# Patient Record
Sex: Female | Born: 1975 | Hispanic: Yes | Marital: Married | Smoking: Never smoker
Health system: Southern US, Community
[De-identification: ages and names within clinical notes are randomized; demographics above are authoritative.]

## PROBLEM LIST (undated history)

## (undated) ENCOUNTER — Inpatient Hospital Stay (HOSPITAL_COMMUNITY): Payer: Self-pay

## (undated) DIAGNOSIS — T7840XA Allergy, unspecified, initial encounter: Secondary | ICD-10-CM

## (undated) DIAGNOSIS — N883 Incompetence of cervix uteri: Secondary | ICD-10-CM

## (undated) DIAGNOSIS — K589 Irritable bowel syndrome without diarrhea: Secondary | ICD-10-CM

## (undated) DIAGNOSIS — M1990 Unspecified osteoarthritis, unspecified site: Secondary | ICD-10-CM

## (undated) DIAGNOSIS — E282 Polycystic ovarian syndrome: Secondary | ICD-10-CM

## (undated) DIAGNOSIS — D649 Anemia, unspecified: Secondary | ICD-10-CM

## (undated) DIAGNOSIS — H8110 Benign paroxysmal vertigo, unspecified ear: Secondary | ICD-10-CM

## (undated) DIAGNOSIS — N979 Female infertility, unspecified: Secondary | ICD-10-CM

## (undated) DIAGNOSIS — H209 Unspecified iridocyclitis: Secondary | ICD-10-CM

## (undated) DIAGNOSIS — E119 Type 2 diabetes mellitus without complications: Secondary | ICD-10-CM

## (undated) DIAGNOSIS — I10 Essential (primary) hypertension: Secondary | ICD-10-CM

## (undated) DIAGNOSIS — E039 Hypothyroidism, unspecified: Secondary | ICD-10-CM

## (undated) DIAGNOSIS — F329 Major depressive disorder, single episode, unspecified: Secondary | ICD-10-CM

## (undated) DIAGNOSIS — K7689 Other specified diseases of liver: Secondary | ICD-10-CM

## (undated) DIAGNOSIS — F3289 Other specified depressive episodes: Secondary | ICD-10-CM

## (undated) HISTORY — DX: Irritable bowel syndrome, unspecified: K58.9

## (undated) HISTORY — DX: Benign paroxysmal vertigo, unspecified ear: H81.10

## (undated) HISTORY — DX: Essential (primary) hypertension: I10

## (undated) HISTORY — DX: Allergy, unspecified, initial encounter: T78.40XA

## (undated) HISTORY — DX: Hypothyroidism, unspecified: E03.9

## (undated) HISTORY — DX: Unspecified iridocyclitis: H20.9

## (undated) HISTORY — DX: Other specified diseases of liver: K76.89

## (undated) HISTORY — DX: Unspecified osteoarthritis, unspecified site: M19.90

## (undated) HISTORY — DX: Major depressive disorder, single episode, unspecified: F32.9

## (undated) HISTORY — DX: Type 2 diabetes mellitus without complications: E11.9

## (undated) HISTORY — DX: Anemia, unspecified: D64.9

## (undated) HISTORY — DX: Other specified depressive episodes: F32.89

---

## 2005-04-21 ENCOUNTER — Ambulatory Visit: Payer: Self-pay | Admitting: Obstetrics and Gynecology

## 2005-04-22 ENCOUNTER — Ambulatory Visit (HOSPITAL_COMMUNITY): Admission: RE | Admit: 2005-04-22 | Discharge: 2005-04-22 | Payer: Self-pay | Admitting: *Deleted

## 2007-01-26 HISTORY — PX: CHOLECYSTECTOMY: SHX55

## 2007-03-04 ENCOUNTER — Emergency Department (HOSPITAL_COMMUNITY): Admission: EM | Admit: 2007-03-04 | Discharge: 2007-03-04 | Payer: Self-pay | Admitting: Emergency Medicine

## 2007-03-07 ENCOUNTER — Emergency Department (HOSPITAL_COMMUNITY): Admission: EM | Admit: 2007-03-07 | Discharge: 2007-03-07 | Payer: Self-pay | Admitting: Emergency Medicine

## 2007-03-09 ENCOUNTER — Encounter (INDEPENDENT_AMBULATORY_CARE_PROVIDER_SITE_OTHER): Payer: Self-pay | Admitting: Surgery

## 2007-03-09 ENCOUNTER — Ambulatory Visit (HOSPITAL_COMMUNITY): Admission: RE | Admit: 2007-03-09 | Discharge: 2007-03-10 | Payer: Self-pay | Admitting: Surgery

## 2007-10-30 LAB — CONVERTED CEMR LAB: Pap Smear: NEGATIVE

## 2008-01-11 ENCOUNTER — Ambulatory Visit (HOSPITAL_COMMUNITY): Admission: RE | Admit: 2008-01-11 | Discharge: 2008-01-11 | Payer: Self-pay | Admitting: Family Medicine

## 2008-02-08 ENCOUNTER — Ambulatory Visit (HOSPITAL_COMMUNITY): Admission: RE | Admit: 2008-02-08 | Discharge: 2008-02-08 | Payer: Self-pay | Admitting: Family Medicine

## 2008-02-24 ENCOUNTER — Encounter: Payer: Self-pay | Admitting: Internal Medicine

## 2008-02-24 ENCOUNTER — Ambulatory Visit: Payer: Self-pay | Admitting: Advanced Practice Midwife

## 2008-02-24 ENCOUNTER — Inpatient Hospital Stay (HOSPITAL_COMMUNITY): Admission: AD | Admit: 2008-02-24 | Discharge: 2008-03-03 | Payer: Self-pay | Admitting: Family Medicine

## 2008-02-24 ENCOUNTER — Ambulatory Visit: Payer: Self-pay | Admitting: Obstetrics & Gynecology

## 2008-02-24 DIAGNOSIS — N883 Incompetence of cervix uteri: Secondary | ICD-10-CM | POA: Insufficient documentation

## 2008-02-24 LAB — CONVERTED CEMR LAB
Hemoglobin: 11.8 g/dL
MCV: 82 fL
Platelets: 203 10*3/uL
WBC: 9.6 10*3/uL

## 2008-03-02 ENCOUNTER — Encounter: Payer: Self-pay | Admitting: Internal Medicine

## 2008-03-02 ENCOUNTER — Encounter: Payer: Self-pay | Admitting: Obstetrics & Gynecology

## 2008-03-02 LAB — CONVERTED CEMR LAB
MCV: 84 fL
Platelets: 192 10*3/uL
RDW: 13.9 %
WBC: 19.1 10*3/uL

## 2008-03-03 ENCOUNTER — Encounter: Payer: Self-pay | Admitting: Internal Medicine

## 2008-03-03 LAB — CONVERTED CEMR LAB
MCV: 83.5 fL
RBC: 2.8 M/uL
RDW: 14.6 %

## 2008-05-20 ENCOUNTER — Emergency Department (HOSPITAL_COMMUNITY): Admission: EM | Admit: 2008-05-20 | Discharge: 2008-05-20 | Payer: Self-pay | Admitting: Emergency Medicine

## 2008-09-03 LAB — HM MAMMOGRAPHY: HM Mammogram: NORMAL

## 2008-09-25 ENCOUNTER — Encounter: Payer: Self-pay | Admitting: Internal Medicine

## 2008-09-26 ENCOUNTER — Ambulatory Visit: Payer: Self-pay | Admitting: Internal Medicine

## 2008-09-26 DIAGNOSIS — K5909 Other constipation: Secondary | ICD-10-CM

## 2008-09-26 DIAGNOSIS — K921 Melena: Secondary | ICD-10-CM

## 2008-09-26 DIAGNOSIS — D649 Anemia, unspecified: Secondary | ICD-10-CM

## 2008-09-26 DIAGNOSIS — Z9189 Other specified personal risk factors, not elsewhere classified: Secondary | ICD-10-CM | POA: Insufficient documentation

## 2008-09-26 DIAGNOSIS — F329 Major depressive disorder, single episode, unspecified: Secondary | ICD-10-CM

## 2008-10-01 ENCOUNTER — Encounter (INDEPENDENT_AMBULATORY_CARE_PROVIDER_SITE_OTHER): Payer: Self-pay | Admitting: *Deleted

## 2008-10-01 LAB — CONVERTED CEMR LAB
Basophils Relative: 0.7 % (ref 0.0–3.0)
CO2: 28 meq/L (ref 19–32)
Calcium: 9.2 mg/dL (ref 8.4–10.5)
Chloride: 108 meq/L (ref 96–112)
Creatinine, Ser: 0.6 mg/dL (ref 0.4–1.2)
Eosinophils Absolute: 0.1 10*3/uL (ref 0.0–0.7)
Eosinophils Relative: 1 % (ref 0.0–5.0)
Glucose, Bld: 85 mg/dL (ref 70–99)
Hemoglobin: 12 g/dL (ref 12.0–15.0)
Lymphocytes Relative: 24 % (ref 12.0–46.0)
MCHC: 34 g/dL (ref 30.0–36.0)
Monocytes Relative: 10.2 % (ref 3.0–12.0)
Neutro Abs: 4.6 10*3/uL (ref 1.4–7.7)
Neutrophils Relative %: 64.1 % (ref 43.0–77.0)
Potassium: 3.8 meq/L (ref 3.5–5.1)
RDW: 13.2 % (ref 11.5–14.6)

## 2008-12-12 ENCOUNTER — Ambulatory Visit (HOSPITAL_COMMUNITY): Admission: RE | Admit: 2008-12-12 | Discharge: 2008-12-12 | Payer: Self-pay | Admitting: Ophthalmology

## 2009-01-11 IMAGING — US US ABDOMEN COMPLETE
1 series · 13 of 25 positions shown · non-contrast
Comparison: Plain film 03/04/2007.

CLINICAL DATA: Abdominal and back pain.

ABDOMEN ULTRASOUND
TECHNIQUE: Complete abdominal ultrasound examination was performed including
evaluation of the liver, gallbladder, bile ducts, pancreas, kidneys, spleen,
IVC, and abdominal aorta.

[Series 1: unknown · 0.28mm/px · 13 of 67 slices shown]
[im 1/67]
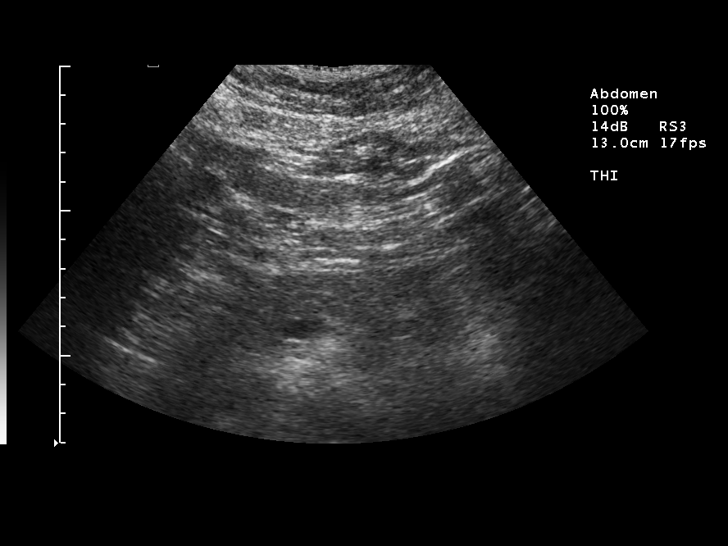
[im 6/67]
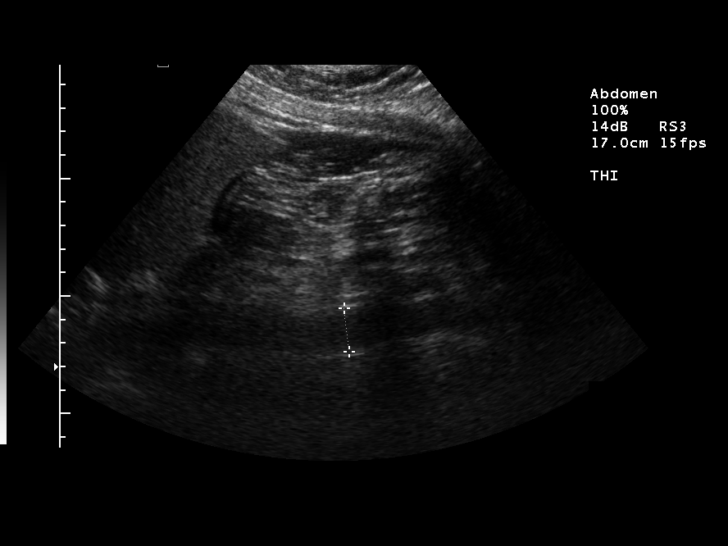
[im 12/67]
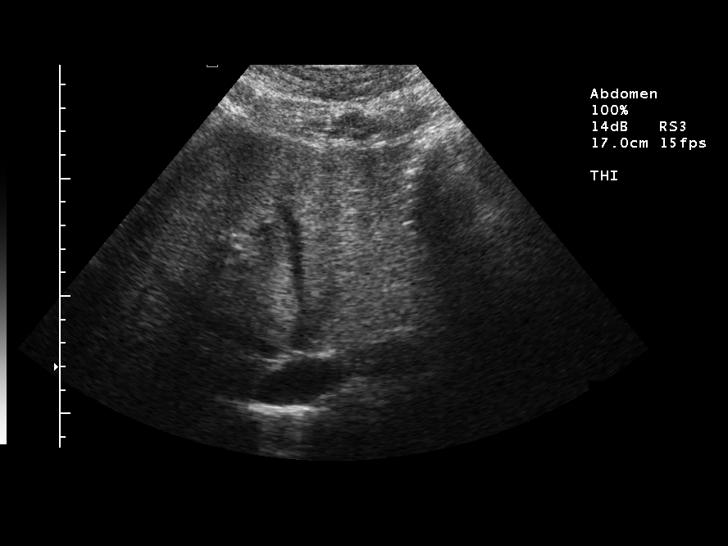
[im 17/67]
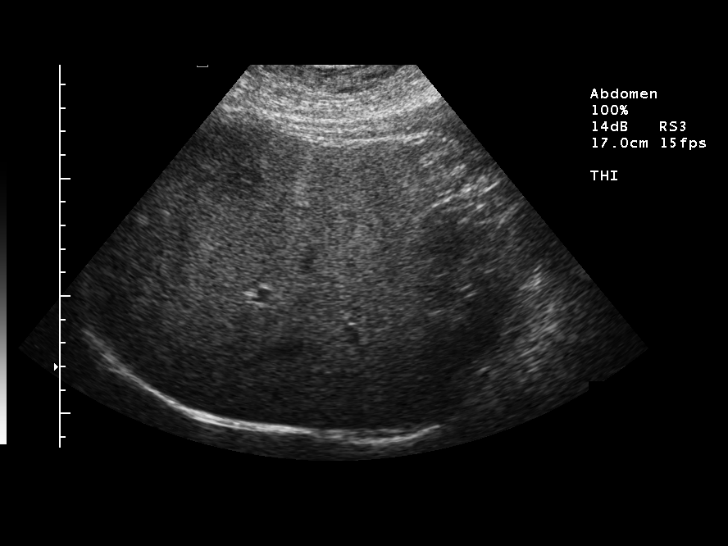
[im 23/67]
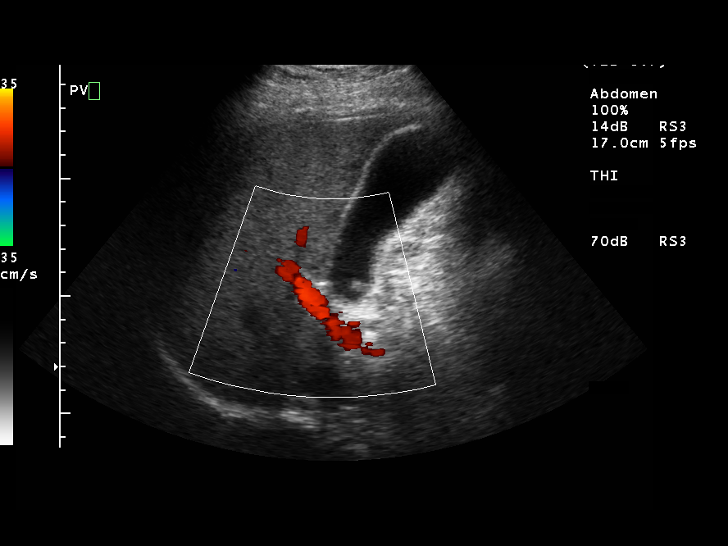
[im 28/67]
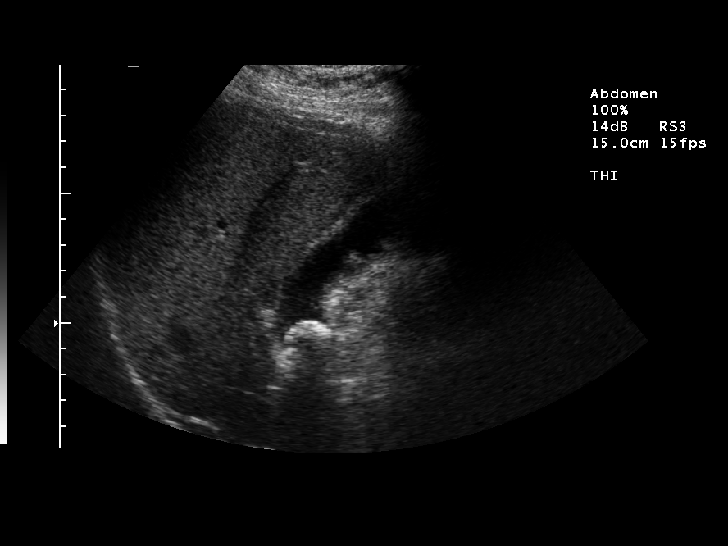
[im 34/67]
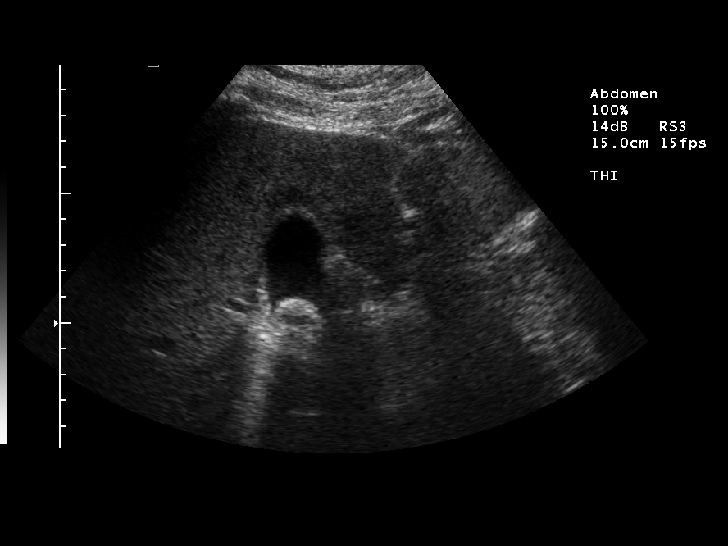
[im 39/67]
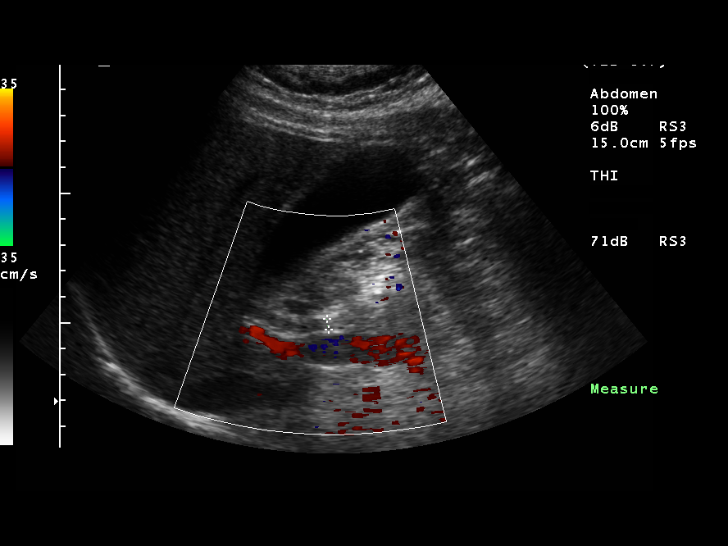
[im 45/67]
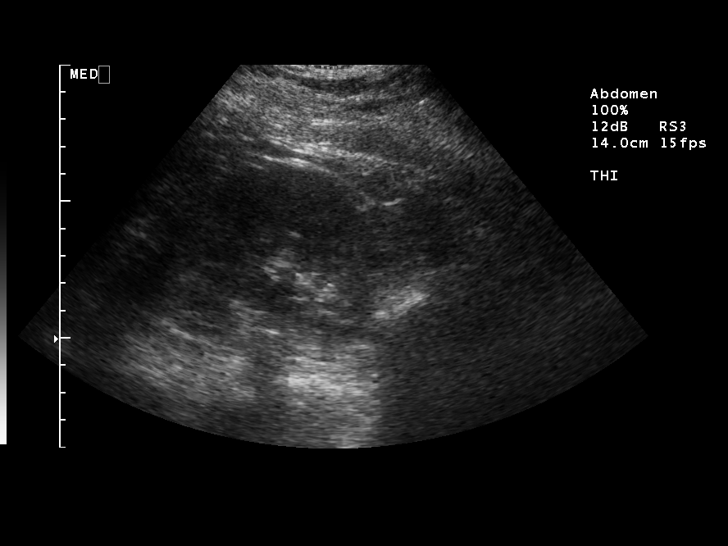
[im 50/67]
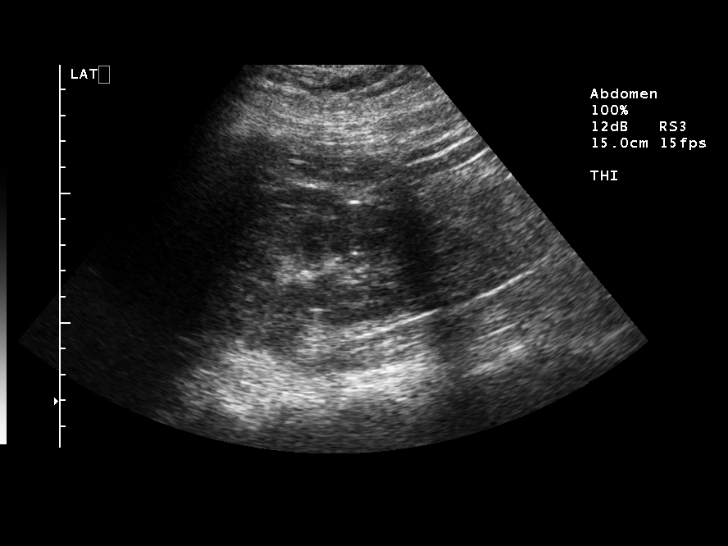
[im 56/67]
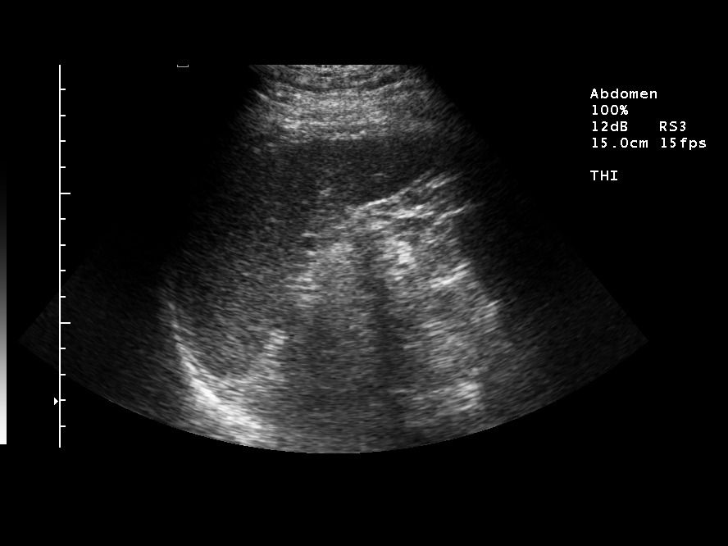
[im 61/67]
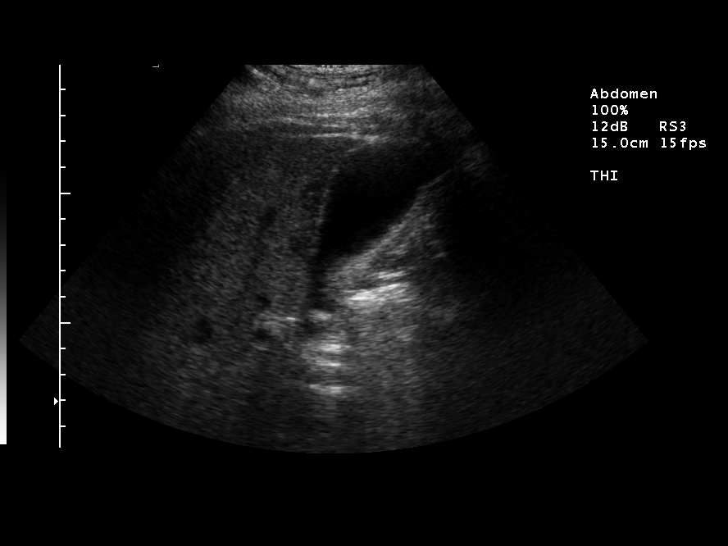
[im 67/67]
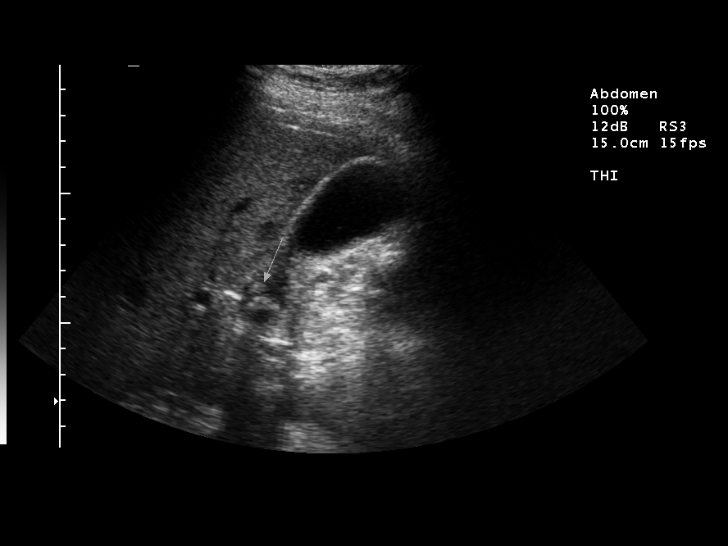

[13 of 25 positions shown; findings below may reference images not displayed]

FINDINGS: gallstones. Mild gallbladder wall thickening at 4 mm. No
pericholecystic fluid. Unable to evaluate for sonographic Murphy's sign due to
premedication.

Common duct normal at 4 mm. 

Increased echogenicity of the liver likely relates to fatty infiltration. 1.8 cm
right liver lobe relative hypoechogenicity is likely fat sparing. 

IVC, pancreas, spleen normal.

Right kidney 10.4 and left kidney 10.8 cm. No hydronephrosis.

Abdominal aorta non-aneurysmal without ascites.

IMPRESSION

1. Cholelithiasis with mild gallbladder wall thickening. No specific evidence of
acute cholecystitis. Limited for evaluation of sonographic Murphy's sign due to
premedication.
2. Probable fatty infiltration of the liver. 1.8 cm Relative hypoechogenicity is
likely related to fat sparing. Correlate with any history of primary malignancy
or liver dysfunction. If there is any such history, nonemergent hepatic MR
recommended.

## 2009-01-13 IMAGING — RF DG CHOLANGIOGRAM OPERATIVE
1 series · 4 of 4 positions shown · non-contrast
Comparison: Ultrasound, 03/07/07.

CLINICAL DATA: 31 year old female with gallstones.  
 INTRAOPERATIVE CHOLANGIOGRAM ? 03/09/07:

[Series 1: run · 4 of 111 frames shown]
[frame 17/111]
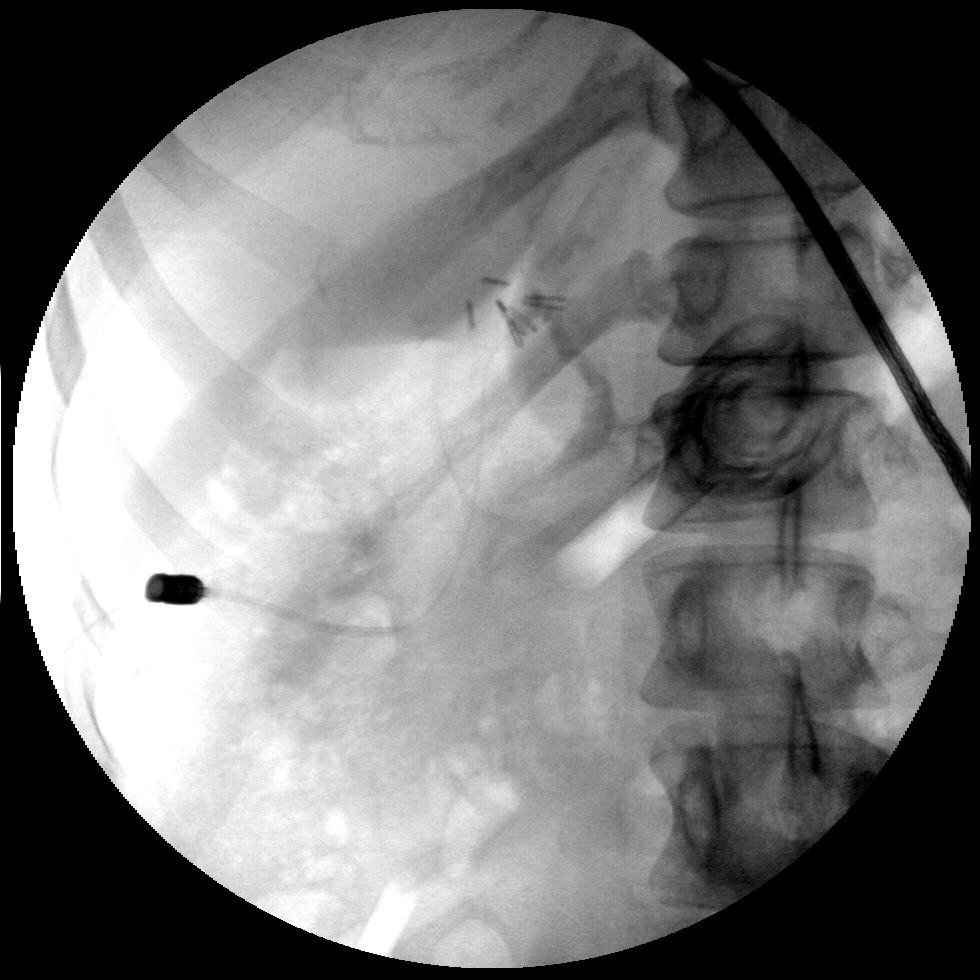
[frame 56/111]
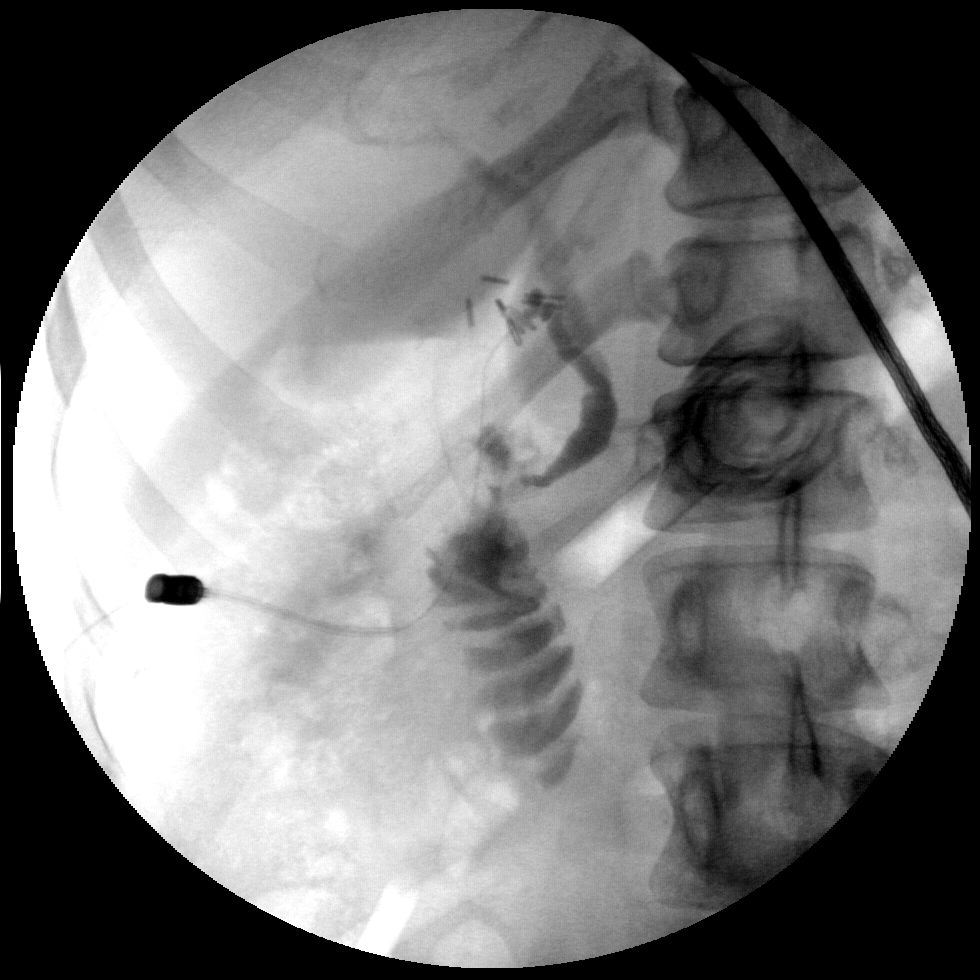
[frame 95/111]
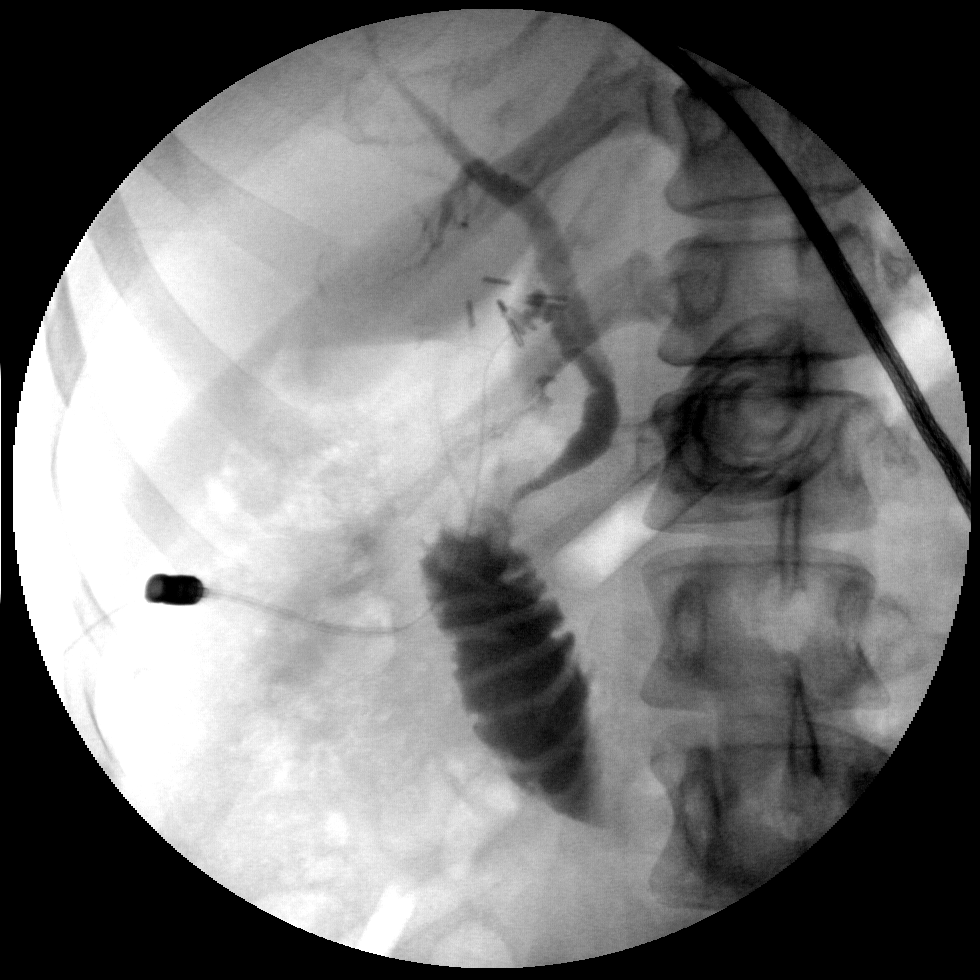
[frame 98/111]
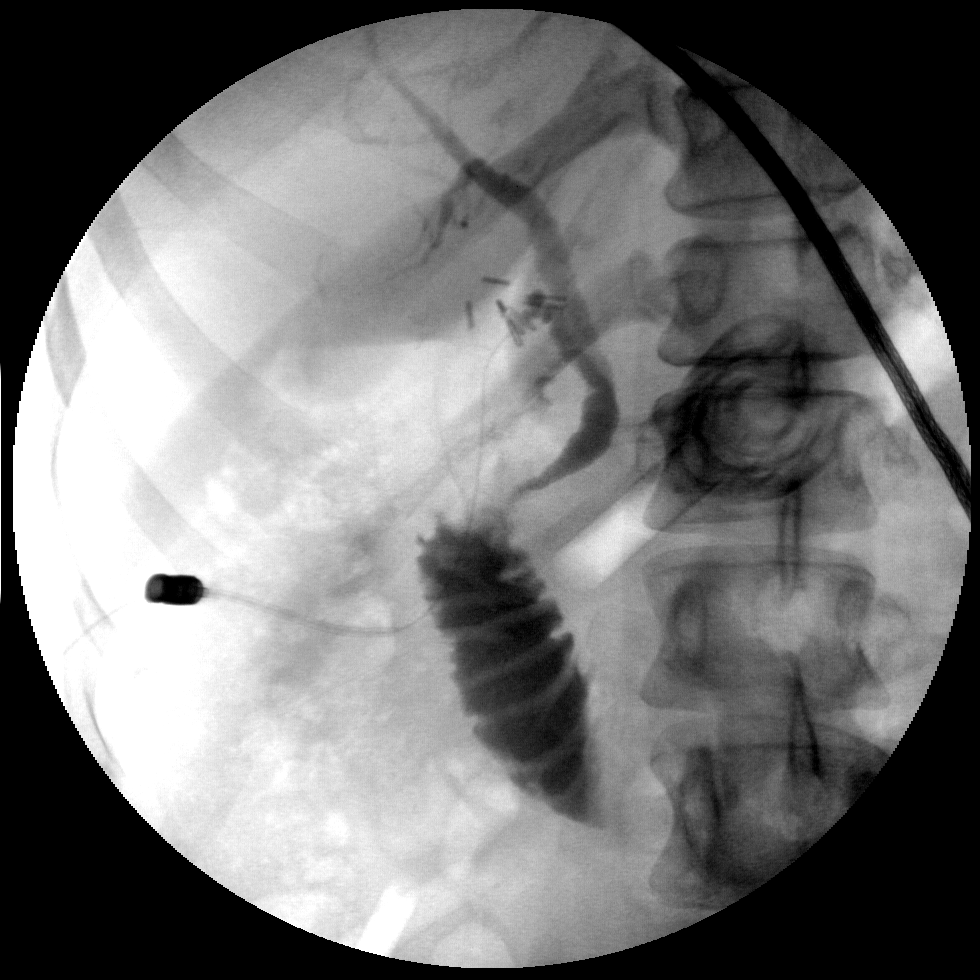

[4 of 4 positions shown; findings below may reference images not displayed]

FINDINGS: Multiple fluoroscopic spot films of the right upper quadrant.  There is cannulation of the cystic duct in the region of multiple surgical clips.  Injection of contrast ensues opacifying a mildly dilated common bile duct.  There is immediate transit of contrast to the duodenum.  The proximal common bile duct and intrahepatic ducts are then slowly opacified.  No definite filling defect is identified. The intrahepatic ducts are nondilated.  A slightly irregular appearance of the intrahepatic ducts may be due to incomplete distention.
IMPRESSION: Mildly dilated common bile duct with no definite filling defect.  No obstruction to the forward flow of contrast to the duodenum.

## 2009-03-07 ENCOUNTER — Ambulatory Visit: Payer: Self-pay | Admitting: Internal Medicine

## 2009-03-07 DIAGNOSIS — R1031 Right lower quadrant pain: Secondary | ICD-10-CM | POA: Insufficient documentation

## 2009-03-07 DIAGNOSIS — H209 Unspecified iridocyclitis: Secondary | ICD-10-CM

## 2009-03-07 LAB — CONVERTED CEMR LAB
Basophils Absolute: 0 10*3/uL (ref 0.0–0.1)
Basophils Relative: 0.4 % (ref 0.0–3.0)
Beta hcg, urine, semiquantitative: NEGATIVE
Bilirubin Urine: NEGATIVE
Eosinophils Absolute: 0.1 10*3/uL (ref 0.0–0.7)
Eosinophils Relative: 1.7 % (ref 0.0–5.0)
Glucose, Urine, Semiquant: NEGATIVE
HCT: 37.8 % (ref 36.0–46.0)
Hemoglobin: 12.4 g/dL (ref 12.0–15.0)
Leukocytes, UA: NEGATIVE
Lymphs Abs: 1.9 10*3/uL (ref 0.7–4.0)
MCHC: 32.9 g/dL (ref 30.0–36.0)
MCV: 80 fL (ref 78.0–100.0)
Neutro Abs: 3.3 10*3/uL (ref 1.4–7.7)
RBC: 4.72 M/uL (ref 3.87–5.11)
Specific Gravity, Urine: <1.005
Urobilinogen, UA: 0.2
Urobilinogen, UA: 0.2 (ref 0.0–1.0)
WBC: 5.7 10*3/uL (ref 4.5–10.5)
pH: 6 (ref 5.0–8.0)

## 2009-03-08 LAB — CONVERTED CEMR LAB: Chlamydia, Swab/Urine, PCR: NEGATIVE

## 2009-03-21 ENCOUNTER — Emergency Department (HOSPITAL_COMMUNITY): Admission: EM | Admit: 2009-03-21 | Discharge: 2009-03-22 | Payer: Self-pay | Admitting: Emergency Medicine

## 2009-03-23 ENCOUNTER — Emergency Department (HOSPITAL_COMMUNITY): Admission: EM | Admit: 2009-03-23 | Discharge: 2009-03-24 | Payer: Self-pay | Admitting: Emergency Medicine

## 2009-03-24 LAB — CONVERTED CEMR LAB
ALT: 130 units/L
BUN: 5 mg/dL
Basophils Relative: 1 %
Calcium: 9 mg/dL
Creatinine, Ser: 0.58 mg/dL
HCT: 37.7 %
Hemoglobin: 12.9 g/dL
Lymphocytes, automated: 27 %
Neutrophils Relative %: 60 %
Potassium: 3.7 meq/L
Sodium: 138 meq/L
Total Bilirubin: 0.4 mg/dL
Total Protein: 7.9 g/dL
WBC: 5.3 10*3/uL

## 2009-03-31 ENCOUNTER — Ambulatory Visit: Payer: Self-pay | Admitting: Internal Medicine

## 2009-03-31 DIAGNOSIS — K589 Irritable bowel syndrome without diarrhea: Secondary | ICD-10-CM

## 2009-03-31 DIAGNOSIS — K7689 Other specified diseases of liver: Secondary | ICD-10-CM

## 2009-03-31 DIAGNOSIS — K760 Fatty (change of) liver, not elsewhere classified: Secondary | ICD-10-CM | POA: Insufficient documentation

## 2009-04-02 ENCOUNTER — Encounter: Payer: Self-pay | Admitting: Internal Medicine

## 2009-04-02 LAB — CONVERTED CEMR LAB
ALT: 84 units/L
AST: 49 units/L

## 2009-04-11 ENCOUNTER — Encounter: Payer: Self-pay | Admitting: Internal Medicine

## 2009-04-11 LAB — CONVERTED CEMR LAB
ALT: 80 units/L
AST: 50 units/L
Alkaline Phosphatase: 108 units/L
Calcium: 9.9 mg/dL
Chloride: 103 meq/L
Hgb A1c MFr Bld: 6 %
LDL Cholesterol: 89 mg/dL
Potassium: 4.6 meq/L
TSH: 7.36 microintl units/mL
Total Bilirubin: 0.5 mg/dL
Triglyceride fasting, serum: 119 mg/dL

## 2009-04-18 ENCOUNTER — Encounter: Payer: Self-pay | Admitting: Internal Medicine

## 2009-12-10 ENCOUNTER — Ambulatory Visit: Payer: Self-pay | Admitting: Internal Medicine

## 2009-12-10 DIAGNOSIS — E039 Hypothyroidism, unspecified: Secondary | ICD-10-CM | POA: Insufficient documentation

## 2009-12-10 DIAGNOSIS — E119 Type 2 diabetes mellitus without complications: Secondary | ICD-10-CM

## 2009-12-10 DIAGNOSIS — H8110 Benign paroxysmal vertigo, unspecified ear: Secondary | ICD-10-CM

## 2009-12-10 DIAGNOSIS — E1165 Type 2 diabetes mellitus with hyperglycemia: Secondary | ICD-10-CM | POA: Insufficient documentation

## 2009-12-10 LAB — CONVERTED CEMR LAB: Blood Glucose, Fasting: 98 mg/dL

## 2010-02-24 NOTE — Assessment & Plan Note (Signed)
Summary: R SIDE STOMACH PAIN-CLOSE TO NAVEL--STC   Vital Signs:  Patient profile:   35 year old female Height:      63.5 inches (161.29 cm) Weight:      202.8 pounds (92.18 kg) O2 Sat:      98 % on Room air Temp:     98.7 degrees F (37.06 degrees C) oral Pulse rate:   80 / minute BP sitting:   110 / 72  (left arm) Cuff size:   large  Vitals Entered By: Orlan Leavens (March 07, 2009 10:09 AM)  O2 Flow:  Room air CC: (R) stomach pain x's 2 days. Pt describe feeling as a burning sensation Is Patient Diabetic? No Pain Assessment Patient in pain? yes     Location: (R) side of stomach Type: burning sensation   Primary Care Provider:  Newt Lukes MD  CC:  (R) stomach pain x's 2 days. Pt describe feeling as a burning sensation.  History of Present Illness: here today with complaint of RLQ pain. onset of symptoms was 3 days ago. course has been sudden onset and now occurs in constant pattern. pain is 4-5/10, not changing since onset symptom characterized as pinching and cramp sensation symptom radiates to no where - not to back, suprapubic region or left side of pelvis problem associated with spotting (LMP 01/18/09 - but pt states this is usual for her irregular cycle); not associated with n/v, or diarrhea - last BM 3 days ago . symptoms improved by nothing - not changed by position or activity. symptoms worsened with nothing. no prior hx of same symptoms.   also reports + tx for iritis siince last OV here -  eye doc = [facility] pain in left iris has "95%" improved when last checked on 02/18/09 ?needs to be checked for arthritis  Current Medications (verified): 1)  Multivitamins  Tabs (Multiple Vitamin) .... Take 1 By Mouth Qd 2)  Calcium 500 Mg Tabs (Calcium Carbonate) .... Take 1 By Mouth Qd 3)  Senokot S 8.6-50 Mg Tabs (Sennosides-Docusate Sodium) .Marland Kitchen.. 1 By Mouth At Bedtime 4)  Analpram E 2.5-1 & 1 % Kit (Hydrocortisone Ace-Pramoxine) .... Use Rectally As Needed  For Pain or Bleeding  Allergies (verified): No Known Drug Allergies  Past History:  Past Medical History: Status post pregnancy - loss at [redacted] weeks gestation 02/2008 Anemia-NOS Depression  Social History: Reviewed history from 09/26/2008 and no changes required. 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    Physical Exam  General:  alert, well-developed, well-nourished, and cooperative to examination.  spouse at side Eyes:  vision grossly intact; pupils equal, round and reactive to light.  conjunctiva and lids normal.    Lungs:  normal respiratory effort, no intercostal retractions or use of accessory muscles; normal breath sounds bilaterally - no crackles and no wheezes.    Heart:  normal rate, regular rhythm, no murmur, and no rub. BLE without edema. Abdomen:  soft, non-tender, normal bowel sounds, no distention; no masses and no appreciable hepatomegaly or splenomegaly.  -no reproducible discomfort with deep palpation over area of indicated pain in RLQ Msk:  no joint effusions or inflammation at hands, wrists, elbows, knees or feet/toes - no deformities   Impression & Recommendations:  Problem # 1:  ABDOMINAL PAIN, RIGHT  LOWER QUADRANT (ICD-789.03) as LMP>6 weeks, check UPT r/o pregnancy - negative also UA for ?UTI - negative ?possible ovulation causing discomfort - also r/o GC/chlam (esp with iritis hx -see next) not sig pain on exam and nontoxic/afeb - no vomitting ?constipation - rec tx of same add mobic for antiinflam pain mgmt and check labs r/o elev WBC, anemia - no abx indicated at this time if cont pain or if symptoms become worse (such as fever or inc intenstiy), consider need for CT or US imaging to  further eval Orders: T-Chlamydia  Probe, urine [phone]) T-GC Probe, urine ([id number]) TLB-Udip ONLY (81003-UDIP) Urine Pregnancy Test  (30865)  Problem # 2:  IRITIS (ICD-364.3) dx by optho in past few mos - ?TB or sarcoid - clinically no other symptoms so hold on CXR or PPD at this time check RPR and urine for chlam as related reiter's syndrome (though no arthritis evident on  exam or hx of joint pains) check CBC  Orders: TLB-CBC Platelet - w/Differential (85025-[id number]) T-Chlamydia  Probe, urine ([id number]) T-RPR (Syphilis) ([id number])  Complete Medication List: 1)  Multivitamins Tabs (Multiple vitamin) .... Take 1 by mouth qd 2)  Calcium 500 Mg Tabs (Calcium carbonate) .... Take 1 by mouth qd 3)  Senokot S 8.6-50 Mg Tabs (Sennosides-docusate sodium) .Marland Kitchen.. 1 by mouth at bedtime 4)  Analpram E 2.5-1 & 1 % Kit (Hydrocortisone ace-pramoxine) .... Use rectally as needed for pain or bleeding 5)  Mobic 15 Mg Tabs (Meloxicam) .Marland Kitchen.. 1 by mouth once daily as needed for pain  Patient Instructions: 1)  it was good to see you today.  2)  urine tests negative for infection or pregnancy today 3)  test(s) ordered today for pain and ?arthritis testing- your results will be posted on the phone tree for review in 48-72 hours from the time of test completion; call [phone] and enter your 9 digit MRN (listed above on this page, just below your name); if any changes need to be made or there are abnormal results, you will be contacted directly.  4)  for pain, try Mobic - your prescription has been electronically submitted to your pharmacy. Please take as directed. Contact our office if you believe you're having problems with the medication(s). 5)  also take Miralax or other stool softener for your constipation until +bowel movement occurs  6)  if fever or increasing pain despite having bowel movement, call and we will order other testing Prescriptions: MOBIC 15 MG TABS (MELOXICAM) 1 by mouth  once daily as needed for pain  #30 x 0   Entered and Authorized by:   Newt Lukes MD   Signed by:   Newt Lukes MD on 03/07/2009   Method used:   Electronically to        [facility] [phone]* (retail)       [address]       [place], [place]  [address]       Ph: [phone]       Fax: [phone]   RxID:   [id number]   Laboratory Results   Urine Tests    Routine Urinalysis   Color: yellow Appearance: Clear Glucose: negative   (Normal Range: Negative) Bilirubin: negative   (Normal Range: Negative) Ketone: negative   (Normal Range: Negative) Spec. Gravity: <1.005   (Normal Range: 1.003-1.035) Blood: moderate   (Normal Range: Negative) pH: 5.0   (Normal Range: 5.0-8.0) Protein: negative   (Normal Range: Negative) Urobilinogen: 0.2   (Normal Range:  0-1) Nitrite: negative   (Normal Range: Negative) Leukocyte Esterace: negative   (Normal Range: Negative)    Urine HCG: negative

## 2010-02-24 NOTE — Consult Note (Signed)
Summary: [facility]   [facility]   Imported By: Sherian Rein 04/18/2009 10:31:22  _____________________________________________________________________  External Attachment:    Type:   Image     Comment:   External Document

## 2010-02-24 NOTE — Assessment & Plan Note (Signed)
Summary: post ER @ [facility]/per pt/#/cd   Vital Signs:  Patient profile:   35 year old female Height:      63.5 inches (161.29 cm) Weight:      202 pounds (91.82 kg) O2 Sat:      99 % on Room air Temp:     98.3 degrees F (36.83 degrees C) oral Pulse rate:   80 / minute BP sitting:   102 / 72  (left arm) Cuff size:   large  Vitals Entered By: Orlan Leavens (March 31, 2009 1:09 PM)  O2 Flow:  Room air CC: ER F/U Is Patient Diabetic? No Pain Assessment Patient in pain? no        Primary Care Provider:  Newt Lukes MD  CC:  ER F/U.  History of Present Illness: here for followup on abd pain currently pain is 1/10 seen in [facility] ER 2/25 and 2/28 for RLQ pain -  multiple imaging tests (CT a/p, abd [place] and pelvic US) done - all negative for acute prob - +fatty liver changes (pt says it is "always" been that way) -  reports negative hepatitis when checked at pregnancy last year still with sharp episodes of pain in RLQ, esp with spicy or fatty foods - tring to change diet to avoid these foods - feels better since taking doxy (for emperic tx of ?PID though CG/cham neg x 3 separate tests) concerned about pain as wishes to become pregnant again - has never seen GI -nor f/u with ob-gyn since loss of pregnancy 02/2008  Clinical Review Panels:  Prevention   Last Mammogram:  No specific mammographic evidence of malignancy.   (09/03/2008)   Last Pap Smear:  Interpretation/Result:Negative for intraepithelial Lesion or Malignancy.    (10/30/2007)  Immunizations   Last Tetanus Booster:  Historical (04/25/2008)  CBC   WBC:  5.3 (03/24/2009)   RBC:  4.74 (03/24/2009)   Hgb:  12.9 (03/24/2009)   Hct:  37.7 (03/24/2009)   Platelets:  207 (03/24/2009)   MCV  79.6 (03/24/2009)   MCHC  32.9 (03/07/2009)   RDW  15.3 (03/24/2009)   PMN:  60 (03/24/2009)   Lymphs:  33.8 (03/07/2009)   Monos:  10 (03/24/2009)   Eosinophils:  2 (03/24/2009)   Basophil:  1 (03/24/2009)  Complete  Metabolic Panel   Glucose:  99 (03/24/2009)   Sodium:  138 (03/24/2009)   Potassium:  3.7 (03/24/2009)   Chloride:  107 (03/24/2009)   CO2:  24 (03/24/2009)   BUN:  5 (03/24/2009)   Creatinine:  0.58 (03/24/2009)   Albumin:  3.9 (03/24/2009)   Total Protein:  7.9 (03/24/2009)   Calcium:  9.0 (03/24/2009)   Total Bili:  0.4 (03/24/2009)   Alk Phos:  120 (03/24/2009)   SGPT (ALT):  130 (03/24/2009)   SGOT (AST):  60 (03/24/2009)   -  Date:  03/24/2009    WBC: 5.3    HGB: 12.9    HCT: 37.7    RBC: 4.74    PLT: 207    MCV: 79.6    RDW: 15.3    Neutrophil: 60    Lymphs: 27    Monos: 10    Eos: 2    Basophil: 1    BG Random: 99    BUN: 5    Creatinine: 0.58    Sodium: 138    Potassium: 3.7    Chloride: 107    CO2 Total: 24    SGOT (AST): 60  SGPT (ALT): 130    T. Bilirubin: 0.4    Alk Phos: 120    Calcium: 9.0    Total Protein: 7.9    Albumin: 3.9  Current Medications (verified): 1)  Multivitamins  Tabs (Multiple Vitamin) .... Take 1 By Mouth Qd 2)  Calcium 500 Mg Tabs (Calcium Carbonate) .... Take 1 By Mouth Qd 3)  Senokot S 8.6-50 Mg Tabs (Sennosides-Docusate Sodium) .Marland Kitchen.. 1 By Mouth At Bedtime 4)  Analpram E 2.5-1 & 1 % Kit (Hydrocortisone Ace-Pramoxine) .... Use Rectally As Needed For Pain or Bleeding 5)  Mobic 15 Mg Tabs (Meloxicam) .Marland Kitchen.. 1 By Mouth Once Daily As Needed For Pain 6)  Doxycycline Hyclate 100 Mg Caps (Doxycycline Hyclate) .... Take 1 Two Times A Day For 14 Days  Allergies (verified): No Known Drug Allergies  Past History:  Past Medical History: Status post pregnancy loss at [redacted] weeks gestation 02/2008 Anemia-NOS Depression  Review of Systems  The patient denies fever, chest pain, headaches, melena, hematochezia, and severe indigestion/heartburn.    Physical Exam  General:  alert, well-developed, well-nourished, and cooperative to examination.   Lungs:  normal respiratory effort, no intercostal retractions or use of accessory muscles;  normal breath sounds bilaterally - no crackles and no wheezes.    Heart:  normal rate, regular rhythm, no murmur, and no rub. BLE without edema. Abdomen:  soft, non-tender, normal bowel sounds, no distention; no masses and no appreciable hepatomegaly or splenomegaly.     Impression & Recommendations:  Problem # 1:  IRRITABLE BOWEL SYNDROME (ICD-564.1) ?if this is primary cause of her recurrent RLQ pain as all other testing has been begative -  i do not believe pt has PID with negative [place] and lab testing but ok to cont doxy as feels improved - refer to GI also to eval "fatty liver" with inc LFTs Orders: Gastroenterology Referral (GI)  Problem # 2:  FATTY LIVER DISEASE (ICD-571.8) see above Orders: Gastroenterology Referral (GI)  Problem # 3:  ABDOMINAL PAIN, RIGHT LOWER QUADRANT (ICD-789.03)  UPT repeatedly checked and negative also UA for ?UTI - negative ER eval x 2 negative for etiology but reports improved with doxy -  never with WBC -- ?IBS - see discussion above and refer to GI  Problem # 4:  CERVICAL INCOMPETENCE (ICD-622.5) hopes for pregnancy again in near future -  rec eval by ob-gyn given hx of pregnancy loss at 22wks last year - pt agrees, referral made Orders: Gynecologic Referral (Gyn)  Complete Medication List: 1)  Multivitamins Tabs (Multiple vitamin) .... Take 1 by mouth qd 2)  Calcium 500 Mg Tabs (Calcium carbonate) .... Take 1 by mouth qd 3)  Senokot S 8.6-50 Mg Tabs (Sennosides-docusate sodium) .Marland Kitchen.. 1 by mouth at bedtime 4)  Analpram E 2.5-1 & 1 % Kit (Hydrocortisone ace-pramoxine) .... Use rectally as needed for pain or bleeding 5)  Mobic 15 Mg Tabs (Meloxicam) .Marland Kitchen.. 1 by mouth once daily as needed for pain 6)  Doxycycline Hyclate 100 Mg Caps (Doxycycline hyclate) .... Take 1 two times a day for 14 days  Patient Instructions: 1)  it was good to see you today. 2)  complete the antibiotics you were prescribed - 3)  we'll make referral to GI doc and to  ob-gyn. Our office will contact you regarding these appointments once made.       MISC. Report  Procedure date:  03/24/2009  Findings:      Type of Report: transabdominal & transvaginal u/s Impression: No acute  findings. Small amount of free pelvicb fluid is compatible with physiologic change.

## 2010-02-24 NOTE — Assessment & Plan Note (Signed)
Summary: DIZZINESS--FEEL LIKE HEAD IS GOING TO BLOW UP-STC   Vital Signs:  Patient profile:   35 year old female Height:      63.5 inches (161.29 cm) Weight:      192.4 pounds (87.45 kg) BMI:     33.67 O2 Sat:      99 % on Room air Temp:     97.3 degrees F (36.28 degrees C) oral Pulse rate:   67 / minute BP sitting:   100 / 68  (left arm) Cuff size:   regular  Vitals Entered By: Orlan Leavens RMA (December 10, 2009 9:17 AM)  O2 Flow:  Room air CC: Dizziness x's 2 weeks Is Patient Diabetic? Yes Did you bring your meter with you today? No Comments Also been having nausea & vomitting. C/o swelling in her feet. Pt was dx diabetes taking new metformin & levothyroxine rx by gynecologist   Primary Care Provider:  Newt Lukes MD  CC:  Dizziness x's 2 weeks.  History of Present Illness: here for dizziness onset 2 weeks ago symptoms preceeded by head cold - deneis vision change, ear pain, mild HA "all over" - assoc with nasuea, no vomitting no fever, no weakness worse lying down and turning head or bending over no hx same  new dx DM2 since last OV - following with gyn for same - labs every 3 months per pt - reports compliance with ongoing medical treatment and no changes in medication dose or frequency. denies adverse side effects related to current therapy.   also new dx hypothyroid since last OV-  following with gyn for same - labs every 3 months per pt - reports compliance with ongoing medical treatment and no changes in medication dose or frequency. denies adverse side effects related to current therapy.   Clinical Review Panels:  Lipid Management   Cholesterol:  154 (04/11/2009)   LDL (bad choesterol):  89 (04/11/2009)   HDL (good cholesterol):  41 (04/11/2009)   Triglycerides:  119 (04/11/2009)  Diabetes Management   HgBA1C:  6.0 (04/11/2009)   Creatinine:  0.68 (04/11/2009)  CBC   WBC:  5.7 (04/11/2009)   RBC:  4.74 (03/24/2009)   Hgb:  12.6  (04/11/2009)   Hct:  37.7 (03/24/2009)   Platelets:  251 (04/11/2009)   MCV  79.6 (03/24/2009)   MCHC  32.9 (03/07/2009)   RDW  15.3 (03/24/2009)   PMN:  60 (03/24/2009)   Lymphs:  33.8 (03/07/2009)   Monos:  10 (03/24/2009)   Eosinophils:  2 (03/24/2009)   Basophil:  1 (03/24/2009)  Complete Metabolic Panel   Glucose:  105 (04/11/2009)   Sodium:  138 (04/11/2009)   Potassium:  4.6 (04/11/2009)   Chloride:  103 (04/11/2009)   CO2:  24 (04/11/2009)   BUN:  10 (04/11/2009)   Creatinine:  0.68 (04/11/2009)   Albumin:  3.9 (03/24/2009)   Total Protein:  7.9 (03/24/2009)   Calcium:  9.9 (04/11/2009)   Total Bili:  0.5 (04/11/2009)   Alk Phos:  108 (04/11/2009)   SGPT (ALT):  80 (04/11/2009)   SGOT (AST):  50 (04/11/2009)   Current Medications (verified): 1)  Multivitamins  Tabs (Multiple Vitamin) .... Take 1 By Mouth Qd 2)  Senokot S 8.6-50 Mg Tabs (Sennosides-Docusate Sodium) .Marland Kitchen.. 1 By Mouth At Bedtime 3)  Mobic 15 Mg Tabs (Meloxicam) .Marland Kitchen.. 1 By Mouth Once Daily As Needed For Pain 4)  Metformin Hcl 750 Mg Xr24h-Tab (Metformin Hcl) .... Take 1 Two Times A Day 5)  Levothyroxine Sodium 50 Mcg Tabs (Levothyroxine Sodium) .... Take 1 By Mouth Once Daily  Allergies (verified): No Known Drug Allergies  Past History:  Past Medical History: Status post pregnancy loss at [redacted] weeks gestation 02/2008 Anemia-NOS Depression Diabetes mellitus, type II Hypothyroidism  MD roster - gyn - fogelman  Review of Systems  The patient denies anorexia, weight gain, vision loss, decreased hearing, hoarseness, chest pain, syncope, and peripheral edema.    Physical Exam  General:  alert, well-developed, well-nourished, and cooperative to examination.  spouse at side Eyes:  vision grossly intact; pupils equal, round and reactive to light.  conjunctiva and lids normal. no nystagmus at rest   Ears:  normal pinnae bilaterally, without erythema, swelling, or tenderness to palpation. TMs hazy, min  effusion, no cerumen impaction. Hearing grossly normal bilaterally  Mouth:  teeth and gums in good repair; mucous membranes moist, without lesions or ulcers. oropharynx clear without exudate, no erythema.  Neck:  supple, full ROM, no masses, no thyromegaly; no thyroid nodules or tenderness. no JVD or carotid bruits.   Lungs:  normal respiratory effort, no intercostal retractions or use of accessory muscles; normal breath sounds bilaterally - no crackles and no wheezes.    Heart:  normal rate, regular rhythm, no murmur, and no rub. BLE without edema. Neurologic:  alert & oriented X3 and cranial nerves II-XII symetrically intact.  strength normal in all extremities, sensation intact to light touch, and gait normal. speech fluent without dysarthria or aphasia; follows commands with good comprehension.    Impression & Recommendations:  Problem # 1:  BENIGN POSITIONAL VERTIGO (ICD-386.11)  Her updated medication list for this problem includes:    Meclizine Hcl 25 Mg Tabs (Meclizine hcl) .Marland Kitchen... 1 by mouth three times a day x 3 days, then as needed for dizzy symptoms  Demonstrated maneuvers to self-treat vertigo. Patient to call to be seen if no improvement in 10-14 days, sooner if worse.  consider refer to vestib rehab  Orders: Prescription Created Electronically [phone])  Problem # 2:  DIABETES MELLITUS, TYPE II (ICD-250.00) mgmt as ongoing by gyn - will help if needed Her updated medication list for this problem includes:    Metformin Hcl 750 Mg Xr24h-tab (Metformin hcl) .Marland Kitchen... Take 1 two times a day  Orders: Glucose, (CBG) [phone])  Labs Reviewed: Creat: 0.68 (04/11/2009)    Reviewed HgBA1c results: 6.0 (04/11/2009)  Problem # 3:  HYPOTHYROIDISM (ICD-244.9) mgmt per gyn - will help as needed  Her updated medication list for this problem includes:    Levothyroxine Sodium 50 Mcg Tabs (Levothyroxine sodium) .Marland Kitchen... Take 1 by mouth once daily  Labs Reviewed: TSH: 7.36 (04/11/2009)     HgBA1c: 6.0 (04/11/2009) Chol: 154 (04/11/2009)   HDL: 41 (04/11/2009)   LDL: 89 (04/11/2009)   TG: 119 (04/11/2009)  Complete Medication List: 1)  Multivitamins Tabs (Multiple vitamin) .... Take 1 by mouth qd 2)  Senokot S 8.6-50 Mg Tabs (Sennosides-docusate sodium) .Marland Kitchen.. 1 by mouth at bedtime 3)  Mobic 15 Mg Tabs (Meloxicam) .Marland Kitchen.. 1 by mouth once daily as needed for pain 4)  Metformin Hcl 750 Mg Xr24h-tab (Metformin hcl) .... Take 1 two times a day 5)  Levothyroxine Sodium 50 Mcg Tabs (Levothyroxine sodium) .... Take 1 by mouth once daily 6)  Onetouch Ultra Blue Strp (Glucose blood) .... Use check blood sugar two times a day 7)  Onetouch Lancets Misc (Lancets) .... Use two times a day 8)  Meclizine Hcl 25 Mg Tabs (Meclizine hcl) .Marland KitchenMarland KitchenMarland Kitchen 1  by mouth three times a day x 3 days, then as needed for dizzy symptoms 9)  Prednisone (pak) 5 Mg Tabs (Prednisone) .... As directed x 6 days  Patient Instructions: 1)  it was good to see you today. 2)  for your dizzy symptoms - meclizine + pred pak - your prescriptions have been electronically submitted to your pharmacy. Please take as directed. Contact our office if you believe you're having problems with the medication(s).  3)  if still dizzy in 7-10days, call and we will refer to physical therapy for "vestibular rehab" 4)  if your gynecologist would like [place] to help manage yor diabetes or thyroid, please have their office send [place] records and labs - 5)  Please schedule a follow-up appointment in 6 months, call sooner if problems.  Prescriptions: PREDNISONE (PAK) 5 MG TABS (PREDNISONE) as directed x 6 days  #1pak x 0   Entered and Authorized by:   Newt Lukes MD   Signed by:   Newt Lukes MD on 12/10/2009   Method used:   Electronically to        [facility] [phone]* (retail)       [address]       [place], [place]  [address]       Ph: [phone]       Fax: [phone]   RxID:   [id number] MECLIZINE HCL 25 MG TABS  (MECLIZINE HCL) 1 by mouth three times a day x 3 days, then as needed for dizzy symptoms  #30 x 0   Entered and Authorized by:   Newt Lukes MD   Signed by:   Newt Lukes MD on 12/10/2009   Method used:   Electronically to        [facility] [phone]* (retail)       [address]       [place], [place]  [address]       Ph: [phone]       Fax: [phone]   RxID:   [id number] ONETOUCH LANCETS  MISC (LANCETS) use two times a day  #60 x 3   Entered by:   Orlan Leavens RMA   Authorized by:   Newt Lukes MD   Signed by:   Newt Lukes MD on 12/10/2009   Method used:   Electronically to        [facility] [phone]* (retail)       [address]       [place], [place]  [address]       Ph: [phone]       Fax: [phone]   RxID:   [id number] ONETOUCH ULTRA BLUE  STRP (GLUCOSE BLOOD) use check blood sugar two times a day  #60 x 3   Entered by:   Orlan Leavens RMA   Authorized by:   Newt Lukes MD   Signed by:   Newt Lukes MD on 12/10/2009   Method used:   Electronically to        [facility] [phone]* (retail)       [address]       [place], [place]  [address]       Ph: [phone]       Fax: [phone]   RxID:   [id number]    Orders Added: 1)  Glucose, (CBG) [82962] 2)  Est. Patient Level IV [43154] 3)  Prescription Created Electronically [phone]    Laboratory Results  Blood Tests     Glucose (fasting): 98 mg/dL   (Normal Range: 16-109)

## 2010-04-15 LAB — URINALYSIS, ROUTINE W REFLEX MICROSCOPIC
Bilirubin Urine: NEGATIVE
Bilirubin Urine: NEGATIVE
Glucose, UA: NEGATIVE mg/dL
Hgb urine dipstick: NEGATIVE
Ketones, ur: NEGATIVE mg/dL
Nitrite: NEGATIVE
Nitrite: NEGATIVE
Protein, ur: NEGATIVE mg/dL
Specific Gravity, Urine: 1.007 (ref 1.005–1.030)
Specific Gravity, Urine: 1.01 (ref 1.005–1.030)
Urobilinogen, UA: 1 mg/dL (ref 0.0–1.0)
pH: 6.5 (ref 5.0–8.0)

## 2010-04-15 LAB — WET PREP, GENITAL
Clue Cells Wet Prep HPF POC: NONE SEEN
Trich, Wet Prep: NONE SEEN
Yeast Wet Prep HPF POC: NONE SEEN

## 2010-04-15 LAB — DIFFERENTIAL
Basophils Absolute: 0 10*3/uL (ref 0.0–0.1)
Basophils Absolute: 0 K/uL (ref 0.0–0.1)
Basophils Relative: 1 % (ref 0–1)
Eosinophils Absolute: 0 10*3/uL (ref 0.0–0.7)
Eosinophils Absolute: 0.1 10*3/uL (ref 0.0–0.7)
Eosinophils Relative: 0 % (ref 0–5)
Eosinophils Relative: 2 % (ref 0–5)
Lymphocytes Relative: 27 % (ref 12–46)
Lymphs Abs: 1.1 10*3/uL (ref 0.7–4.0)
Lymphs Abs: 1.4 K/uL (ref 0.7–4.0)
Monocytes Absolute: 0.5 10*3/uL (ref 0.1–1.0)
Monocytes Relative: 10 % (ref 3–12)
Neutro Abs: 3.2 10*3/uL (ref 1.7–7.7)
Neutrophils Relative %: 60 % (ref 43–77)

## 2010-04-15 LAB — COMPREHENSIVE METABOLIC PANEL
ALT: 130 U/L — ABNORMAL HIGH (ref 0–35)
ALT: 73 U/L — ABNORMAL HIGH (ref 0–35)
AST: 47 U/L — ABNORMAL HIGH (ref 0–37)
AST: 60 U/L — ABNORMAL HIGH (ref 0–37)
Albumin: 3.9 g/dL (ref 3.5–5.2)
Alkaline Phosphatase: 120 U/L — ABNORMAL HIGH (ref 39–117)
CO2: 26 mEq/L (ref 19–32)
Chloride: 104 mEq/L (ref 96–112)
Chloride: 107 mEq/L (ref 96–112)
GFR calc Af Amer: >60 mL/min (ref >60)
GFR calc Af Amer: >60 mL/min (ref >60)
GFR calc non Af Amer: >60 mL/min (ref >60)
Potassium: 3.3 mEq/L — ABNORMAL LOW (ref 3.5–5.1)
Potassium: 3.7 mEq/L (ref 3.5–5.1)
Sodium: 138 mEq/L (ref 135–145)
Sodium: 138 mEq/L (ref 135–145)
Total Bilirubin: 0.6 mg/dL (ref 0.3–1.2)
Total Protein: 7.9 g/dL (ref 6.0–8.3)

## 2010-04-15 LAB — COMPREHENSIVE METABOLIC PANEL WITH GFR
BUN: 5 mg/dL — ABNORMAL LOW (ref 6–23)
CO2: 24 meq/L (ref 19–32)
Calcium: 9 mg/dL (ref 8.4–10.5)
Creatinine, Ser: 0.58 mg/dL (ref 0.4–1.2)
GFR calc non Af Amer: >60 mL/min (ref >60)
Glucose, Bld: 99 mg/dL (ref 70–99)
Total Bilirubin: 0.4 mg/dL (ref 0.3–1.2)

## 2010-04-15 LAB — CBC
HCT: 37.7 % (ref 36.0–46.0)
Hemoglobin: 12.9 g/dL (ref 12.0–15.0)
MCHC: 34.3 g/dL (ref 30.0–36.0)
MCV: 79.6 fL (ref 78.0–100.0)
Platelets: 207 10*3/uL (ref 150–400)
RBC: 4.74 MIL/uL (ref 3.87–5.11)
RBC: 4.92 MIL/uL (ref 3.87–5.11)
RDW: 15.3 % (ref 11.5–15.5)
WBC: 5.3 10*3/uL (ref 4.0–10.5)
WBC: 7.2 10*3/uL (ref 4.0–10.5)

## 2010-04-15 LAB — URINE MICROSCOPIC-ADD ON

## 2010-04-15 LAB — GC/CHLAMYDIA PROBE AMP, GENITAL
Chlamydia, DNA Probe: NEGATIVE
GC Probe Amp, Genital: NEGATIVE

## 2010-04-15 LAB — LIPASE, BLOOD: Lipase: 52 U/L (ref 11–59)

## 2010-04-29 LAB — DIFFERENTIAL
Basophils Absolute: 0 10*3/uL (ref 0.0–0.1)
Basophils Relative: 0 % (ref 0–1)
Monocytes Relative: 6 % (ref 3–12)
Neutro Abs: 3.4 10*3/uL (ref 1.7–7.7)
Neutrophils Relative %: 62 % (ref 43–77)

## 2010-04-29 LAB — COMPREHENSIVE METABOLIC PANEL
Alkaline Phosphatase: 96 U/L (ref 39–117)
BUN: 8 mg/dL (ref 6–23)
Creatinine, Ser: 0.61 mg/dL (ref 0.4–1.2)
Glucose, Bld: 125 mg/dL — ABNORMAL HIGH (ref 70–99)
Potassium: 3.6 mEq/L (ref 3.5–5.1)
Total Bilirubin: 0.5 mg/dL (ref 0.3–1.2)
Total Protein: 8 g/dL (ref 6.0–8.3)

## 2010-04-29 LAB — RHEUMATOID FACTOR: Rhuematoid fact SerPl-aCnc: <20 IU/mL (ref 0–20)

## 2010-04-29 LAB — CBC
HCT: 36.7 % (ref 36.0–46.0)
Hemoglobin: 12.4 g/dL (ref 12.0–15.0)
MCV: 78.2 fL (ref 78.0–100.0)
RDW: 15.1 % (ref 11.5–15.5)

## 2010-04-29 LAB — LYSOZYME, SERUM: Lysozyme: 6.3 ug/mL — ABNORMAL LOW (ref 7.0–15.0)

## 2010-04-29 LAB — ANA: Anti Nuclear Antibody(ANA): NEGATIVE

## 2010-05-11 LAB — URINE MICROSCOPIC-ADD ON

## 2010-05-11 LAB — CBC
Platelets: 203 10*3/uL (ref 150–400)
RDW: 21.2 % — ABNORMAL HIGH (ref 11.5–15.5)
WBC: 9.6 10*3/uL (ref 4.0–10.5)

## 2010-05-11 LAB — URINE CULTURE: Colony Count: >=100000

## 2010-05-11 LAB — URINALYSIS, ROUTINE W REFLEX MICROSCOPIC
Leukocytes, UA: NEGATIVE
Specific Gravity, Urine: 1.01 (ref 1.005–1.030)
Urobilinogen, UA: 0.2 mg/dL (ref 0.0–1.0)

## 2010-05-11 LAB — RAPID URINE DRUG SCREEN, HOSP PERFORMED
Amphetamines: NOT DETECTED
Benzodiazepines: NOT DETECTED
Cocaine: NOT DETECTED
Tetrahydrocannabinol: NOT DETECTED

## 2010-05-11 LAB — GLUCOSE, CAPILLARY: Glucose-Capillary: 100 mg/dL — ABNORMAL HIGH (ref 70–99)

## 2010-05-11 LAB — WET PREP, GENITAL: Yeast Wet Prep HPF POC: NONE SEEN

## 2010-05-11 LAB — GC/CHLAMYDIA PROBE AMP, GENITAL
Chlamydia, DNA Probe: NEGATIVE
GC Probe Amp, Genital: NEGATIVE

## 2010-05-11 LAB — STREP B DNA PROBE: Strep Group B Ag: NEGATIVE

## 2010-05-11 LAB — RPR: RPR Ser Ql: NONREACTIVE

## 2010-05-12 LAB — CBC
HCT: 23.4 % — ABNORMAL LOW (ref 36.0–46.0)
Hemoglobin: 8 g/dL — ABNORMAL LOW (ref 12.0–15.0)
Platelets: 181 10*3/uL (ref 150–400)
Platelets: 192 10*3/uL (ref 150–400)
RDW: 13.9 % (ref 11.5–15.5)
WBC: 14.7 10*3/uL — ABNORMAL HIGH (ref 4.0–10.5)
WBC: 19.1 10*3/uL — ABNORMAL HIGH (ref 4.0–10.5)

## 2010-05-12 LAB — GLUCOSE, CAPILLARY: Glucose-Capillary: 88 mg/dL (ref 70–99)

## 2010-05-20 LAB — ANTIBODY SCREEN: Antibody Screen: NEGATIVE

## 2010-05-20 LAB — ABO/RH: RH Type: POSITIVE

## 2010-05-20 LAB — HIV ANTIBODY (ROUTINE TESTING W REFLEX): HIV: NONREACTIVE

## 2010-06-09 NOTE — Op Note (Signed)
NAME:  Jasmine Howard, Jasmine Howard NO.:  [id number]   MEDICAL RECORD NO.:  [id number]          PATIENT TYPE:  OIB   LOCATION:  1525                         FACILITY:  [facility]   PHYSICIAN:  Sandria Bales. Ezzard Standing, M.D.  DATE OF BIRTH:  10-15-1975   DATE OF PROCEDURE:  03/09/2007  DATE OF DISCHARGE:  03/10/2007                               OPERATIVE REPORT   Why can't date of surgery be here?   PREOPERATIVE DIAGNOSIS:  Cholecystitis and cholelithiasis.   POSTOPERATIVE DIAGNOSIS:  Cholecystitis and cholelithiasis.   PROCEDURE:  Laparoscopic cholecystectomy with intraoperative  cholangiogram.   SURGEON:  Sandria Bales. Ezzard Standing, M.D.   ASSISTANT:  Currie Paris, M.D.   ANESTHESIA:  General endotracheal with approximately 20 mL of 0.25%  Marcaine.   INDICATIONS FOR PROCEDURE:  The patient is a 35 year old Hispanic female  who has approximately one-week history of abdominal pain, epigastric,  and right upper quadrant.  She presented to the [facility] emergency room on February 7 and February 10, and has  gallstones.  She was seen originally by Lennie Muckle, M.D. who dictated  her history and physical.  Dr. Freida Busman will be out of town for several  days and called and asked if I would do her operation.   I met the patient in the holding area and went over with her the  indications and potential complications of surgery.  Potential  complications including, but not limited to bleeding, infection, bile  duct injury and the possibility of open surgery.  The patient  understands the operation and is anxious to go ahead because of her pain  and discomfort.   DESCRIPTION OF PROCEDURE:  The patient is placed in the supine position,  given general endotracheal anesthetic, and 1 gram of Ancef at initiation  of the procedure.  She had her abdomen prepped with Betadine solution  and sterilely draped.  A time out was held  identifying patient and  procedure.  I went through  an infraumbilical incision with sharp  dissection carried down to the abdominal cavity.  A 0 degree 10 mm  laparoscope was inserted through a 12 mm Hasson trocar and the Hasson  trocar secured with a 0 Vicryl suture.   Abdominal exploration revealed right and left lobes of the liver  unremarkable, anterior wall of the stomach unremarkable.  The omentum  and bowel that I could see was unremarkable.  Posterior distal trocar  was a 10 mm subxiphoid trocar, 5 mm mid subcostal, and 5 mm lateral  subcostal trocar and the gallbladder was identified.  The gallbladder  was noted to have adhesions of both omentum and duodenum consistent with  chronic cholecystitis.  She has a really thickened gallbladder wall.  I  think her symptoms have been going on more than just the week that she  gives a history of.   I was able to dissect off the duodenum and the tissues around it.  I  isolated the cystic artery which I doubly endoclipped and divided.  I  isolated the cystic duct and created a window at the angle of Calot. I  then placed a clip on the gallbladder side of the cystic duct.  I made  an incision on the side of the cystic duct and I shot an intraoperative  cholangiogram using a cutoff taut catheter which was inserted through a  14 gauge Jelco into the side of the cut cystic duct and secured with an  Endoclip.  The taut catheter I injected half strength Renografin using  approximately 10 mL which showed free flow down the cystic duct and the  common bile duct up to the hepatic duct into at least the right main  hepatic duct.  I am not really sure we saw the left main hepatic duct,  but I thought the duct system otherwise looked normal.   There was no filling defect.  Contrast emptied promptly into the  duodenum and this was felt to be a normal intraoperative cholangiogram.  I then removed the taut catheter, I clipped the cystic duct three times  before dividing the cystic duct.  I then sharply  and bluntly divided the  gallbladder from the gallbladder bed.  I think she had a posterior  branch of her cystic artery which I also clipped, but I found no  visualization of the triangle of Calot.  I got the gallbladder out in  its entirety.  I placed an EndoCatch bag in the liver through the  umbilicus.  When I finally delivered the gallbladder, she had at least  1, almost 2+ cm size stone.  My guess is that this has been impacting in  the gallbladder cystic duct junction.   I then revisualized the liver bed.  There was no bleeding from the liver  bed and no bile leak.  The triangle of Calot was visualized.  This was  then removed all the trocars in turn.  The umbilical port was closed  with a 0 Vicryl suture x3.  The skin at each site was closed with a 5-0  Monocryl suture.  The wounds were then dressed with Dermabond.  The  patient tolerated the procedure well and was transported to the recovery  room in good condition.  Needle, sponge, and instrument counts correct.      Sandria Bales. Ezzard Standing, M.D.  Electronically Signed     DHN/MEDQ  D:  03/09/2007  T:  03/11/2007  Job:  [id number]

## 2010-06-09 NOTE — Op Note (Signed)
NAMEMarland Kitchen  Jasmine Howard, Jasmine Howard         ACCOUNT NO.:  [id number]   MEDICAL RECORD NO.:  [id number]          PATIENT TYPE:  OUT   LOCATION:  ULT                           FACILITY:  [facility]   PHYSICIAN:  Allie Bossier, MD        DATE OF BIRTH:  1975-04-28   DATE OF PROCEDURE:  DATE OF DISCHARGE:  02/08/2008                               OPERATIVE REPORT   PREOPERATIVE DIAGNOSIS:  Incompetent cervix at 21-4/7 weeks, estimated  gestational age.   POSTOPERATIVE DIAGNOSIS:  Incompetent cervix at 21-4/7 weeks, estimated  gestational age.   PROCEDURE:  Exam under anesthesia.  I declined an attempted rescue  cerclage .   SURGEON:  Allie Bossier, MD   ANESTHESIA:  Spinal, Angelica Pou, MD.   COMPLICATIONS:  None.   ESTIMATED BLOOD LOSS:  Minimal.   SPECIMEN:  None.   FINDINGS:  Cervix dilated 3-4 cm.  No visible cervix noted.  Bulging  membranes out 3-4 cm.   DETAILED PROCEDURE AND FINDINGS:  Preoperatively, Dr. Hollie Salk, I spoke with  [name] about the possibility of rescue cerclage.  She  understands risks include premature rupture of membranes as well as  failure of the cerclage to work.  She was taken to the operating room  and the spinal anesthesia was placed with her in the lateral position.  She was then placed in Trendelenburg position with her legs in the  dorsal lithotomy position.  Her pubic hair was shaved and the external  genitalia were prepped and draped.  There was a large amount of mucousy  discharge coming forth from the vagina.  Placed a normal speculum in and  only a bulging membrane was visualized, placed a larger speculum and to  attempt to visualize the cervix.  There was no cervix visible, just 3-4  cm of bulging membranes.  Please note that at the beginning of the case  her bladder was full.  At this point, when it was obvious I could not  place a cerclage, I emptied her bladder approximately 600 mL of clear  urine was left in the Foley bag.  Even steep  Trendelenburg there was  nothing visible except the bulging membranes.  I elected to stop and  explained to [facility]  that I would not be able to perform this cerclage  today.      Allie Bossier, MD  Electronically Signed     MCD/MEDQ  D:  02/26/2008  T:  02/27/2008  Job:  [id number]

## 2010-06-09 NOTE — H&P (Signed)
NAME:  Jasmine Howard, Jasmine Howard NO.:  [id number]   MEDICAL RECORD NO.:  [id number]          PATIENT TYPE:  OIB   LOCATION:  1525                         FACILITY:  [facility]   PHYSICIAN:  Lennie Muckle, MD      DATE OF BIRTH:  1975/10/07   DATE OF ADMISSION:  03/09/2007  DATE OF DISCHARGE:  03/10/2007                              HISTORY & PHYSICAL   ADMISSION DIAGNOSIS:  Cholelithiasis with cholecystitis.   HISTORY OF PRESENT ILLNESS:  The patient is a 35 year old female who had  approximately 6 days of epigastric right upper quadrant pain.  She was  seen in the emergency department on March 04, 2007, due to epigastric  and right upper quadrant pain with some radiation to her back.  She had  diarrhea with this episode and also had nausea.  In the emergency  department, she had laboratory data drawn which revealed a normal white  count of 7.8, hemoglobin and hematocrit 8.6 and 27.4.  She then was sent  home only to return on February 10, due to severe epigastric discomfort,  right upper quadrant pain again with radiation to her back.  An  ultrasound was performed which revealed cholelithiasis, minimal wall  thickening at 4 mm, no pericholecystic fluid.  She has had again labs  drawn with a normal white count.  She has normal serum chemistries that  continues to have discomfort in the epigastric and right upper quadrant  region.  She has been given Percocet 7.5 mg and this does help with her  pain, but she has dizziness and weakness associated with taking her  Percocet.   PAST MEDICAL HISTORY:  Negative.   PAST SURGICAL HISTORY:  Negative.   MEDICATIONS:  Percocet.   ALLERGIES:  No known drug allergies.   FAMILY HISTORY:  Coronary artery disease and hypertension.   SOCIAL HISTORY:  No tobacco or alcohol use.   REVIEW OF SYSTEMS:  Negative.   PHYSICAL EXAMINATION:  GENERAL:  She appears flushed and uncomfortable.  VITAL SIGNS:  Blood pressure 123/72, pulse 70, and  temperature 98.2,  weight 183.  HEENT:  Extraocular muscles are intact.  No scleral icterus is evident.  No jaundice is noted.  CHEST:  Clear to auscultation bilaterally.  CARDIOVASCULAR:  Regular rate and rhythm.  ABDOMEN:  Soft.  It is tender to palpation in the epigastric as well as  the right upper quadrant.  No peritoneal signs.  EXTREMITIES:  Without edema or deformity.  NEUROLOGY:  Cranial nerves II-XII grossly intact.  No focal deficits.  SKIN:  Grossly normal.   ASSESSMENT:  Cholelithiasis and mild case of cholecystitis.  I think the  patient needs to have laparoscopic cholecystectomy due to her continued  pain.  I think she does have a case of cholecystitis due to her  continued pain  and the need for continued narcotics.  I have discussed this with Sandria Bales. Ezzard Standing, M.D. and have been able to post her for the operating room  today with Dr. Ezzard Standing.  All questions have been answered and she will  proceed to [facility] for her surgery.  Lennie Muckle, MD  Electronically Signed     ALA/MEDQ  D:  03/09/2007  T:  03/10/2007  Job:  [id number]

## 2010-06-09 NOTE — Op Note (Signed)
NAMEMELITTA, TIGUE         ACCOUNT NO.:  [id number]   MEDICAL RECORD NO.:  [id number]          PATIENT TYPE:  INP   LOCATION:  9306                          FACILITY:  [facility]   PHYSICIAN:  Allie Bossier, MD        DATE OF BIRTH:  27-Apr-1975   DATE OF PROCEDURE:  03/02/2008  DATE OF DISCHARGE:  03/03/2008                               OPERATIVE REPORT   PREOPERATIVE DIAGNOSES:  22-1/2 weeks' estimated gestational age, status  post normal spontaneous vaginal delivery due to incompetent cervix, now  with retained placenta and  chorioamnionitis.   POSTOPERATIVE DIAGNOSES:  22-1/2 weeks' estimated gestational age,  status post normal spontaneous vaginal delivery due to incompetent  cervix, now with retained placenta and chorioamnionitis.   PROCEDURE:  Suction dilation and curettage.   SURGEON:  Myra C. Marice Potter, MD   ANESTHESIA:  General, Tyrone Apple Malen Gauze, MD   COMPLICATIONS:  None.   ESTIMATED BLOOD LOSS:  500 mL.   SPECIMENS:  Placenta.   PROCEDURE AND FINDINGS:  The risks, benefits, alternatives of surgery  were explained, understood, and accepted.  Consents were signed.  She  was certain that she did not want to have another spinal anesthetic and  opted to have a general.  In the operating room, she was placed in the  dorsal lithotomy position.  Once she reported herself to be comfortable,  general anesthesia was applied without complication.  Her vagina was  prepped and draped in usual sterile fashion.  The placenta was distal in  the vagina and it was grasped and removed with a ring forceps.  A sharp  curettage revealed still a moderate amount of placental tissue inside  the uterus.  Sharp curettage was done until a gritty sensation was  appreciated throughout.  A #16 curved suction curette was used.  Suction  was applied until no further placental products were obtained.  Sharp  curettage again confirmed complete emptying.  The tenaculum was removed  off the anterior lip  of the cervix.  No bleeding was noted from that  site.  Pitocin was given with her IV fluids and the uterus clamped down.  She was taken to recovery room in stable condition.  The instruments,  sponges, and needle counts were correct.  She tolerated the procedure  well.      Allie Bossier, MD  Electronically Signed     MCD/MEDQ  D:  03/04/2008  T:  03/05/2008  Job:  [id number]

## 2010-06-12 ENCOUNTER — Ambulatory Visit (HOSPITAL_COMMUNITY)
Admission: RE | Admit: 2010-06-12 | Discharge: 2010-06-12 | Disposition: A | Discharge status: Home / Self Care | Payer: BC Managed Care – PPO | Source: Ambulatory Visit | Attending: Obstetrics | Admitting: Obstetrics

## 2010-06-12 DIAGNOSIS — O3430 Maternal care for cervical incompetence, unspecified trimester: Secondary | ICD-10-CM | POA: Insufficient documentation

## 2010-06-12 LAB — CBC
HCT: 35.6 % — ABNORMAL LOW (ref 36.0–46.0)
Hemoglobin: 11.8 g/dL — ABNORMAL LOW (ref 12.0–15.0)
MCH: 25.4 pg — ABNORMAL LOW (ref 26.0–34.0)
MCHC: 33.1 g/dL (ref 30.0–36.0)
RBC: 4.65 MIL/uL (ref 3.87–5.11)

## 2010-06-12 LAB — URINALYSIS, ROUTINE W REFLEX MICROSCOPIC
Glucose, UA: NEGATIVE mg/dL
Hgb urine dipstick: NEGATIVE
Protein, ur: NEGATIVE mg/dL
pH: 7.5 (ref 5.0–8.0)

## 2010-06-12 LAB — URINE MICROSCOPIC-ADD ON

## 2010-06-12 LAB — GLUCOSE, CAPILLARY: Glucose-Capillary: 83 mg/dL (ref 70–99)

## 2010-06-12 NOTE — Group Therapy Note (Signed)
NAME:  Jasmine Howard, JENNING NO.:  [id number]   MEDICAL RECORD NO.:  [id number]          PATIENT TYPE:  WOC   LOCATION:  [facility]                   FACILITY:  [facility]   PHYSICIAN:  Argentina Donovan, MD        DATE OF BIRTH:  08-31-1975   DATE OF SERVICE:                                    CLINIC NOTE   The patient is a 35 year old, English-speaking, Hispanic female,  nulligravida who has never had regular periods and has been trying to get  pregnant for 9 years.  Her husband strayed some time ago and had a child by  another woman in the child is now 72 years old.  The patient has 5 feet 3  inches, weighs 190 pounds and states that she has been bleeding since  December, light days and heady days, but spotting or bleeding almost  everyday.  There had been years when she had only 2 periods within the  period of a year.  She has very little sign of hyperandrogenation, no  significant hirsutism and relatively nice facial skin without acne.  She had  a Pap smear last December and is not due for this at this time, and other  than her irregular periods, has no gynecological symptoms.  She takes  vitamins, but no other medication on a regular basis.  As far as infertility  workup she had several years ago, had been given several months of Clomid  without success.  We discussed with her a complete workup starting with  trying to stimulate regular ovulation by Provera preparation followed by  Clomid once a day, day 5-9 and if that did not work, we would continue to  increase it up to 3 a day.  We have also discussed postcoital test,  hysterosalpingogram, laparoscopy and endocrine evaluation.  We are going to  draw at least a TSH today, but I am not going to get any other lab tests in  order to keep the costs down as low as possible for this patient and even  though her husband has had a child, I do want a semen analysis to make sure  he is not 1 of the borderline type did just fortunately  got someone  pregnant.  In addition to this, we have started her on a temperature chart  and I will see her back and 6 weeks, which will be after her first course of  Clomid and her first 6 weeks on a temperature chart.  Other than that, we  will put off significant testing.   IMPRESSION:  1.  Primary infertility.  2.  Oligomenorrhea.  3.  Probable polycystic ovarian syndrome.   Ultrasound basis will be obtained since pelvic examination, because of  habitus, is not accurate.           ______________________________  Argentina Donovan, MD     PR/MEDQ  D:  04/21/2005  T:  04/22/2005  Job:  [id number]

## 2010-06-12 NOTE — Discharge Summary (Signed)
Jasmine Howard, Jasmine Howard         ACCOUNT NO.:  [id number]   MEDICAL RECORD NO.:  [id number]          PATIENT TYPE:  INP   LOCATION:  9306                          [facility]   PHYSICIAN:  Lazaro Arms, M.D.   DATE OF BIRTH:  May 01, 1975   DATE OF ADMISSION:  02/24/2008  DATE OF DISCHARGE:  03/03/2008                               DISCHARGE SUMMARY   DISCHARGE DIAGNOSES:  1. Status post pregnancy loss at 65 weeks' gestation.  2. Cervical incompetence.  3. Unremarkable postpartum course.   Please refer to history and physical for details of admission to the  hospital.   HOSPITAL COURSE:  The patient was admitted on February 24, 2008, with  bulging membranes, cervical dilation, and diagnosis of incompetent  cervix.  The patient was taken to the OR on February 26, 2008.  After  being placed in Trendelenburg, we tried to do an emergency cerclage.  She had bulging membrane, no visible cervix, and was essentially  hourglass and we had no opportunity to do a cerclage so that was  abandoned.  She was then placed in bed rest and maintained bed rest in  Trendelenburg position over the ensuing days with the hopes that the  membranes would recede and we could take her back to do a cerclage.  Unfortunately, that did not occur.  The patient has be placed on  antibiotics IV and p.o. antibiotics, but in spite of that she  experienced membrane rupture and subsequent delivery, which she had a  retained placenta and had to have an operative removal in the OR.  Blood  loss total was 500 mL.  Her uterus probed up nicely thereafter, and she  had minimal bleeding.  Her postop hemoglobin was 823.4.  She was stable.  She was afebrile.  Her abdomen was benign.  She was discharged to home  in the morning of March 03, 2008, to follow up with health department  in 2 weeks.  She was given prenatal vitamins, iron, and Motrin for pain.  She will be seen at that time and given instructions and precautions  for  return prior.      Lazaro Arms, M.D.  Electronically Signed     LHE/MEDQ  D:  03/28/2008  T:  03/28/2008  Job:  [id number]

## 2010-06-21 NOTE — Op Note (Signed)
  [name], [name]               ACCOUNT NO.:  [id number]  MEDICAL RECORD NO.:  [id number]           PATIENT TYPE:  O  LOCATION:  [facility]                          [facility]  PHYSICIAN:  Lendon Colonel, MD   DATE OF BIRTH:  [birth year]  DATE OF PROCEDURE:  06/12/2010 DATE OF DISCHARGE:                              OPERATIVE REPORT   PREOPERATIVE DIAGNOSIS:  Cervical incompetence.  POSTOPERATIVE DIAGNOSIS:  Cervical incompetence.  PROCEDURE:  Prophylactic elective McDonald cerclage.  SURGEON:  Lendon Colonel, MD  ASSISTANT:  None.  ANESTHESIA:  Spinal.  FINDINGS:  On ultrasound, preoperatively 4 cm long closed cervix.  On physical exam, 2 cm of external cervical length before bladder. Stitch placed at that junction and adequate hemostasis.  Good tensioning on the stitch.  ESTIMATED BLOOD LOSS:  Minimal.  COMPLICATIONS:  None.  ANTIBIOTICS:  None.  INDICATION:  This is a G2, P 0-1-0-0 with history of 22-week loss for cervical incompetence who presents for elective prophylactic McDonald cerclage while her cervix is long and closed.  After risks, benefits, and alternatives of the procedure were discussed with the patient, the patient was taken to the operating room where spinal anesthesia was administered without difficulty.  She was prepped and draped in normal sterile fashion in a dorsal supine lithotomy position.  A straight cath was done to drain her bladder.  Weighted speculum placed in the vagina and vaginal wall retractor was used on the anterior and lateral vagina. Bimanual examination was used to confirm the placement of the stitch.  A ring forceps was placed on the cervix, used to walk around on the cervix, to evaluate the cervix prior to starting the procedure.  Once there was adequate room to put a cerclage in place, a 0 Ethibond suture on a 1/2-inch Mayo needle was used in a circumferential fashion around the cervix.  A total of 5 stitches were used in  a pursestring fashion and the knot was tied at 1 o'clock.  Before pulling down the knot, a piece of Prolene suture was used to put just underneath the Ethibond suture so that it could be used as a tag to find later.  After putting the Prolene suture through the Ethibond, the Ethibond suture total of 5 knots were thrown and about 15 knots of Prolene were thrown on top of the Ethibond suture to allow a visible tag later in the pregnancy.  The patient tolerated the procedure well.  Sponge, lap, and needle counts were correct x3, and the patient was taken to the recovery room in stable condition.     Lendon Colonel, MD     KAF/MEDQ  D:  06/12/2010  T:  06/13/2010  Job:  [id number]  Electronically Signed by [name] MD on 06/21/2010 01:05:28 PM

## 2010-07-27 ENCOUNTER — Encounter: Payer: Self-pay | Admitting: Internal Medicine

## 2010-08-25 ENCOUNTER — Encounter (HOSPITAL_COMMUNITY): Payer: Self-pay

## 2010-08-25 ENCOUNTER — Other Ambulatory Visit: Payer: Self-pay

## 2010-08-25 ENCOUNTER — Inpatient Hospital Stay (HOSPITAL_COMMUNITY)
Admission: AD | Admit: 2010-08-25 | Discharge: 2010-08-25 | Disposition: A | Discharge status: Home / Self Care | Payer: BC Managed Care – PPO | Source: Ambulatory Visit | Attending: Obstetrics & Gynecology | Admitting: Obstetrics & Gynecology

## 2010-08-25 DIAGNOSIS — R55 Syncope and collapse: Secondary | ICD-10-CM | POA: Insufficient documentation

## 2010-08-25 DIAGNOSIS — O9989 Other specified diseases and conditions complicating pregnancy, childbirth and the puerperium: Secondary | ICD-10-CM | POA: Insufficient documentation

## 2010-08-25 LAB — COMPREHENSIVE METABOLIC PANEL
AST: 12 U/L (ref 0–37)
Albumin: 2.9 g/dL — ABNORMAL LOW (ref 3.5–5.2)
BUN: 8 mg/dL (ref 6–23)
Calcium: 9.6 mg/dL (ref 8.4–10.5)
Chloride: 104 mEq/L (ref 96–112)
Creatinine, Ser: <0.47 mg/dL — ABNORMAL LOW (ref 0.50–1.10)
Total Protein: 7.2 g/dL (ref 6.0–8.3)

## 2010-08-25 MED ORDER — LACTATED RINGERS IV SOLN
INTRAVENOUS | Status: DC
  Administered 2010-08-25: 16:00:00 via INTRAVENOUS

## 2010-08-25 NOTE — Progress Notes (Signed)
Pt states passed out while at [facility] doctors office today. Pt states was "helped" to the floor so did not fall.

## 2010-08-25 NOTE — ED Provider Notes (Signed)
History     Chief Complaint  Patient presents with  . Loss of Consciousness    Syncope   HPI G2P0 at 23.5/7 wks, sent from office with momentory loss of consciousness /syncope in office, didn't fall but was helped to ground by staff today and by husband at home yesterday. Feels well otherwise.  ROS- No CP/SOB. No ear pain/fever/nausea/vomiting.  Is DM, taking meds/meals well as scheduled. Works at desk, not too much exertion at work or home. No Ob complaints, good FMs, no UCs/vag blding.  PGyn/Ob hx- Incompetent cx, has cerclage in place now, intact her office exam.   Past Medical History  Diagnosis Date  . Abdominal pain, right lower quadrant 03/07/2009  . ANEMIA-NOS 09/26/2008  . BENIGN POSITIONAL VERTIGO 12/10/2009  . CHICKENPOX, HX OF 09/26/2008  . CERVICAL INCOMPETENCE 02/24/2008  . DEPRESSION 09/26/2008  . FATTY LIVER DISEASE 03/31/2009  . HEMATOCHEZIA 09/26/2008  . HYPOTHYROIDISM 12/10/2009  . Irritable bowel syndrome 03/31/2009  . IRITIS 03/07/2009  . DIABETES MELLITUS, TYPE II 12/10/2009    metformin    Past Surgical History  Procedure Date  . Cholecystectomy 2009    Family History  Problem Relation Age of Onset  . Arthritis Other     Parent  . Alcohol abuse Other     Parent    History  Substance Use Topics  . Smoking status: Never Smoker   . Smokeless tobacco: Never Used   Comment: Married, lives with spouse and his son. 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 Alcohol Use: No    Allergies: No Known Allergies  Prescriptions prior to admission  Medication Sig Dispense Refill  . glucose blood (ONE TOUCH ULTRA TEST) test strip 1 each by Other route 2 (two) times daily. Use as instructed       . levothyroxine (SYNTHROID, LEVOTHROID) 50 MCG tablet Take 50 mcg by mouth daily.        . metFORMIN (GLUCOPHAGE-XR) 750 MG 24 hr tablet Take 750 mg by mouth 2 (two) times daily.        . Multiple Vitamin (MULTIVITAMIN) tablet Take 1 tablet by mouth daily.        .  ONE TOUCH LANCETS MISC by Does not apply route 2 (two) times daily.          ROS Physical Exam   Blood pressure 103/56, pulse 75, temperature 98.3 F (36.8 C), temperature source Oral, resp. rate 18, SpO2 97.00%. Physical Exam A&O x 3, NAD. Tolerated clears in MAU. Lungs CTA b/l CV RRR, BP nl Abdo soft, gravid, NT Extr no c/c/e. No CT bil.  NST - reactive for 23 wks, no UCs noted.     MAU Course  Procedures EKG and labs normal.     Assessment and Plan  Bed rest with activity as tolerated for 2 days, off work today and tomorrow. Outpt Cardiology consult. Hydration and sleep well.  Continue all meds as advised from office.   Maureena Dabbs R 08/25/2010, 3:50 PM

## 2010-08-26 ENCOUNTER — Inpatient Hospital Stay (HOSPITAL_COMMUNITY)
Admission: AD | Admit: 2010-08-26 | Discharge: 2010-08-26 | Disposition: A | Discharge status: Home / Self Care | Payer: BC Managed Care – PPO | Source: Ambulatory Visit | Attending: Obstetrics & Gynecology | Admitting: Obstetrics & Gynecology

## 2010-08-26 ENCOUNTER — Inpatient Hospital Stay (HOSPITAL_COMMUNITY): Payer: BC Managed Care – PPO

## 2010-08-26 ENCOUNTER — Encounter (HOSPITAL_COMMUNITY): Payer: Self-pay | Admitting: *Deleted

## 2010-08-26 DIAGNOSIS — R55 Syncope and collapse: Secondary | ICD-10-CM | POA: Insufficient documentation

## 2010-08-26 DIAGNOSIS — R000 Tachycardia, unspecified: Secondary | ICD-10-CM | POA: Insufficient documentation

## 2010-08-26 DIAGNOSIS — R42 Dizziness and giddiness: Secondary | ICD-10-CM | POA: Insufficient documentation

## 2010-08-26 DIAGNOSIS — R079 Chest pain, unspecified: Secondary | ICD-10-CM | POA: Insufficient documentation

## 2010-08-26 DIAGNOSIS — O9989 Other specified diseases and conditions complicating pregnancy, childbirth and the puerperium: Secondary | ICD-10-CM | POA: Insufficient documentation

## 2010-08-26 DIAGNOSIS — O09529 Supervision of elderly multigravida, unspecified trimester: Secondary | ICD-10-CM | POA: Insufficient documentation

## 2010-08-26 HISTORY — DX: Female infertility, unspecified: N97.9

## 2010-08-26 HISTORY — DX: Polycystic ovarian syndrome: E28.2

## 2010-08-26 HISTORY — DX: Incompetence of cervix uteri: N88.3

## 2010-08-26 MED ORDER — IOHEXOL 300 MG/ML  SOLN
100.0000 mL | Freq: Once | INTRAMUSCULAR | Status: AC | PRN
  Administered 2010-08-26: 100 mL via INTRAVENOUS

## 2010-08-26 NOTE — ED Provider Notes (Signed)
History  Jasmine Howard 35 y.o. G2P0100  At 23 6/7 wks     Chief Complaint  Patient presents with  . Shortness of Breath  . Dizziness    OB History    Grav Para Term Preterm Abortions TAB SAB Ect Mult Living   2 1 0 1 0 0 0 0 0 0       Past Medical History  Diagnosis Date  . Abdominal pain, right lower quadrant 03/07/2009  . ANEMIA-NOS 09/26/2008  . BENIGN POSITIONAL VERTIGO 12/10/2009  . CHICKENPOX, HX OF 09/26/2008  . CERVICAL INCOMPETENCE 02/24/2008  . DEPRESSION 09/26/2008  . FATTY LIVER DISEASE 03/31/2009  . HEMATOCHEZIA 09/26/2008  . HYPOTHYROIDISM 12/10/2009  . Irritable bowel syndrome 03/31/2009  . IRITIS 03/07/2009  . DIABETES MELLITUS, TYPE II 12/10/2009    metformin  . Polycystic ovaries   . Infertility, female   . Cervical incompetence     Past Surgical History  Procedure Date  . Cholecystectomy 2009    Family History  Problem Relation Age of Onset  . Arthritis Other     Parent  . Alcohol abuse Other     Parent    History  Substance Use Topics  . Smoking status: Never Smoker   . Smokeless tobacco: Never Used   Comment: Married, lives with spouse and his son. 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 Alcohol Use: No    Allergies: No Known Allergies  Prescriptions prior to admission  Medication Sig Dispense Refill  . glucose blood (ONE TOUCH ULTRA TEST) test strip 1 each by Other route 2 (two) times daily. Use as instructed       . levothyroxine (SYNTHROID, LEVOTHROID) 50 MCG tablet Take 50 mcg by mouth daily.        . metFORMIN (GLUCOPHAGE-XR) 750 MG 24 hr tablet Take 750 mg by mouth 2 (two) times daily.        . Multiple Vitamin (MULTIVITAMIN) tablet Take 1 tablet by mouth daily.        . ONE TOUCH LANCETS MISC by Does not apply route 2 (two) times daily.          Physical Exam   Blood pressure 111/68, pulse 85, temperature 98.8 F (37.1 C), temperature source Oral, resp. rate 20, height 5\' 3"  (1.6 m), weight 89.812 kg (198 lb), SpO2  95.00%. Uterus soft, NT VE deferred FHR 130's variability present, no deceleration No UC.  ED Course  Spiral CT of lungs:  Neg for PE. Cardio visit this am:  Cardiac Echo neg.  Doppler lower limbs neg  A/P:  23 6/7 wks with syncope/Tachycardia/Tachypnea currently resolved.          Spiral CT of lungs neg for PE, Cardiac Echo neg, Doppler lower limbs neg.          Fetal well-being reassuring, no evidence of PTL.  Genia Del MD

## 2010-08-26 NOTE — Progress Notes (Signed)
Dr. Seymour Bars states she will be by to see patient as soon as she is able.

## 2010-08-26 NOTE — Plan of Care (Signed)
Results read to both Seymour Bars and Fogelman, Dr. Seymour Bars will see pt in MAU, no PE found

## 2010-08-26 NOTE — Progress Notes (Signed)
Orders called in  possible PE

## 2010-08-26 NOTE — ED Notes (Signed)
External fetal monitor removed for discharge home per Dr. Seymour Bars

## 2010-08-26 NOTE — Plan of Care (Signed)
Pt here yesterday due to syncope @ office  episode, home, then back to cardiac MD today, sent her after that visit for CT

## 2010-10-16 LAB — URINALYSIS, ROUTINE W REFLEX MICROSCOPIC
Glucose, UA: NEGATIVE
Ketones, ur: NEGATIVE
Protein, ur: NEGATIVE
Urobilinogen, UA: 1

## 2010-10-16 LAB — LIPASE, BLOOD: Lipase: 44

## 2010-10-16 LAB — COMPREHENSIVE METABOLIC PANEL
AST: 20
Albumin: 3.7
Alkaline Phosphatase: 78
BUN: 11
CO2: 27
Calcium: 8.9
Chloride: 104
Chloride: 106
Creatinine, Ser: 0.64
GFR calc Af Amer: >60
GFR calc non Af Amer: >60
Glucose, Bld: 120 — ABNORMAL HIGH
Potassium: 4.1
Total Bilirubin: 0.4
Total Bilirubin: 0.4

## 2010-10-16 LAB — CBC
HCT: 27.4 — ABNORMAL LOW
Hemoglobin: 8.6 — ABNORMAL LOW
MCV: 62.4 — ABNORMAL LOW
MCV: 62.8 — ABNORMAL LOW
Platelets: 242
WBC: 6.5
WBC: 7.8

## 2010-10-16 LAB — DIFFERENTIAL
Basophils Absolute: 0.2 — ABNORMAL HIGH
Basophils Relative: 1
Basophils Relative: 2 — ABNORMAL HIGH
Eosinophils Absolute: 0.1
Lymphocytes Relative: 18
Monocytes Absolute: 0.5
Monocytes Relative: 8
Neutro Abs: 4.6
Neutrophils Relative %: 75

## 2010-10-16 LAB — URINE MICROSCOPIC-ADD ON

## 2010-11-25 ENCOUNTER — Encounter (HOSPITAL_COMMUNITY): Payer: Self-pay | Admitting: *Deleted

## 2010-11-25 ENCOUNTER — Inpatient Hospital Stay (HOSPITAL_COMMUNITY)
Admission: AD | Admit: 2010-11-25 | Discharge: 2010-11-25 | Disposition: A | Discharge status: Other Acute Inpatient Hospital | Payer: BC Managed Care – PPO | Source: Ambulatory Visit | Attending: Obstetrics and Gynecology | Admitting: Obstetrics and Gynecology

## 2010-11-25 DIAGNOSIS — R55 Syncope and collapse: Secondary | ICD-10-CM

## 2010-11-25 DIAGNOSIS — O4700 False labor before 37 completed weeks of gestation, unspecified trimester: Secondary | ICD-10-CM | POA: Insufficient documentation

## 2010-11-25 DIAGNOSIS — O2650 Maternal hypotension syndrome, unspecified trimester: Secondary | ICD-10-CM | POA: Insufficient documentation

## 2010-11-25 NOTE — Progress Notes (Signed)
Patient states she has been having contractions about every 10 minutes. No bleeding or leaking. Reports good fetal movement. Patient is scheduled for a primary cesarean section on 11-20 due to baby being breech.

## 2010-11-25 NOTE — Progress Notes (Signed)
Pt states pain started about 1700.and they are now coming about every 

## 2010-11-25 NOTE — Progress Notes (Signed)
Pt tilted on left side for comfort

## 2010-11-25 NOTE — Progress Notes (Signed)
Patient states she had a cerclage removed last week.

## 2010-12-07 ENCOUNTER — Other Ambulatory Visit: Payer: Self-pay | Admitting: Obstetrics

## 2010-12-09 ENCOUNTER — Encounter (HOSPITAL_COMMUNITY): Payer: Self-pay

## 2010-12-10 ENCOUNTER — Encounter (HOSPITAL_COMMUNITY)
Admission: RE | Admit: 2010-12-10 | Discharge: 2010-12-10 | Disposition: A | Discharge status: Home / Self Care | Payer: BC Managed Care – PPO | Source: Ambulatory Visit | Attending: Obstetrics | Admitting: Obstetrics

## 2010-12-10 ENCOUNTER — Encounter (HOSPITAL_COMMUNITY): Payer: Self-pay

## 2010-12-10 DIAGNOSIS — R55 Syncope and collapse: Secondary | ICD-10-CM

## 2010-12-10 LAB — URINE MICROSCOPIC-ADD ON

## 2010-12-10 LAB — SURGICAL PCR SCREEN: Staphylococcus aureus: NEGATIVE

## 2010-12-10 LAB — CBC
Hemoglobin: 12.6 g/dL (ref 12.0–15.0)
MCH: 30.1 pg (ref 26.0–34.0)
MCHC: 34.9 g/dL (ref 30.0–36.0)
MCV: 86.2 fL (ref 78.0–100.0)
Platelets: 200 10*3/uL (ref 150–400)
RBC: 4.19 MIL/uL (ref 3.87–5.11)

## 2010-12-10 LAB — URINALYSIS, ROUTINE W REFLEX MICROSCOPIC
Nitrite: NEGATIVE
Specific Gravity, Urine: 1.015 (ref 1.005–1.030)
pH: 5.5 (ref 5.0–8.0)

## 2010-12-10 LAB — BASIC METABOLIC PANEL
BUN: 8 mg/dL (ref 6–23)
CO2: 22 mEq/L (ref 19–32)
Calcium: 10.3 mg/dL (ref 8.4–10.5)
Glucose, Bld: 109 mg/dL — ABNORMAL HIGH (ref 70–99)
Sodium: 136 mEq/L (ref 135–145)

## 2010-12-10 LAB — TYPE AND SCREEN

## 2010-12-10 NOTE — Patient Instructions (Signed)
   Your procedure is scheduled on: Friday, November 16  Enter through the [employer] of [facility] at: 10:15 Pick up the phone at the desk and dial [phone] and inform us of your arrival.  Please call this number if you have any problems the morning of surgery: [phone]  Remember: Do not eat food after midnight: Thursday, November 15 Do not drink clear liquids after: 7:30 am  Take these medicines the morning of surgery with a SIP OF WATER: Synthroid Metformin  Do not wear jewelry, make-up, or FINGER nail polish Do not wear lotions, powders, or perfumes.  You may not  wear deodorant. Do not shave 48 hours prior to surgery. Do not bring valuables to the hospital.  Leave suitcase in the car. After Surgery it may be brought to your room. For patients being admitted to the hospital, checkout time is 11:00am the day of discharge.  Patients discharged on the day of surgery will not be allowed to drive home.    Remember to use your hibiclens as instructed.Please shower with 1/2 bottle the evening before your surgery and the other 1/2 bottle the morning of surgery.

## 2010-12-11 ENCOUNTER — Encounter (HOSPITAL_COMMUNITY): Payer: Self-pay | Admitting: Emergency Medicine

## 2010-12-11 ENCOUNTER — Encounter (HOSPITAL_COMMUNITY): Payer: Self-pay | Admitting: *Deleted

## 2010-12-11 ENCOUNTER — Inpatient Hospital Stay (HOSPITAL_COMMUNITY)
Admission: RE | Admit: 2010-12-11 | Discharge: 2010-12-14 | DRG: 371 | Disposition: A | Discharge status: Home / Self Care | Payer: BC Managed Care – PPO | Source: Ambulatory Visit | Attending: Obstetrics | Admitting: Obstetrics

## 2010-12-11 ENCOUNTER — Encounter (HOSPITAL_COMMUNITY): Payer: Self-pay | Admitting: Anesthesiology

## 2010-12-11 ENCOUNTER — Encounter (HOSPITAL_COMMUNITY)
Admission: RE | Disposition: A | Discharge status: Home / Self Care | Payer: Self-pay | Source: Ambulatory Visit | Attending: Obstetrics

## 2010-12-11 ENCOUNTER — Inpatient Hospital Stay (HOSPITAL_COMMUNITY): Payer: BC Managed Care – PPO | Admitting: Anesthesiology

## 2010-12-11 DIAGNOSIS — E039 Hypothyroidism, unspecified: Secondary | ICD-10-CM | POA: Diagnosis present

## 2010-12-11 DIAGNOSIS — O328XX0 Maternal care for other malpresentation of fetus, not applicable or unspecified: Principal | ICD-10-CM | POA: Diagnosis present

## 2010-12-11 DIAGNOSIS — O09529 Supervision of elderly multigravida, unspecified trimester: Secondary | ICD-10-CM | POA: Diagnosis present

## 2010-12-11 DIAGNOSIS — O3430 Maternal care for cervical incompetence, unspecified trimester: Secondary | ICD-10-CM | POA: Diagnosis present

## 2010-12-11 DIAGNOSIS — E079 Disorder of thyroid, unspecified: Secondary | ICD-10-CM | POA: Diagnosis present

## 2010-12-11 DIAGNOSIS — O99284 Endocrine, nutritional and metabolic diseases complicating childbirth: Secondary | ICD-10-CM | POA: Diagnosis present

## 2010-12-11 LAB — GLUCOSE, CAPILLARY: Glucose-Capillary: 81 mg/dL (ref 70–99)

## 2010-12-11 SURGERY — Surgical Case
Anesthesia: Spinal | Site: Uterus | Wound class: Clean Contaminated

## 2010-12-11 MED ORDER — PHENYLEPHRINE 40 MCG/ML (10ML) SYRINGE FOR IV PUSH (FOR BLOOD PRESSURE SUPPORT)
PREFILLED_SYRINGE | INTRAVENOUS | Status: AC
  Filled 2010-12-11: qty 5

## 2010-12-11 MED ORDER — KETOROLAC TROMETHAMINE 60 MG/2ML IM SOLN
60.0000 mg | Freq: Once | INTRAMUSCULAR | Status: AC | PRN
  Filled 2010-12-11: qty 2

## 2010-12-11 MED ORDER — EPHEDRINE SULFATE 50 MG/ML IJ SOLN
INTRAMUSCULAR | Status: DC | PRN
  Administered 2010-12-11 (×6): 5 mg via INTRAVENOUS

## 2010-12-11 MED ORDER — OXYTOCIN 20 UNITS IN LACTATED RINGERS INFUSION - SIMPLE
INTRAVENOUS | Status: DC | PRN
  Administered 2010-12-11: 20 [IU] via INTRAVENOUS

## 2010-12-11 MED ORDER — DIPHENHYDRAMINE HCL 50 MG/ML IJ SOLN: 25.0000 mg | INTRAMUSCULAR | Status: DC | PRN

## 2010-12-11 MED ORDER — OXYTOCIN 10 UNIT/ML IJ SOLN
INTRAMUSCULAR | Status: AC
  Filled 2010-12-11: qty 2

## 2010-12-11 MED ORDER — LACTATED RINGERS IV SOLN
INTRAVENOUS | Status: DC
  Administered 2010-12-12: 01:00:00 via INTRAVENOUS

## 2010-12-11 MED ORDER — CEFAZOLIN SODIUM 1-5 GM-% IV SOLN: 1.0000 g | INTRAVENOUS | Status: DC

## 2010-12-11 MED ORDER — LEVOTHYROXINE SODIUM 50 MCG PO TABS
50.0000 ug | ORAL_TABLET | Freq: Every day | ORAL | Status: DC
  Administered 2010-12-12 – 2010-12-14 (×3): 50 ug via ORAL
  Filled 2010-12-11 (×5): qty 1

## 2010-12-11 MED ORDER — ZOLPIDEM TARTRATE 5 MG PO TABS: 5.0000 mg | ORAL_TABLET | Freq: Every evening | ORAL | Status: DC | PRN

## 2010-12-11 MED ORDER — ONDANSETRON HCL 4 MG/2ML IJ SOLN
INTRAMUSCULAR | Status: AC
  Filled 2010-12-11: qty 2

## 2010-12-11 MED ORDER — SCOPOLAMINE 1 MG/3DAYS TD PT72
MEDICATED_PATCH | TRANSDERMAL | Status: AC
  Administered 2010-12-11: 1.5 mg
  Filled 2010-12-11: qty 1

## 2010-12-11 MED ORDER — KETOROLAC TROMETHAMINE 30 MG/ML IJ SOLN
15.0000 mg | Freq: Once | INTRAMUSCULAR | Status: AC | PRN
  Administered 2010-12-11: 30 mg via INTRAVENOUS

## 2010-12-11 MED ORDER — SODIUM CHLORIDE 0.9 % IV SOLN: 1.0000 ug/kg/h | INTRAVENOUS | Status: DC | PRN

## 2010-12-11 MED ORDER — NALBUPHINE HCL 10 MG/ML IJ SOLN: 5.0000 mg | INTRAMUSCULAR | Status: DC | PRN

## 2010-12-11 MED ORDER — TETANUS-DIPHTH-ACELL PERTUSSIS 5-2.5-18.5 LF-MCG/0.5 IM SUSP: 0.5000 mL | Freq: Once | INTRAMUSCULAR | Status: DC

## 2010-12-11 MED ORDER — DIPHENHYDRAMINE HCL 25 MG PO CAPS: 25.0000 mg | ORAL_CAPSULE | ORAL | Status: DC | PRN

## 2010-12-11 MED ORDER — ONDANSETRON HCL 4 MG/2ML IJ SOLN
INTRAMUSCULAR | Status: DC | PRN
  Administered 2010-12-11: 4 mg via INTRAVENOUS

## 2010-12-11 MED ORDER — MORPHINE SULFATE (PF) 0.5 MG/ML IJ SOLN
INTRAMUSCULAR | Status: DC | PRN
  Administered 2010-12-11: 150 ug via INTRATHECAL

## 2010-12-11 MED ORDER — SENNOSIDES-DOCUSATE SODIUM 8.6-50 MG PO TABS
2.0000 | ORAL_TABLET | Freq: Every day | ORAL | Status: DC
  Administered 2010-12-12 – 2010-12-13 (×2): 2 via ORAL

## 2010-12-11 MED ORDER — HYDROMORPHONE HCL PF 1 MG/ML IJ SOLN
0.2500 mg | INTRAMUSCULAR | Status: DC | PRN
  Administered 2010-12-11 (×2): 0.5 mg via INTRAVENOUS

## 2010-12-11 MED ORDER — IBUPROFEN 600 MG PO TABS
600.0000 mg | ORAL_TABLET | Freq: Four times a day (QID) | ORAL | Status: DC
  Administered 2010-12-12 – 2010-12-14 (×10): 600 mg via ORAL
  Filled 2010-12-11 (×10): qty 1

## 2010-12-11 MED ORDER — SIMETHICONE 80 MG PO CHEW: 80.0000 mg | CHEWABLE_TABLET | ORAL | Status: DC | PRN

## 2010-12-11 MED ORDER — MENTHOL 3 MG MT LOZG: 1.0000 | LOZENGE | OROMUCOSAL | Status: DC | PRN

## 2010-12-11 MED ORDER — ONDANSETRON HCL 4 MG/2ML IJ SOLN: 4.0000 mg | INTRAMUSCULAR | Status: DC | PRN

## 2010-12-11 MED ORDER — DIPHENHYDRAMINE HCL 50 MG/ML IJ SOLN: 12.5000 mg | INTRAMUSCULAR | Status: DC | PRN

## 2010-12-11 MED ORDER — LACTATED RINGERS IV SOLN
INTRAVENOUS | Status: DC | PRN
  Administered 2010-12-11 (×3): via INTRAVENOUS

## 2010-12-11 MED ORDER — DIPHENHYDRAMINE HCL 25 MG PO CAPS: 25.0000 mg | ORAL_CAPSULE | Freq: Four times a day (QID) | ORAL | Status: DC | PRN

## 2010-12-11 MED ORDER — SODIUM CHLORIDE 0.9 % IJ SOLN: 3.0000 mL | INTRAMUSCULAR | Status: DC | PRN

## 2010-12-11 MED ORDER — METOCLOPRAMIDE HCL 5 MG/ML IJ SOLN: 10.0000 mg | Freq: Three times a day (TID) | INTRAMUSCULAR | Status: DC | PRN

## 2010-12-11 MED ORDER — CEFAZOLIN SODIUM 1-5 GM-% IV SOLN
INTRAVENOUS | Status: AC
  Administered 2010-12-11: 1 g via INTRAVENOUS
  Filled 2010-12-11: qty 50

## 2010-12-11 MED ORDER — METFORMIN HCL ER 750 MG PO TB24
750.0000 mg | ORAL_TABLET | Freq: Two times a day (BID) | ORAL | Status: DC
  Administered 2010-12-12 – 2010-12-14 (×4): 750 mg via ORAL
  Filled 2010-12-11 (×8): qty 1

## 2010-12-11 MED ORDER — KETOROLAC TROMETHAMINE 30 MG/ML IJ SOLN
INTRAMUSCULAR | Status: AC
  Administered 2010-12-11: 30 mg via INTRAVENOUS
  Filled 2010-12-11: qty 1

## 2010-12-11 MED ORDER — LANOLIN HYDROUS EX OINT: 1.0000 "application " | TOPICAL_OINTMENT | CUTANEOUS | Status: DC | PRN

## 2010-12-11 MED ORDER — OXYCODONE-ACETAMINOPHEN 5-325 MG PO TABS
1.0000 | ORAL_TABLET | ORAL | Status: DC | PRN
  Administered 2010-12-12: 2 via ORAL
  Administered 2010-12-12 (×2): 1 via ORAL
  Administered 2010-12-13: 2 via ORAL
  Administered 2010-12-14: 1 via ORAL
  Filled 2010-12-11 (×2): qty 1
  Filled 2010-12-11 (×2): qty 2
  Filled 2010-12-11: qty 1

## 2010-12-11 MED ORDER — PHENYLEPHRINE HCL 10 MG/ML IJ SOLN
INTRAMUSCULAR | Status: DC | PRN
  Administered 2010-12-11 (×3): 40 ug via INTRAVENOUS

## 2010-12-11 MED ORDER — HYDROMORPHONE HCL PF 1 MG/ML IJ SOLN
INTRAMUSCULAR | Status: AC
  Administered 2010-12-11: 0.5 mg via INTRAVENOUS
  Filled 2010-12-11: qty 1

## 2010-12-11 MED ORDER — DIBUCAINE 1 % RE OINT: 1.0000 "application " | TOPICAL_OINTMENT | RECTAL | Status: DC | PRN

## 2010-12-11 MED ORDER — MEPERIDINE HCL 25 MG/ML IJ SOLN: 6.2500 mg | INTRAMUSCULAR | Status: DC | PRN

## 2010-12-11 MED ORDER — SIMETHICONE 80 MG PO CHEW
80.0000 mg | CHEWABLE_TABLET | Freq: Three times a day (TID) | ORAL | Status: DC
  Administered 2010-12-12 – 2010-12-13 (×3): 80 mg via ORAL

## 2010-12-11 MED ORDER — SCOPOLAMINE 1 MG/3DAYS TD PT72: 1.0000 | MEDICATED_PATCH | Freq: Once | TRANSDERMAL | Status: DC

## 2010-12-11 MED ORDER — NALOXONE HCL 0.4 MG/ML IJ SOLN: 0.4000 mg | INTRAMUSCULAR | Status: DC | PRN

## 2010-12-11 MED ORDER — KETOROLAC TROMETHAMINE 30 MG/ML IJ SOLN
30.0000 mg | Freq: Four times a day (QID) | INTRAMUSCULAR | Status: AC | PRN
  Administered 2010-12-11: 30 mg via INTRAVENOUS
  Filled 2010-12-11: qty 1

## 2010-12-11 MED ORDER — EPHEDRINE 5 MG/ML INJ
INTRAVENOUS | Status: AC
  Filled 2010-12-11: qty 10

## 2010-12-11 MED ORDER — PRENATAL PLUS 27-1 MG PO TABS
1.0000 | ORAL_TABLET | Freq: Every day | ORAL | Status: DC
  Administered 2010-12-12 – 2010-12-13 (×2): 1 via ORAL
  Filled 2010-12-11 (×2): qty 1

## 2010-12-11 MED ORDER — FENTANYL CITRATE 0.05 MG/ML IJ SOLN
INTRAMUSCULAR | Status: AC
  Filled 2010-12-11: qty 2

## 2010-12-11 MED ORDER — IBUPROFEN 600 MG PO TABS: 600.0000 mg | ORAL_TABLET | Freq: Four times a day (QID) | ORAL | Status: DC | PRN

## 2010-12-11 MED ORDER — ONDANSETRON HCL 4 MG/2ML IJ SOLN: 4.0000 mg | Freq: Three times a day (TID) | INTRAMUSCULAR | Status: DC | PRN

## 2010-12-11 MED ORDER — WITCH HAZEL-GLYCERIN EX PADS: 1.0000 "application " | MEDICATED_PAD | CUTANEOUS | Status: DC | PRN

## 2010-12-11 MED ORDER — OXYTOCIN 20 UNITS IN LACTATED RINGERS INFUSION - SIMPLE
125.0000 mL/h | INTRAVENOUS | Status: AC
  Administered 2010-12-11: 125 mL/h via INTRAVENOUS
  Filled 2010-12-11: qty 1000

## 2010-12-11 MED ORDER — FENTANYL CITRATE 0.05 MG/ML IJ SOLN
INTRAMUSCULAR | Status: DC | PRN
  Administered 2010-12-11: 25 ug via INTRATHECAL

## 2010-12-11 MED ORDER — MORPHINE SULFATE 0.5 MG/ML IJ SOLN
INTRAMUSCULAR | Status: AC
  Filled 2010-12-11: qty 10

## 2010-12-11 MED ORDER — KETOROLAC TROMETHAMINE 30 MG/ML IJ SOLN: 30.0000 mg | Freq: Four times a day (QID) | INTRAMUSCULAR | Status: AC | PRN

## 2010-12-11 MED ORDER — ONDANSETRON HCL 4 MG PO TABS: 4.0000 mg | ORAL_TABLET | ORAL | Status: DC | PRN

## 2010-12-11 SURGICAL SUPPLY — 25 items
CHLORAPREP W/TINT 26ML (MISCELLANEOUS) ×2 IMPLANT
CLOTH BEACON ORANGE TIMEOUT ST (SAFETY) ×2 IMPLANT
DRSG COVADERM 4X10 (GAUZE/BANDAGES/DRESSINGS) ×1 IMPLANT
ELECT REM PT RETURN 9FT ADLT (ELECTROSURGICAL) ×2
ELECTRODE REM PT RTRN 9FT ADLT (ELECTROSURGICAL) ×1 IMPLANT
GLOVE BIO SURGEON STRL SZ 6.5 (GLOVE) ×2 IMPLANT
GLOVE BIOGEL PI IND STRL 7.0 (GLOVE) ×2 IMPLANT
GLOVE BIOGEL PI INDICATOR 7.0 (GLOVE) ×2
GOWN PREVENTION PLUS LG XLONG (DISPOSABLE) ×6 IMPLANT
KIT ABG SYR 3ML LUER SLIP (SYRINGE) ×1 IMPLANT
NDL HYPO 25X5/8 SAFETYGLIDE (NEEDLE) IMPLANT
NEEDLE HYPO 25X5/8 SAFETYGLIDE (NEEDLE) ×2 IMPLANT
NS IRRIG 1000ML POUR BTL (IV SOLUTION) ×2 IMPLANT
PACK C SECTION WH (CUSTOM PROCEDURE TRAY) ×2 IMPLANT
STAPLER VISISTAT 35W (STAPLE) ×1 IMPLANT
SUT MON AB 4-0 PS1 27 (SUTURE) IMPLANT
SUT PLAIN 2 0 XLH (SUTURE) IMPLANT
SUT VIC AB 0 CT1 36 (SUTURE) ×4 IMPLANT
SUT VIC AB 0 CTX 36 (SUTURE) ×6
SUT VIC AB 0 CTX36XBRD ANBCTRL (SUTURE) ×3 IMPLANT
SUT VIC AB 2-0 CT1 27 (SUTURE) ×2
SUT VIC AB 2-0 CT1 TAPERPNT 27 (SUTURE) ×1 IMPLANT
TOWEL OR 17X24 6PK STRL BLUE (TOWEL DISPOSABLE) ×4 IMPLANT
TRAY FOLEY CATH 14FR (SET/KITS/TRAYS/PACK) ×1 IMPLANT
WATER STERILE IRR 1000ML POUR (IV SOLUTION) ×2 IMPLANT

## 2010-12-11 NOTE — Anesthesia Postprocedure Evaluation (Signed)
  Anesthesia Post-op Note  Patient: Jasmine Howard  Procedure(s) Performed:  CESAREAN SECTION  Patient is awake, responsive, moving her legs, and has signs of resolution of her numbness. Pain and nausea are reasonably well controlled. Vital signs are stable and clinically acceptable. Oxygen saturation is clinically acceptable. There are no apparent anesthetic complications at this time. Patient is ready for discharge.

## 2010-12-11 NOTE — Op Note (Signed)
Preoperative diagnosis: 39 wk IUP, breech presentation  Postop diagnosis:  same Procedure: PCS w/ breech extraction Surgeon: Viviann Spare Assistant:. Augusto Garbe, CNM Anesthesia: spinal Findings:  @BABYSEXEBC @ infant,  APGAR (1 MIN): 7   APGAR (5 MINS): 9   APGAR (10 MINS):   @BABYWGTLBSEBC @ @BABYWGTOZEBC @ 8'4, frank breech delivered as footling breech, nl uterus, tubes, ovaries, nl placenta, 3 VC EBL: 800 cc Antibiotics:  1g Ancef  Complications: none  Indications: This is a 35 y.o. year-old, G2P0100  At [redacted]w[redacted]d admitted for [facility] for breech. Risks benefits and alternatives of the procedure were discussed with the patient who agreed to proceed  Procedure:  After informed consent was obtained the patient was taken to the operating room where spinal anesthesia was administered.  She was prepped and draped in the normal sterile fashion in dorsal supine position with a leftward tilt.  A foley catheter was inserted sterilely into the bladder. Gloves were changes and attention was turned to the patient's abdomen. A Pfannenstiel skin incision was made 2 cm above the pubic symphysis in the midline with the scalpel.  Dissection was carried down with the Bovie cautery until the fascia was reached. The fascia was incised in the midline. The incision was extended laterally with the Mayo scissors. The inferior aspect of the fascial incision was grasped with the Coker clamps, elevated up and the underlying rectus muscles were dissected off sharply. The superior aspect of the fascial incision was grasped with the Coker clamps elevated up and the underlying rectus muscles were dissected off sharply.  The peritoneum was entered bluntly. The peritoneal incision was extended superiorly and inferiorly with good visualization of the bladder. The bladder blade was inserted, the vesicouterine peritoneum was identified grasped with the pickups and entered sharply. The bladder flap was created digitally the bladder blade was  reinserted. Palpation was done to assess the fetal position and the location of the uterine vessels. The lower segment of the uterus was incised sharply with the scalpel and extended superiorly and laterally with the bandage scissors. The breech was grasped and brought to the incision however w/ fundal pressure the breech would rotate and I was unable to deliver the breech. Several attempts were made and then I decided to deliver the baby as footling breech. The R leg was grasped, brought to the incision, the left leg followed. Hips delivered easily, the infant was wrapped in a surgical towel. The L arm was swept across the chest and delivered. The R arm followed after rotating the infant and the head was delivered in flexion.  The cord was clamped and cut. The infant was handed off to the waiting pediatrician. The placenta was expressed. The uterus was left in-situ. The uterus was cleared of all clots and debris. The uterine incision was repaired with 0 Vicryl in a running locked fashion.  A second layer of the same suture was used in an imbricating fashion to obtain excellent hemostasis. 2, additional figure of 8 sutures were used to control bleeding.The gutters were cleared of all clots and debris. The tubes and ovaries were inspected and found to be hemostatic. The uterine incision was reinspected and found to be hemostatic. The peritoneum was grasped and closed with 2-0 Vicryl in a running fashion. The cut muscle edges and the underside of the fascia were inspected and found to be hemostatic. The fascia was closed with 0 Vicryl in two halves. The subcutaneous tissue was irrigated. Scarpa's layer was closed with a 2-0 plain gut suture. The skin  was closed with staples The patient tolerated the procedure well. Sponge lap and needle counts were correct x3 and patient was taken to the recovery room in a stable condition.  Brittanie Dosanjh A. 12/11/2010 1:25 PM

## 2010-12-11 NOTE — Addendum Note (Signed)
Addendum  created 12/11/10 1859 by Ethelwyn Gilbertson L. Rodman Pickle, MD   Modules edited:Orders, PRL Based Order Sets

## 2010-12-11 NOTE — Anesthesia Procedure Notes (Signed)

## 2010-12-11 NOTE — Transfer of Care (Signed)
Immediate Anesthesia Transfer of Care Note  Patient: Jasmine Howard  Procedure(s) Performed:  CESAREAN SECTION  Patient Location: PACU  Anesthesia Type: Spinal  Level of Consciousness: awake  Airway & Oxygen Therapy: Patient Spontanous Breathing  Post-op Assessment: Report given to PACU RN  Post vital signs: Reviewed and stable  Complications: No apparent anesthesia complications

## 2010-12-11 NOTE — Brief Op Note (Signed)
12/11/2010  1:24 PM  PATIENT:  Jasmine Howard  35 y.o. female  PRE-OPERATIVE DIAGNOSIS:  Breech Presentation  POST-OPERATIVE DIAGNOSIS:  Breech Presentation  PROCEDURE:  Procedure(s): CESAREAN SECTION  SURGEON:  Surgeon(s): [facility]. Burhan Barham  PHYSICIAN ASSISTANT:   ASSISTANTS: Marlinda Mike, CNM   ANESTHESIA:   spinal  EBL:  Total I/O In: 2400 [I.V.:2400] Out: 1100 [Urine:300; Blood:800]  BLOOD ADMINISTERED:none  DRAINS: Urinary Catheter (Foley)   LOCAL MEDICATIONS USED:  NONE  SPECIMEN:  Source of Specimen:  placenta  DISPOSITION OF SPECIMEN:  L&D  COUNTS:  YES  TOURNIQUET:  * No tourniquets in log *  DICTATION: .Note written in EPIC  PLAN OF CARE: Admit to inpatient   PATIENT DISPOSITION:  PACU - hemodynamically stable.   Delay start of Pharmacological VTE agent (>24hrs) due to surgical blood loss or risk of bleeding:  {YES/NO/NOT APPLICABLE:20182

## 2010-12-11 NOTE — H&P (Signed)
Jasmine Howard is a 35 y.o. G2P0100 at [redacted]w[redacted]d presenting for [facility] for breech. Pt notes mild contractions for the past few days . Good fetal movement, No vaginal bleeding, not leaking fluid.  [facility] at [facility] since 6 wks - h/o 22 wk loss secondary to cervical incompetence. Cerclage and close monitoring this preg, no 17-P, cerclage removed at 36 wks - IGT, PCOS, ? GDM (borderline 1 hr GTT). On metformin since the start of preg, intermittently checking BS, nl A1C checked q 49mo - Breech, declined version - AMA, nl NT, nl AFP, nl anatomy - hypothyroidism, TSH stable q 6-8 wks, on Synthroid - syncope midtrimester, several episodes syncope, s/p cards eval, nl echo, nl CT angio  OB History    Grav Para Term Preterm Abortions TAB SAB Ect Mult Living   2 1 0 1 0 0 0 0 0 0      Past Medical History  Diagnosis Date  . Abdominal pain, right lower quadrant 03/07/2009  . ANEMIA-NOS 09/26/2008  . BENIGN POSITIONAL VERTIGO 12/10/2009  . CHICKENPOX, HX OF 09/26/2008  . CERVICAL INCOMPETENCE 02/24/2008  . DEPRESSION 09/26/2008  . FATTY LIVER DISEASE 03/31/2009  . HEMATOCHEZIA 09/26/2008  . HYPOTHYROIDISM 12/10/2009  . Irritable bowel syndrome 03/31/2009  . IRITIS 03/07/2009  . DIABETES MELLITUS, TYPE II 12/10/2009    metformin  . Polycystic ovaries   . Infertility, female   . Cervical incompetence    Past Surgical History  Procedure Date  . Cholecystectomy 2009   Family History: family history includes Alcohol abuse in her other and Arthritis in her other. Social History:  reports that she has never smoked. She has never used smokeless tobacco. She reports that she does not drink alcohol or use illicit drugs. primary Spanish speaking, understands English well  Review of Systems - Negative except contractions     Blood pressure 120/67, pulse 71, temperature 98.6 F (37 C), temperature source Oral, resp. rate 18, SpO2 100.00%.  Physical Exam:  Gen: well appearing, no distress CV:  RRR Pulm: CTAB Back: no CVAT Abd: gravid, NT, no RUQ pain LE: no edema, equal bilaterally, non-tender Breech by [facility] FH present   Prenatal labs: ABO, Rh: --/--/B POS, B POS (11/15 1215) Antibody: NEG (11/15 1215) Rubella:  immune RPR: NON REACTIVE (11/15 1215)  HBsAg: Negative (04/25 0000)  HIV: Non-reactive (04/25 0000)  GBS:   neg 1 hr Glucola 133 (on metformin)  Genetic screening nl NT, nl AFP Anatomy US nl   Assessment/Plan: 35 y.o. G2P0100 at [redacted]w[redacted]d - Breech, planning PCS, R/B d/w pt - PCOS, IGT, Cont metformin PP   Jasmine Howard A. 12/11/2010, 11:49 AM

## 2010-12-11 NOTE — Anesthesia Preprocedure Evaluation (Signed)
Anesthesia Evaluation  Patient identified by MRN, date of birth, ID band Patient awake    Reviewed: Allergy & Precautions, H&P , Patient's Chart, lab work & pertinent test results  Airway Mallampati: II TM Distance: >3 FB Neck ROM: full    Dental No notable dental hx. (+) Caps   Pulmonary  clear to auscultation  Pulmonary exam normal       Cardiovascular Exercise Tolerance: Good regular Normal    Neuro/Psych    GI/Hepatic   Endo/Other  Diabetes mellitus-, Well Controlled, Type obesity  Renal/GU      Musculoskeletal   Abdominal   Peds  Hematology   Anesthesia Other Findings   Reproductive/Obstetrics                           Anesthesia Physical Anesthesia Plan  ASA: III  Anesthesia Plan: Spinal   Post-op Pain Management:    Induction:   Airway Management Planned:   Additional Equipment:   Intra-op Plan:   Post-operative Plan:   Informed Consent: I have reviewed the patients History and Physical, chart, labs and discussed the procedure including the risks, benefits and alternatives for the proposed anesthesia with the patient or authorized representative who has indicated his/her understanding and acceptance.   Dental Advisory Given  Plan Discussed with: CRNA  Anesthesia Plan Comments: (Lab work confirmed with CRNA in room. Platelets okay. Discussed spinal anesthetic, and patient consents to the procedure:  included risk of possible headache,backache, failed block, allergic reaction, and nerve injury. This patient was asked if she had any questions or concerns before the procedure started. )        Anesthesia Quick Evaluation

## 2010-12-12 ENCOUNTER — Encounter (HOSPITAL_COMMUNITY): Payer: Self-pay

## 2010-12-12 LAB — GLUCOSE, RANDOM: Glucose, Bld: 89 mg/dL (ref 70–99)

## 2010-12-12 LAB — CBC
Platelets: 155 10*3/uL (ref 150–400)
RDW: 13.9 % (ref 11.5–15.5)
WBC: 8.4 10*3/uL (ref 4.0–10.5)

## 2010-12-12 LAB — GLUCOSE, CAPILLARY: Glucose-Capillary: 98 mg/dL (ref 70–99)

## 2010-12-12 NOTE — Addendum Note (Signed)
Addendum  created 12/12/10 0920 by Quin Hoop Ziza Hastings   Modules edited:Notes Section

## 2010-12-12 NOTE — Anesthesia Postprocedure Evaluation (Signed)
  Anesthesia Post-op Note  Patient: Jasmine Howard  Procedure(s) Performed:  CESAREAN SECTION  Patient Location: Mother/Baby  Anesthesia Type: Spinal  Level of Consciousness: awake, alert  and oriented  Airway and Oxygen Therapy: Patient Spontanous Breathing  Post-op Pain: mild  Post-op Assessment: Patient's Cardiovascular Status Stable, Respiratory Function Stable, Patent Airway, No signs of Nausea or vomiting and Pain level controlled  Post-op Vital Signs: stable  Complications: No apparent anesthesia complications

## 2010-12-12 NOTE — Progress Notes (Signed)
  Subjective: POD# 1 Information for the patient's newborn:  Jasmine Howard, Jasmine Howard [[id number]]  female    Reports feeling well, a little sore during ambulation Feeding: breast Patient reports tolerating PO.  Breast symptoms sore with latch Pain controlled withibuprofen (OTC) and narcotic analgesics including percocet Denies HA/SOB/C/P/N/V/dizziness. Flatus none. She reports vaginal bleeding as normal, without clots.  She is ambulating, urinating without difficult.     Objective:   VS: Blood pressure 92/56, pulse 79, temperature 98.7 F (37.1 C), temperature source Oral, resp. rate 20, weight 97.523 kg (215 lb), SpO2 97.00%, unknown if currently breastfeeding.   Intake/Output Summary (Last 24 hours) at 12/12/10 0854 Last data filed at 12/12/10 3664  Gross per 24 hour  Intake   2820 ml  Output   3700 ml  Net   -880 ml        Basename 12/12/10 0546 12/10/10 1215  WBC 8.4 8.1  HGB 10.3* 12.6  HCT 30.0* 36.1  PLT 155 200     Blood type: --/--/B POS, B POS (11/15 1215)  Rubella: Immune (04/25 0000)     Physical Exam:  General: alert, cooperative and appears stated age CV: Regular rate and rhythm, S1S2 present or without murmur or extra heart sounds Resp: clear Abdomen: soft, nontender, normal bowel sounds; slight distension Incision: dry, intact and dressing to LTS Uterine Fundus: firm, below umbilicus, nontender Lochia: minimal Ext: Homans sign is negative, no sign of DVT      Assessment/Plan: 35 y.o.  status post Cesarean section. POD# 2.  s/p Cesarean Delivery.  Indications: breech                Principal Problem:  *Postpartum care following cesarean delivery (breech 11/16) Breastfeeding with assistance  Doing well, stable.    Lactation consultation today           Ambulate/ warm po fluids to increase GI motility Routine post-op care  Juanetta Beets, SNM [facility] 12/12/2010, 8:54 AM

## 2010-12-12 NOTE — Progress Notes (Signed)
Foley  Cath removed by nurse tech at [phone].  Patient tried twice and unable to void by 0030.  In and out cath at 0100 for 900 cc of urine.  Patient tolerated well.  Fredrik Cove, RN, MSN.

## 2010-12-13 LAB — GLUCOSE, CAPILLARY: Glucose-Capillary: 86 mg/dL (ref 70–99)

## 2010-12-13 NOTE — Progress Notes (Signed)
  Subjective: POD# 2 SP LTCS  Reports feeling much better this am; reports sore nipples with latch Feeding: breastfeeding with assistance Patient reports tolerating PO.  Breast symptoms- per HPI; denies engorgement Pain controlled withibuprofen (OTC) and narcotic analgesics including percocet Denies HA/SOB/C/P/N/V/dizziness. Flatus +; BM-none. She reports vaginal bleeding as normal, without clots.  She is ambulating, urinating without difficult.     Objective:   VS: Blood pressure 105/64, pulse 62, temperature 98.3 F (36.8 C), temperature source Oral, resp. rate 18, weight 97.523 kg (215 lb), SpO2 95.00%, unknown if currently breastfeeding.  No intake or output data in the 24 hours ending 12/13/10 0842      Basename 12/12/10 0546 12/10/10 1215  WBC 8.4 8.1  HGB 10.3* 12.6  HCT 30.0* 36.1  PLT 155 200     Blood type: --/--/B POS, B POS (11/15 1215)  Rubella: Immune (04/25 0000)     Physical Exam:  General: alert, cooperative, appears stated age and no distress CV: Regular rate and rhythm, S1S2 present or without murmur or extra heart sounds Resp: unlabored; CTAB Abdomen: soft, nontender, active bowel sounds Incision: clean, dry, and well approximated with staples Uterine Fundus: firm, below umbilicus, nontender Lochia: minimal Ext: Homans sign is negative, no sign of DVT and no edema, redness or tenderness in the calves or thighs   Assessment/Plan: 35 y.o.  status post Cesarean section. POD# 2.  s/p Cesarean Delivery.  Indications: breech                Principal Problem:  *Postpartum care following cesarean delivery (breech 11/16) Routine post-op care Doing well, stable postpartum/postoperative status.  Breastfeeding difficulty with latch-will continue to work with [facility] and recommend hydrogel's to nipples prn.  Acute blood loss anemia- stable status-resume PNV, reinforced healthy nutrition            Plan for DC on 11/19  Juanetta Beets, SNM [facility] 12/13/2010,  8:42 AM

## 2010-12-14 ENCOUNTER — Encounter (HOSPITAL_COMMUNITY): Payer: Self-pay | Admitting: Obstetrics

## 2010-12-14 LAB — GLUCOSE, CAPILLARY: Glucose-Capillary: 72 mg/dL (ref 70–99)

## 2010-12-14 MED ORDER — IBUPROFEN 600 MG PO TABS: 600.0000 mg | ORAL_TABLET | Freq: Four times a day (QID) | ORAL | Status: AC

## 2010-12-14 MED ORDER — OXYCODONE-ACETAMINOPHEN 5-325 MG PO TABS: 2.0000 | ORAL_TABLET | ORAL | Status: AC | PRN

## 2010-12-14 NOTE — Discharge Summary (Signed)
POSTOPERATIVE DISCHARGE SUMMARY:  Patient ID: Jasmine Howard MRN: [id number] DOB/AGE: [birth year] 35 y.o.  Admit date: 12/11/2010 Discharge date:  12/14/2010  Admission Diagnoses:  39 weeks with previous fetal loss / breech presentation / hypothyroidism / PCOS    Discharge Diagnoses:   Term Pregnancy-delivered and hypothyroidism and PCOS  Prenatal history: G2P1101   EDC : 12/17/2010, by Other Basis  Prenatal care at [facility] since [redacted] weeks gestation with primary Dr Ernestina Penna  Prenatal course complicated by history of fetal loss, cervical incompetence (cerclage placement this pregnancy), hypothyroidism, PCOS, mild anxiety  Prenatal Labs: ABO, Rh: B (04/25 0000)  Antibody: NEG (11/15 1215) Rubella: Immune (04/25 0000)  RPR: NON REACTIVE (11/15 1215)  HBsAg: Negative (04/25 0000)  HIV: Non-reactive (04/25 0000)  GBS:   Negative 1 hr Glucola : passed - altered glucose metabolism with PCOS   Medical / Surgical History :  Past medical history:  Past Medical History  Diagnosis Date  . Abdominal pain, right lower quadrant 03/07/2009  . ANEMIA-NOS 09/26/2008  . BENIGN POSITIONAL VERTIGO 12/10/2009  . CHICKENPOX, HX OF 09/26/2008  . CERVICAL INCOMPETENCE 02/24/2008  . DEPRESSION 09/26/2008  . FATTY LIVER DISEASE 03/31/2009  . HEMATOCHEZIA 09/26/2008  . HYPOTHYROIDISM 12/10/2009  . Irritable bowel syndrome 03/31/2009  . IRITIS 03/07/2009  . DIABETES MELLITUS, TYPE II 12/10/2009    metformin  . Polycystic ovaries   . Infertility, female   . Cervical incompetence   . Postpartum care following cesarean delivery (breech 11/16) 12/12/2010    Past surgical history:  Past Surgical History  Procedure Date  . Cholecystectomy 2009    Family History:  Family History  Problem Relation Age of Onset  . Arthritis Other     Parent  . Alcohol abuse Other     Parent    Social History:  reports that she has never smoked. She has never used smokeless tobacco. She reports  that she does not drink alcohol or use illicit drugs.   Allergies: Review of patient's allergies indicates no known allergies.    Current Medications at time of admission:  Prenatal vitamin daily Levothyroxine 50 mcg daily Metformin 750mg  BID  Intrapartum Course: none  Procedures: Cesarean section delivery of female newborn by Dr Ernestina Penna  See operative report for further details  Postoperative / postpartum course: uneventful  Physical Exam:  VSS: Blood pressure 117/65, pulse 78, temperature 98.1 F (36.7 C), temperature source Oral, resp. rate 18, weight 97.523 kg (215 lb), SpO2 95.00%, unknown if currently breastfeeding.   LABS:  Lab Results  Component Value Date   WBC 8.4 12/12/2010   HGB 10.3* 12/12/2010   HCT 30.0* 12/12/2010   MCV 86.5 12/12/2010   PLT 155 12/12/2010    Incision:  approximated with staples - well approximated / no erythema / no ecchymosis / no drainage Staples: removed / benzoin and steri-strips applied  Discharge Instructions:  Discharged Condition: good Activity: pelvic rest and postoperative restrictions x 2 weeks Diet: low carb diet Medications: PNV, Ibuprofen, Percocet and Levothyroxine, and metformin Current Discharge Medication List    START taking these medications   Details  ibuprofen (ADVIL,MOTRIN) 600 MG tablet Take 1 tablet (600 mg total) by mouth every 6 (six) hours. Qty: 30 tablet, Refills: 0    oxyCODONE-acetaminophen (PERCOCET) 5-325 MG per tablet Take 2 tablets by mouth every 4 (four) hours as needed (moderate - severe pain). Qty: 30 tablet, Refills: 0      CONTINUE these medications which have NOT CHANGED  Details  levothyroxine (SYNTHROID, LEVOTHROID) 50 MCG tablet Take 50 mcg by mouth daily.      metFORMIN (GLUCOPHAGE-XR) 750 MG 24 hr tablet Take 750 mg by mouth 2 (two) times daily.      Multiple Vitamin (MULTIVITAMIN) tablet Take 1 tablet by mouth daily.      prenatal vitamin w/FE, FA (PRENATAL 1 + 1) 27-1 MG  TABS Take 1 tablet by mouth daily.        STOP taking these medications     glucose blood (ONE TOUCH ULTRA TEST) test strip      ONE TOUCH LANCETS MISC        Condition: stable Postpartum Instructions: refer to practice specific booklet Discharge to: home Disposition: Home with family  Follow up :  Follow-up Information    Follow up with [facility] Make an appointment in 6 weeks.   Contact information:   [address] [place] [address] [phone]           Signed: Marlinda Mike 12/14/2010, 10:23 AM

## 2010-12-14 NOTE — Progress Notes (Signed)
  S:         Reports feeling tired - breasts tender from cluster nursing last night             Tolerating po intake / no nausea / no vomiting / + flatus / no BM             Bleeding is light             Pain -  prescription NSAID's including motrin and narcotic analgesics including percocet             Up ad lib / ambulatory  Newborn breast feeding  / female newborn   O:  A & O x 3 NAD             VS: Blood pressure 117/65, pulse 78, temperature 98.1 F (36.7 C), temperature source Oral, resp. rate 18, weight 97.523 kg (215 lb), SpO2 95.00%, unknown if currently breastfeeding.  LABS:  Lab Results  Component Value Date   WBC 8.4 12/12/2010   HGB 10.3* 12/12/2010   HCT 30.0* 12/12/2010   MCV 86.5 12/12/2010   PLT 155 12/12/2010     Lungs: Clear and unlabored  Heart: regular rate and rhythm / no mumurs  Abdomen: soft, non-tender, non-distended, active bowel sounds             Fundus: firm, non-tender, Ueven             Dressing OFF              Incision:  approximated with staples / no erythema / no ecchymosis / no drainage                            Staples removed / edges well-approximated /  benzoin and steri-strips applied   Perineum: no edema  Lochia: light  Extremities: trace edema, no calf pain or tenderness, negative Homans  A:        POD # 3 S/P cesarean section for breech            Stable status  P:        Routine postoperative care              Discharge home today after lactation visit     Jasmine Howard 12/14/2010, 10:15 AM

## 2011-01-29 IMAGING — US US PELVIS COMPLETE MODIFY
1 series · 14 of 25 positions shown · non-contrast
Comparison: CT abdomen and pelvis 03/21/2009.

CLINICAL DATA: Pain.

TRANSABDOMINAL AND TRANSVAGINAL ULTRASOUND OF PELVIS
TECHNIQUE: Both transabdominal and transvaginal ultrasound
examinations of the pelvis were performed including evaluation of
the uterus, ovaries, adnexal regions, and pelvic cul-de-sac.

[Series 1: us pelvis complete modify · 0.30mm/px · 14 of 51 slices shown]
[im 1/51]
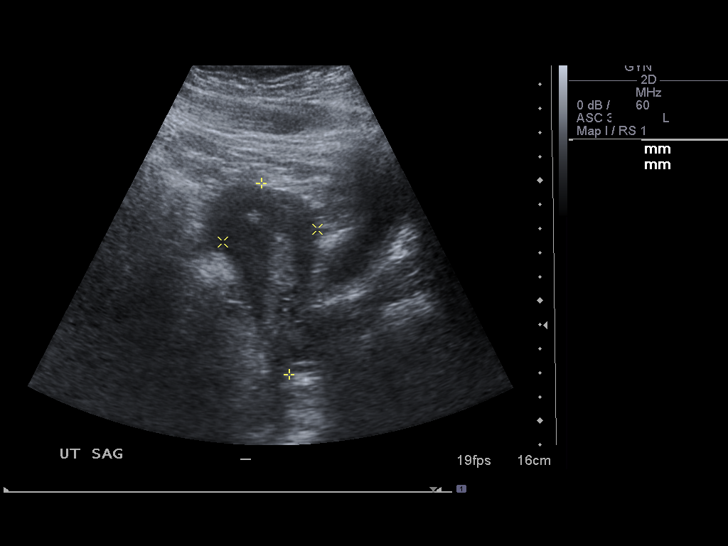
[im 5/51]
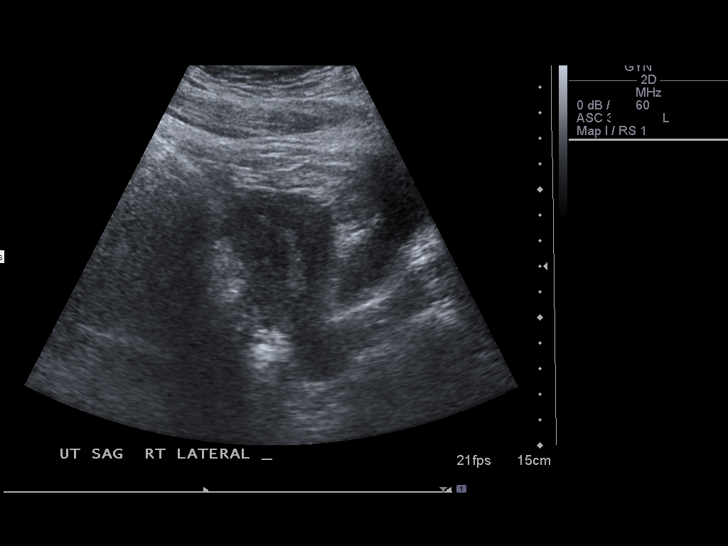
[im 9/51]
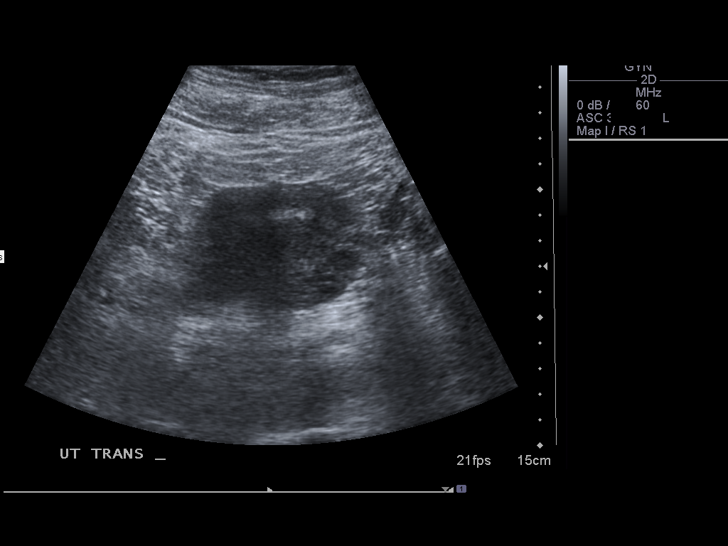
[im 13/51]
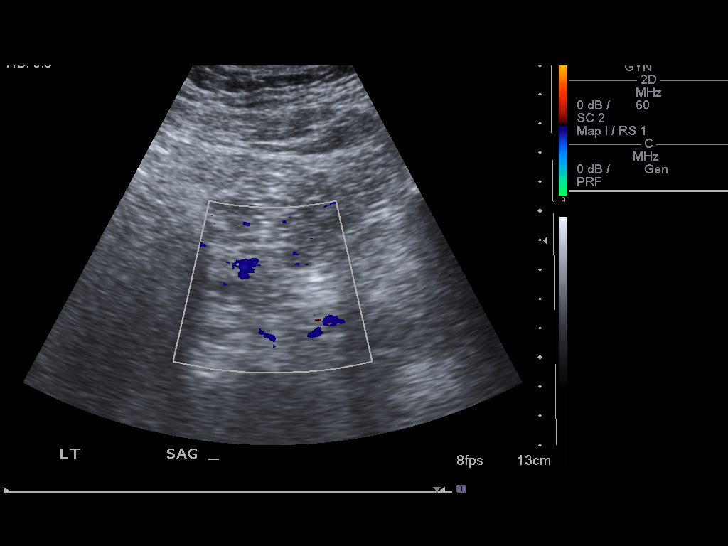
[im 17/51]
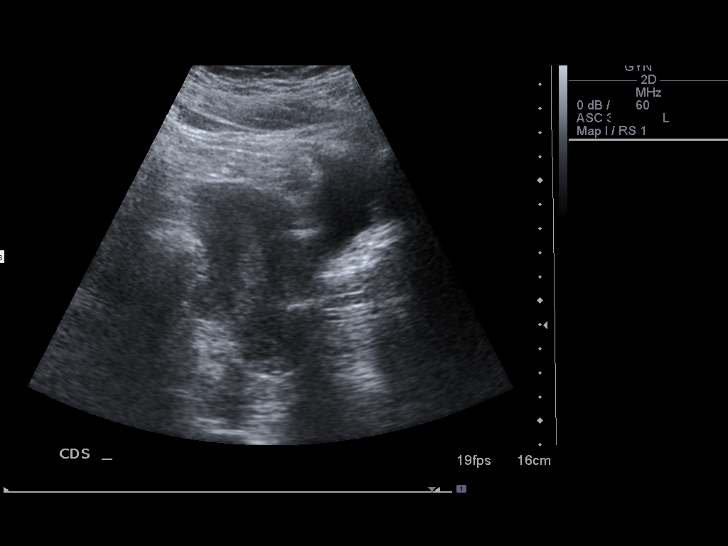
[im 19/51]
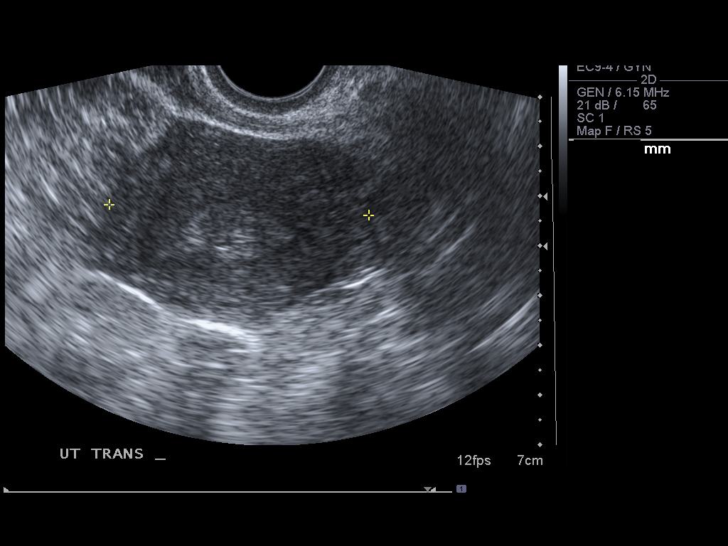
[im 23/51]
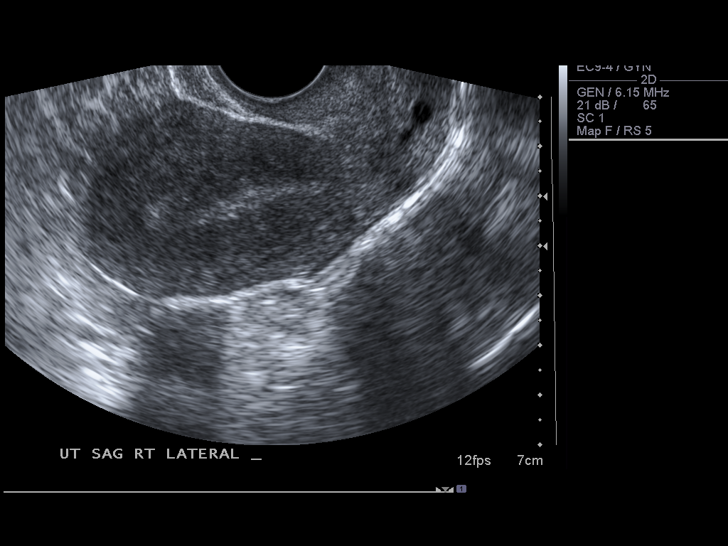
[im 28/51]
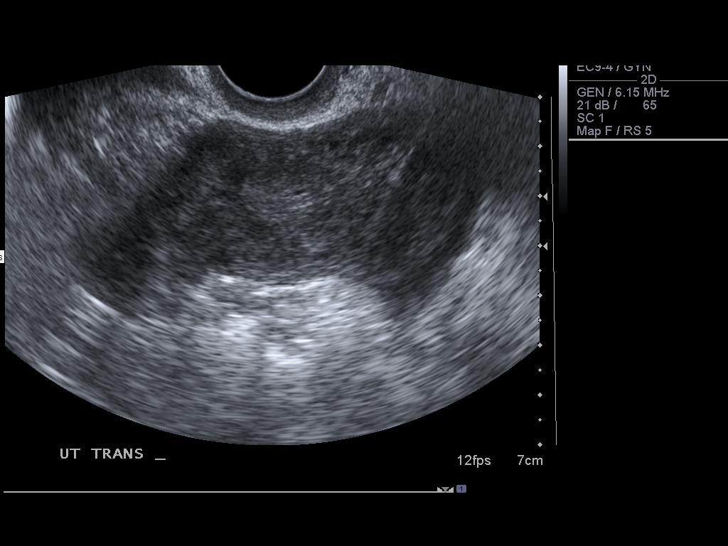
[im 32/51]
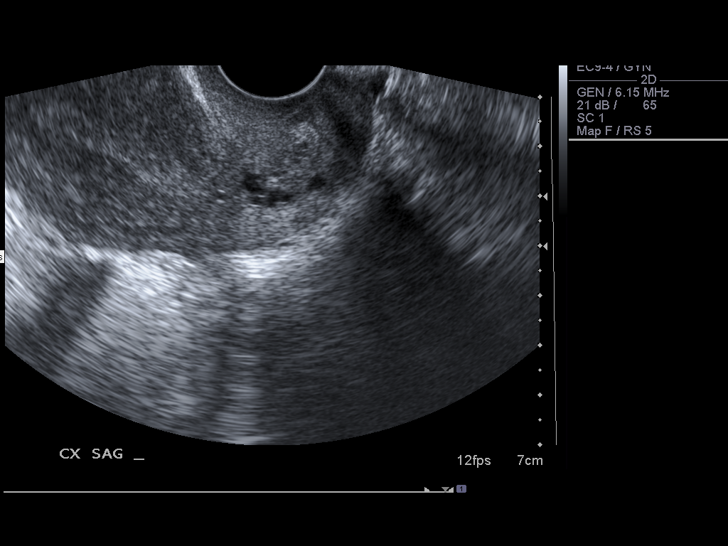
[im 34/51]
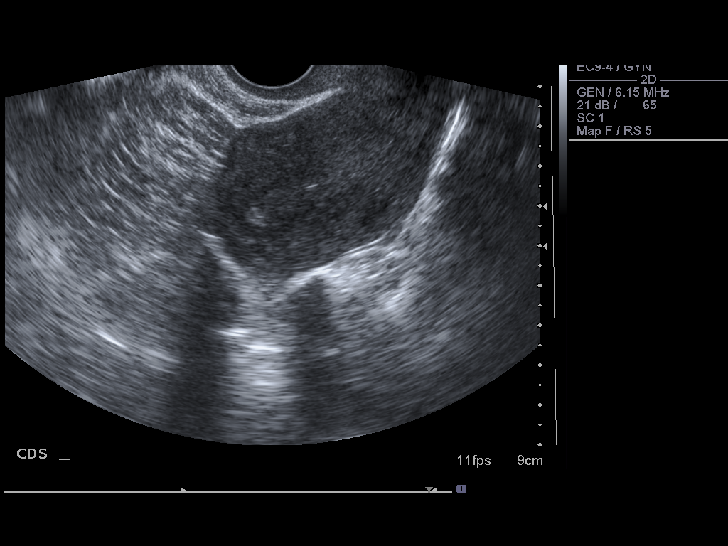
[im 38/51]
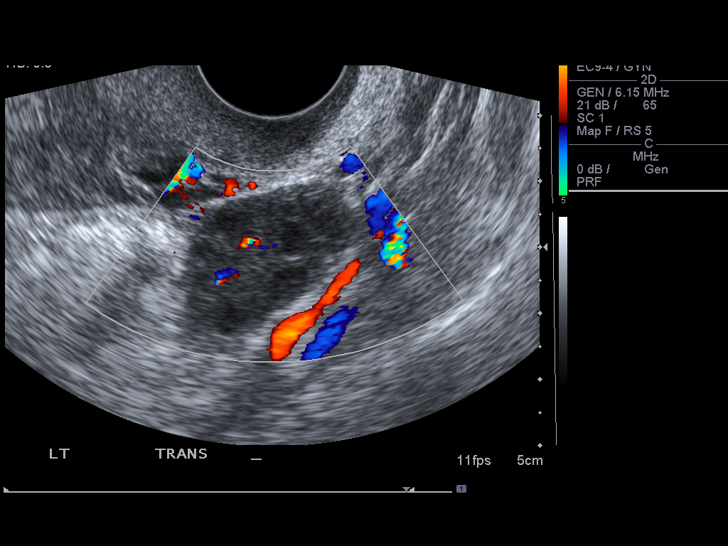
[im 42/51]
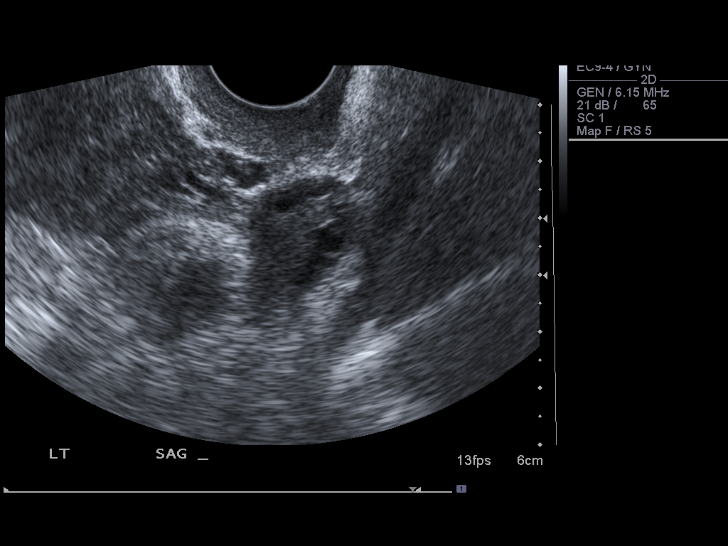
[im 46/51]
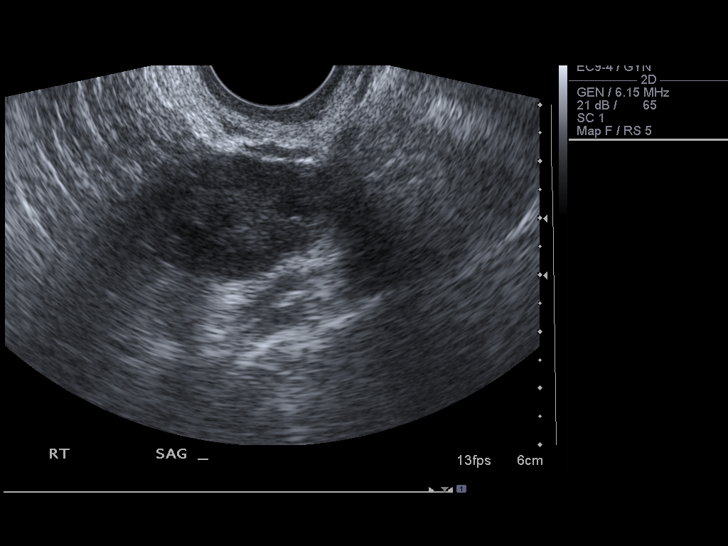
[im 51/51]
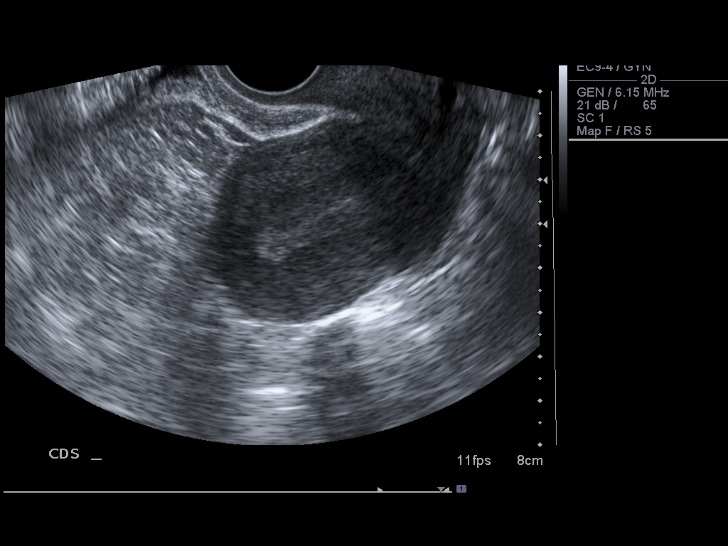

[14 of 25 positions shown; findings below may reference images not displayed]

FINDINGS: Uterus measures 8.0 x 4.0 x 5.4 cm and appears normal.

Endometrium endometrial stripe thickness is normal at 1.0 cm.

Right Ovary measures 3.0 x 2.5 x 2.8 cm and appears normal.

Left Ovary measures 3.0 x 2.2 x 2.4 cm and appears normal.

Other Findings:  Small amount of free pelvic fluid is noted.
IMPRESSION: No acute finding.  Small amount of free pelvic fluid is compatible
with physiologic change.

## 2011-05-25 ENCOUNTER — Encounter: Payer: Self-pay | Admitting: Endocrinology

## 2011-05-25 ENCOUNTER — Other Ambulatory Visit (INDEPENDENT_AMBULATORY_CARE_PROVIDER_SITE_OTHER): Payer: BC Managed Care – PPO

## 2011-05-25 ENCOUNTER — Ambulatory Visit (INDEPENDENT_AMBULATORY_CARE_PROVIDER_SITE_OTHER): Payer: BC Managed Care – PPO | Admitting: Endocrinology

## 2011-05-25 VITALS — BP 110/72 | HR 81 | Temp 97.6°F | Ht 62.0 in | Wt 198.0 lb

## 2011-05-25 DIAGNOSIS — E119 Type 2 diabetes mellitus without complications: Secondary | ICD-10-CM

## 2011-05-25 LAB — HEMOGLOBIN A1C: Hgb A1c MFr Bld: 5.1 % (ref 4.6–6.5)

## 2011-05-25 LAB — BASIC METABOLIC PANEL
Chloride: 104 mEq/L (ref 96–112)
Potassium: 4.1 mEq/L (ref 3.5–5.1)
Sodium: 140 mEq/L (ref 135–145)

## 2011-05-25 MED ORDER — HYDROCODONE-ACETAMINOPHEN 10-325 MG PO TABS: 1.0000 | ORAL_TABLET | Freq: Four times a day (QID) | ORAL | Status: AC | PRN

## 2011-05-25 MED ORDER — METHYLPREDNISOLONE (PAK) 4 MG PO TABS: ORAL_TABLET | ORAL | Status: AC

## 2011-05-25 NOTE — Patient Instructions (Addendum)
i have sent a prescription to your pharmacy, for a steroid "pack." I hope you feel better soon.  If you don't feel better by next week, please dr Felicity Coyer.  Also call if you develop a rash at the painful area.   blood tests are being requested for you today.  You will receive a letter with results. check your blood sugar 1 time a day.  vary the time of day when you check, between before the 3 meals, and at bedtime.  also check if you have symptoms of your blood sugar being too high or too low.  please keep a record of the readings and bring it to your next appointment here.  please call us sooner if your blood sugar goes below 70, or if it stays over 200. Here is a prescription for pain medication.

## 2011-05-25 NOTE — Progress Notes (Signed)
Subjective:    Patient ID: Jasmine Howard, female    DOB: 10-14-1975, 36 y.o.   MRN: [id number]  HPI 5 days of moderate pain at the left side of the head, with assoc radiation to the LUE.  She is unable to further localize it on the LUE.  No local injury.  LMP: now.  she has IUD.  She is not breast-feeding.   She does not check cbg's. Past Medical History  Diagnosis Date  . Abdominal pain, right lower quadrant 03/07/2009  . ANEMIA-NOS 09/26/2008  . BENIGN POSITIONAL VERTIGO 12/10/2009  . CHICKENPOX, HX OF 09/26/2008  . CERVICAL INCOMPETENCE 02/24/2008  . DEPRESSION 09/26/2008  . FATTY LIVER DISEASE 03/31/2009  . HEMATOCHEZIA 09/26/2008  . HYPOTHYROIDISM 12/10/2009  . Irritable bowel syndrome 03/31/2009  . IRITIS 03/07/2009  . DIABETES MELLITUS, TYPE II 12/10/2009    metformin  . Polycystic ovaries   . Infertility, female   . Cervical incompetence   . Postpartum care following cesarean delivery (breech 11/16) 12/12/2010    Past Surgical History  Procedure Date  . Cholecystectomy 2009  . Cesarean section 12/11/2010    Procedure: CESAREAN SECTION;  Surgeon: Tresa Endo A. Fogleman;  Location: [facility];  Service: Gynecology;  Laterality: N/A;    History   Social History  . Marital Status: Married    Spouse Name: N/A    Number of Children: N/A  . Years of Education: N/A   Occupational History  . Not on file.   Social History Main Topics  . Smoking status: Never Smoker   . Smokeless tobacco: Never Used   Comment: Married, lives with spouse and his son. 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 Alcohol Use: No  . Drug Use: No  . Sexually Active:    Other Topics Concern  . Not on file   Social History Narrative  . No narrative on file    Current Outpatient Prescriptions on File Prior to Visit  Medication Sig Dispense Refill  . levothyroxine (SYNTHROID, LEVOTHROID) 50 MCG tablet Take 50 mcg by mouth daily.        . metFORMIN (GLUCOPHAGE-XR) 750 MG 24 hr tablet Take 750 mg by  mouth 2 (two) times daily.        . Multiple Vitamin (MULTIVITAMIN) tablet Take 1 tablet by mouth daily.          No Known Allergies  Family History  Problem Relation Age of Onset  . Arthritis Other     Parent  . Alcohol abuse Other     Parent    BP 110/72  Pulse 81  Temp(Src) 97.6 F (36.4 C) (Oral)  Ht 5\' 2"  (1.575 m)  Wt 198 lb (89.812 kg)  BMI 36.21 kg/m2  SpO2 99%  LMP 05/16/2011  Review of Systems The pain has a numbness quality to it.  Denies fever.     Objective:   Physical Exam VITAL SIGNS:  See vs page GENERAL: no distress head: no deformity.  nontender eyes: no periorbital swelling, no proptosis external nose and ears are normal mouth: no lesion seen Both eac's and tm's are normal.   Neck: supple LUE: sensation and strength are normal.  Normal pulses and color.     Lab Results  Component Value Date   WBC 8.4 12/12/2010   HGB 10.3* 12/12/2010   HCT 30.0* 12/12/2010   PLT 155 12/12/2010   GLUCOSE 128* 05/25/2011   CHOL 154 04/11/2009   HDL 41 04/11/2009   LDLCALC 89  04/11/2009   ALT 11 08/25/2010   AST 12 08/25/2010   NA 140 05/25/2011   K 4.1 05/25/2011   CL 104 05/25/2011   CREATININE 0.6 05/25/2011   BUN 7 05/25/2011   CO2 26 05/25/2011   TSH 7.36 04/11/2009   HGBA1C 5.1 05/25/2011      Assessment & Plan:  Cervical radiculopathy, new DM.  well-controlled.  However, she is at risk for exacerbation with steroids. Contraception--the IUD is effective.

## 2011-05-26 ENCOUNTER — Telehealth: Payer: Self-pay | Admitting: *Deleted

## 2011-05-26 NOTE — Telephone Encounter (Signed)
Called pt to inform of lab results, left message for pt to callback office (letter also mailed to pt). 

## 2011-05-27 NOTE — Telephone Encounter (Signed)
Pt informed of lab results. 

## 2011-11-03 ENCOUNTER — Encounter (HOSPITAL_COMMUNITY): Payer: Self-pay | Admitting: *Deleted

## 2011-11-03 ENCOUNTER — Emergency Department (HOSPITAL_COMMUNITY)
Admission: EM | Admit: 2011-11-03 | Discharge: 2011-11-04 | Disposition: A | Discharge status: Home / Self Care | Payer: BC Managed Care – PPO | Attending: Emergency Medicine | Admitting: Emergency Medicine

## 2011-11-03 DIAGNOSIS — F329 Major depressive disorder, single episode, unspecified: Secondary | ICD-10-CM | POA: Insufficient documentation

## 2011-11-03 DIAGNOSIS — R319 Hematuria, unspecified: Secondary | ICD-10-CM | POA: Insufficient documentation

## 2011-11-03 DIAGNOSIS — Z8261 Family history of arthritis: Secondary | ICD-10-CM | POA: Insufficient documentation

## 2011-11-03 DIAGNOSIS — N390 Urinary tract infection, site not specified: Secondary | ICD-10-CM | POA: Insufficient documentation

## 2011-11-03 DIAGNOSIS — F3289 Other specified depressive episodes: Secondary | ICD-10-CM | POA: Insufficient documentation

## 2011-11-03 DIAGNOSIS — Z6379 Other stressful life events affecting family and household: Secondary | ICD-10-CM | POA: Insufficient documentation

## 2011-11-03 DIAGNOSIS — E119 Type 2 diabetes mellitus without complications: Secondary | ICD-10-CM | POA: Insufficient documentation

## 2011-11-03 LAB — URINE MICROSCOPIC-ADD ON

## 2011-11-03 LAB — URINALYSIS, ROUTINE W REFLEX MICROSCOPIC
Ketones, ur: NEGATIVE mg/dL
Nitrite: NEGATIVE
Specific Gravity, Urine: 1.004 — ABNORMAL LOW (ref 1.005–1.030)
pH: 8 (ref 5.0–8.0)

## 2011-11-03 MED ORDER — OXYCODONE-ACETAMINOPHEN 5-325 MG PO TABS
ORAL_TABLET | ORAL | Status: AC
  Filled 2011-11-03: qty 1

## 2011-11-03 MED ORDER — OXYCODONE-ACETAMINOPHEN 5-325 MG PO TABS
1.0000 | ORAL_TABLET | Freq: Once | ORAL | Status: AC
  Administered 2011-11-03: 1 via ORAL

## 2011-11-03 NOTE — ED Notes (Signed)
Pt c/o burning, frequency, urgency and blood in urine since 100 today. Pt also c/o lower back pain.

## 2011-11-04 MED ORDER — NAPROXEN 375 MG PO TABS: 375.0000 mg | ORAL_TABLET | Freq: Two times a day (BID) | ORAL | Status: DC

## 2011-11-04 MED ORDER — HYDROCODONE-ACETAMINOPHEN 5-325 MG PO TABS: 1.0000 | ORAL_TABLET | Freq: Four times a day (QID) | ORAL | Status: DC | PRN

## 2011-11-04 MED ORDER — SULFAMETHOXAZOLE-TRIMETHOPRIM 800-160 MG PO TABS: 1.0000 | ORAL_TABLET | Freq: Two times a day (BID) | ORAL | Status: DC

## 2011-11-04 MED ORDER — HYDROCODONE-ACETAMINOPHEN 5-325 MG PO TABS
1.0000 | ORAL_TABLET | Freq: Once | ORAL | Status: AC
  Administered 2011-11-04: 1 via ORAL
  Filled 2011-11-04: qty 1

## 2011-11-04 MED ORDER — IBUPROFEN 800 MG PO TABS
800.0000 mg | ORAL_TABLET | Freq: Once | ORAL | Status: AC
  Administered 2011-11-04: 800 mg via ORAL
  Filled 2011-11-04: qty 1

## 2011-11-04 MED ORDER — SULFAMETHOXAZOLE-TMP DS 800-160 MG PO TABS
1.0000 | ORAL_TABLET | Freq: Once | ORAL | Status: AC
  Administered 2011-11-04: 1 via ORAL
  Filled 2011-11-04: qty 1

## 2011-11-04 NOTE — ED Provider Notes (Signed)
History     CSN: [id number]  Arrival date & time 11/03/11  2044   First MD Initiated Contact with Patient 11/03/11 2359      Chief Complaint  Patient presents with  . Dysuria  . Hematuria    (Consider location/radiation/quality/duration/timing/severity/associated sxs/prior treatment) Patient is a 36 y.o. female presenting with dysuria. The history is provided by the patient.  Dysuria  This is a new problem. The current episode started 6 to 12 hours ago. The problem occurs every urination. The problem has been gradually worsening. The quality of the pain is described as burning and aching. The pain is at a severity of 6/10. The pain is moderate. There has been no fever. Associated symptoms include frequency. Pertinent negatives include no sweats, no discharge, no possible pregnancy and no urgency. She has tried nothing for the symptoms. Her past medical history does not include single kidney, urological procedure, recurrent UTIs, urinary stasis or catheterization.    Past Medical History  Diagnosis Date  . Abdominal pain, right lower quadrant 03/07/2009  . ANEMIA-NOS 09/26/2008  . BENIGN POSITIONAL VERTIGO 12/10/2009  . CHICKENPOX, HX OF 09/26/2008  . CERVICAL INCOMPETENCE 02/24/2008  . DEPRESSION 09/26/2008  . FATTY LIVER DISEASE 03/31/2009  . HEMATOCHEZIA 09/26/2008  . HYPOTHYROIDISM 12/10/2009  . Irritable bowel syndrome 03/31/2009  . IRITIS 03/07/2009  . DIABETES MELLITUS, TYPE II 12/10/2009    metformin  . Polycystic ovaries   . Infertility, female   . Cervical incompetence   . Postpartum care following cesarean delivery (breech 11/16) 12/12/2010    Past Surgical History  Procedure Date  . Cholecystectomy 2009  . Cesarean section 12/11/2010    Procedure: CESAREAN SECTION;  Surgeon: Tresa Endo A. Fogleman;  Location: [facility];  Service: Gynecology;  Laterality: N/A;    Family History  Problem Relation Age of Onset  . Arthritis Other     Parent  . Alcohol abuse Other     Parent     History  Substance Use Topics  . Smoking status: Never Smoker   . Smokeless tobacco: Never Used   Comment: Married, lives with spouse and his son. 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 Alcohol Use: No    OB History    Grav Para Term Preterm Abortions TAB SAB Ect Mult Living   2 2 1 1  0 0 0 0 0 1      Review of Systems  Constitutional: Negative for fever, diaphoresis and activity change.  HENT: Negative for congestion and neck pain.   Respiratory: Negative for cough.   Genitourinary: Positive for dysuria and frequency. Negative for urgency, vaginal discharge, vaginal pain and dyspareunia.  Musculoskeletal: Negative for myalgias.  Skin: Negative for color change and wound.  Neurological: Negative for headaches.  All other systems reviewed and are negative.    Allergies  Review of patient's allergies indicates no known allergies.  Home Medications   Current Outpatient Rx  Name Route Sig Dispense Refill  . LEVOTHYROXINE SODIUM 50 MCG PO TABS Oral Take 50 mcg by mouth daily.      Marland Kitchen METFORMIN HCL ER 750 MG PO TB24 Oral Take 750 mg by mouth 2 (two) times daily.        BP 129/70  Pulse 108  Resp 24  SpO2 100%  LMP 10/29/2011  Physical Exam  Nursing note and vitals reviewed. Constitutional: She is oriented to person, place, and time. She appears well-developed and well-nourished. No distress.  HENT:  Head: Normocephalic and atraumatic.  Eyes: Conjunctivae normal and EOM are normal.  Neck: Normal range of motion.  Pulmonary/Chest: Effort normal.  Abdominal:       Soft, mild ttp over suprapubic region  Genitourinary:       No flank pain  Musculoskeletal: Normal range of motion.  Neurological: She is alert and oriented to person, place, and time.  Skin: Skin is warm and dry. No rash noted. She is not diaphoretic.  Psychiatric: She has a normal mood and affect. Her behavior is normal.    ED Course  Procedures (including critical care time)  Labs  Reviewed  URINALYSIS, ROUTINE W REFLEX MICROSCOPIC - Abnormal; Notable for the following:    Specific Gravity, Urine 1.004 (*)     Hgb urine dipstick MODERATE (*)     Leukocytes, UA LARGE (*)     All other components within normal limits  PREGNANCY, URINE  URINE MICROSCOPIC-ADD ON   No results found.   No diagnosis found.    MDM  UTI  Pt has been diagnosed with a UTI. Pt is afebrile, no CVA tenderness, normotensive, and denies N/V. Pt to be dc home with antibiotics and instructions to follow up with PCP if symptoms persist.         Jaci Carrel, PA-C 11/04/11 0040

## 2011-11-05 NOTE — ED Provider Notes (Signed)
Medical screening examination/treatment/procedure(s) were performed by non-physician practitioner and as supervising physician I was immediately available for consultation/collaboration.  Cathleen Yagi, MD 11/05/11 1120 

## 2011-12-30 ENCOUNTER — Ambulatory Visit (INDEPENDENT_AMBULATORY_CARE_PROVIDER_SITE_OTHER): Payer: BC Managed Care – PPO | Admitting: *Deleted

## 2011-12-30 DIAGNOSIS — Z23 Encounter for immunization: Secondary | ICD-10-CM

## 2012-01-25 LAB — OB RESULTS CONSOLE RPR: RPR: NONREACTIVE

## 2012-01-25 LAB — OB RESULTS CONSOLE HIV ANTIBODY (ROUTINE TESTING): HIV: NONREACTIVE

## 2012-02-02 ENCOUNTER — Encounter: Payer: Self-pay | Admitting: Internal Medicine

## 2012-02-02 ENCOUNTER — Ambulatory Visit (INDEPENDENT_AMBULATORY_CARE_PROVIDER_SITE_OTHER): Payer: BC Managed Care – PPO | Admitting: Internal Medicine

## 2012-02-02 VITALS — BP 128/68 | HR 90 | Temp 99.8°F | Ht 62.0 in | Wt 183.0 lb

## 2012-02-02 DIAGNOSIS — J019 Acute sinusitis, unspecified: Secondary | ICD-10-CM

## 2012-02-02 MED ORDER — AMOXICILLIN-POT CLAVULANATE 875-125 MG PO TABS: 1.0000 | ORAL_TABLET | Freq: Two times a day (BID) | ORAL | Status: DC

## 2012-02-02 NOTE — Patient Instructions (Signed)

## 2012-02-02 NOTE — Progress Notes (Signed)
HPI  Pt presents to the clinic today with c/o left ear pain, headache and sore throat x 1 week. She has taken Tylenol without relief. She is pregnant so she is not really sure what she can take. She has been running fevers. She denies nausea, vomiting or diarrhea. The symptoms seem to be worse every day. She has had sick contacts.  Review of Systems    Past Medical History  Diagnosis Date  . Abdominal pain, right lower quadrant 03/07/2009  . ANEMIA-NOS 09/26/2008  . BENIGN POSITIONAL VERTIGO 12/10/2009  . CHICKENPOX, HX OF 09/26/2008  . CERVICAL INCOMPETENCE 02/24/2008  . DEPRESSION 09/26/2008  . FATTY LIVER DISEASE 03/31/2009  . HEMATOCHEZIA 09/26/2008  . HYPOTHYROIDISM 12/10/2009  . Irritable bowel syndrome 03/31/2009  . IRITIS 03/07/2009  . DIABETES MELLITUS, TYPE II 12/10/2009    metformin  . Polycystic ovaries   . Infertility, female   . Cervical incompetence   . Postpartum care following cesarean delivery (breech 11/16) 12/12/2010    Family History  Problem Relation Age of Onset  . Arthritis Other     Parent  . Alcohol abuse Other     Parent    History   Social History  . Marital Status: Married    Spouse Name: N/A    Number of Children: N/A  . Years of Education: N/A   Occupational History  . Not on file.   Social History Main Topics  . Smoking status: Never Smoker   . Smokeless tobacco: Never Used     Comment: Married, lives with spouse and his son. 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 Alcohol Use: No  . Drug Use: No  . Sexually Active:    Other Topics Concern  . Not on file   Social History Narrative  . No narrative on file    No Known Allergies   Constitutional: Positive headache, fatigue and fever. Denies abrupt weight changes.  HEENT:  Positive eye pain, pressure behind the eyes, facial pain, nasal congestion and sore throat. Denies eye redness, ear pain, ringing in the ears, wax buildup, runny nose or bloody nose. Respiratory:  Denies  cough, difficulty breathing or shortness of breath.  Cardiovascular: Denies chest pain, chest tightness, palpitations or swelling in the hands or feet.   No other specific complaints in a complete review of systems (except as listed in HPI above).  Objective:    BP 128/68  Pulse 90  Temp 99.8 F (37.7 C) (Oral)  Ht 5\' 2"  (1.575 m)  Wt 183 lb (83.008 kg)  BMI 33.47 kg/m2  SpO2 99%  LMP 10/29/2011 Wt Readings from Last 3 Encounters:  02/02/12 183 lb (83.008 kg)  05/25/11 198 lb (89.812 kg)  12/11/10 215 lb (97.523 kg)    General: Appears her stated age, well developed, well nourished in NAD. HEENT: Head: normal shape and size, sinuses tender to palpation; Eyes: sclera white, no icterus, conjunctiva pink, PERRLA and EOMs intact; Ears: Tm's gray and intact, normal light reflex; Nose: mucosa pink and moist, septum midline; Throat/Mouth: + PND. Teeth present, mucosa pink and moist, no exudate noted, no lesions or ulcerations noted.  Neck: Mild cervical lymphadenopathy. Neck supple, trachea midline. No massses, lumps or thyromegaly present.  Cardiovascular: Normal rate and rhythm. S1,S2 noted.  No murmur, rubs or gallops noted. No JVD or BLE edema. No carotid bruits noted. Pulmonary/Chest: Normal effort and positive vesicular breath sounds. No respiratory distress. No wheezes, rales or ronchi noted.  Assessment & Plan:   Acute bacterial sinusitis  Can use a Neti Pot which can be purchased from your local drug store. Flonase 2 sprays each nostril for 3 days and then as needed. Augmentin BID for 10 days  RTC as needed or if symptoms persist.

## 2012-02-21 ENCOUNTER — Other Ambulatory Visit: Payer: Self-pay | Admitting: Obstetrics

## 2012-02-26 LAB — HM PAP SMEAR: HM Pap smear: NORMAL

## 2012-03-01 ENCOUNTER — Encounter (HOSPITAL_COMMUNITY)
Admission: RE | Admit: 2012-03-01 | Discharge: 2012-03-01 | Disposition: A | Discharge status: Home / Self Care | Payer: BC Managed Care – PPO | Source: Ambulatory Visit | Attending: Obstetrics | Admitting: Obstetrics

## 2012-03-01 ENCOUNTER — Encounter (HOSPITAL_COMMUNITY): Payer: Self-pay

## 2012-03-01 LAB — BASIC METABOLIC PANEL
Calcium: 9.2 mg/dL (ref 8.4–10.5)
GFR calc Af Amer: >90 mL/min (ref >90)
GFR calc non Af Amer: >90 mL/min (ref >90)
Potassium: 3.5 mEq/L (ref 3.5–5.1)
Sodium: 133 mEq/L — ABNORMAL LOW (ref 135–145)

## 2012-03-01 LAB — TYPE AND SCREEN
ABO/RH(D): B POS
Antibody Screen: NEGATIVE

## 2012-03-01 LAB — CBC
Hemoglobin: 11.8 g/dL — ABNORMAL LOW (ref 12.0–15.0)
RBC: 4.32 MIL/uL (ref 3.87–5.11)
WBC: 8.8 10*3/uL (ref 4.0–10.5)

## 2012-03-01 NOTE — Patient Instructions (Signed)
Your procedure is scheduled on:03/03/12  Enter through the Main Entrance at : 6am Pick up desk phone and dial [phone] and inform us of your arrival.  Please call [phone] if you have any problems the morning of surgery.  Remember: Do not eat or drink after midnight:Thursday   Take these meds the morning of surgery with a sip of water:thyroid med HOLD Metformin for 24 hours prior to surgery  DO NOT wear jewelry, eye make-up, lipstick,body lotion, or dark fingernail polish. Do not shave for 48 hours prior to surgery.  If you are to be admitted after surgery, leave suitcase in car until your room has been assigned. Patients discharged on the day of surgery will not be allowed to drive home.

## 2012-03-03 ENCOUNTER — Encounter (HOSPITAL_COMMUNITY)
Admission: RE | Disposition: A | Discharge status: Home / Self Care | Payer: Self-pay | Source: Ambulatory Visit | Attending: Obstetrics

## 2012-03-03 ENCOUNTER — Ambulatory Visit (HOSPITAL_COMMUNITY)
Admission: RE | Admit: 2012-03-03 | Discharge: 2012-03-03 | Disposition: A | Discharge status: Home / Self Care | Payer: BC Managed Care – PPO | Source: Ambulatory Visit | Attending: Obstetrics | Admitting: Obstetrics

## 2012-03-03 ENCOUNTER — Ambulatory Visit (HOSPITAL_COMMUNITY): Payer: BC Managed Care – PPO | Admitting: Anesthesiology

## 2012-03-03 ENCOUNTER — Encounter (HOSPITAL_COMMUNITY): Payer: Self-pay | Admitting: Pharmacy Technician

## 2012-03-03 ENCOUNTER — Encounter (HOSPITAL_COMMUNITY): Payer: Self-pay | Admitting: Anesthesiology

## 2012-03-03 ENCOUNTER — Encounter (HOSPITAL_COMMUNITY): Payer: Self-pay | Admitting: General Practice

## 2012-03-03 DIAGNOSIS — O3430 Maternal care for cervical incompetence, unspecified trimester: Secondary | ICD-10-CM | POA: Insufficient documentation

## 2012-03-03 HISTORY — PX: CERVICAL CERCLAGE: SHX1329

## 2012-03-03 LAB — GLUCOSE, CAPILLARY: Glucose-Capillary: 74 mg/dL (ref 70–99)

## 2012-03-03 SURGERY — CERCLAGE, CERVIX, VAGINAL APPROACH
Anesthesia: Spinal | Site: Cervix | Wound class: Clean Contaminated

## 2012-03-03 MED ORDER — MIDAZOLAM HCL 2 MG/2ML IJ SOLN: 0.5000 mg | Freq: Once | INTRAMUSCULAR | Status: DC | PRN

## 2012-03-03 MED ORDER — CEFAZOLIN SODIUM-DEXTROSE 2-3 GM-% IV SOLR
INTRAVENOUS | Status: AC
  Administered 2012-03-03: 2 g via INTRAVENOUS
  Filled 2012-03-03: qty 50

## 2012-03-03 MED ORDER — MEPERIDINE HCL 25 MG/ML IJ SOLN: 6.2500 mg | INTRAMUSCULAR | Status: DC | PRN

## 2012-03-03 MED ORDER — PHENYLEPHRINE HCL 10 MG/ML IJ SOLN
INTRAMUSCULAR | Status: DC | PRN
  Administered 2012-03-03 (×3): 40 ug via INTRAVENOUS

## 2012-03-03 MED ORDER — KETOROLAC TROMETHAMINE 30 MG/ML IJ SOLN: 15.0000 mg | Freq: Once | INTRAMUSCULAR | Status: DC | PRN

## 2012-03-03 MED ORDER — LIDOCAINE IN DEXTROSE 5-7.5 % IV SOLN
INTRAVENOUS | Status: DC | PRN
  Administered 2012-03-03: 1 mL via INTRATHECAL

## 2012-03-03 MED ORDER — LACTATED RINGERS IV SOLN
INTRAVENOUS | Status: DC | PRN
  Administered 2012-03-03: 08:00:00 via INTRAVENOUS

## 2012-03-03 MED ORDER — LACTATED RINGERS IV SOLN
INTRAVENOUS | Status: DC
  Administered 2012-03-03: 07:00:00 via INTRAVENOUS
  Administered 2012-03-03: 125 mL/h via INTRAVENOUS

## 2012-03-03 MED ORDER — PHENYLEPHRINE 40 MCG/ML (10ML) SYRINGE FOR IV PUSH (FOR BLOOD PRESSURE SUPPORT)
PREFILLED_SYRINGE | INTRAVENOUS | Status: AC
  Filled 2012-03-03: qty 5

## 2012-03-03 MED ORDER — FENTANYL CITRATE 0.05 MG/ML IJ SOLN: 25.0000 ug | INTRAMUSCULAR | Status: DC | PRN

## 2012-03-03 MED ORDER — LIDOCAINE IN DEXTROSE 5-7.5 % IV SOLN
INTRAVENOUS | Status: AC
  Filled 2012-03-03: qty 2

## 2012-03-03 MED ORDER — PROMETHAZINE HCL 25 MG/ML IJ SOLN: 6.2500 mg | INTRAMUSCULAR | Status: DC | PRN

## 2012-03-03 SURGICAL SUPPLY — 17 items
CATH ROBINSON RED A/P 16FR (CATHETERS) ×1 IMPLANT
CLOTH BEACON ORANGE TIMEOUT ST (SAFETY) ×2 IMPLANT
COUNTER NEEDLE 1200 MAGNETIC (NEEDLE) ×1 IMPLANT
GLOVE BIOGEL M 6.5 STRL (GLOVE) ×4 IMPLANT
GLOVE BIOGEL PI IND STRL 7.0 (GLOVE) ×1 IMPLANT
GLOVE BIOGEL PI INDICATOR 7.0 (GLOVE) ×1
GOWN STRL REIN XL XLG (GOWN DISPOSABLE) ×4 IMPLANT
NEEDLE MAYO .5 CIRCLE (NEEDLE) ×1 IMPLANT
PACK VAGINAL MINOR WOMEN LF (CUSTOM PROCEDURE TRAY) ×2 IMPLANT
PAD OB MATERNITY 4.3X12.25 (PERSONAL CARE ITEMS) ×2 IMPLANT
PAD PREP 24X48 CUFFED NSTRL (MISCELLANEOUS) ×2 IMPLANT
SUT ETHIBOND  5 (SUTURE) ×1
SUT ETHIBOND 5 (SUTURE) IMPLANT
SUT PROLENE 0 CT 1 30 (SUTURE) ×2 IMPLANT
TOWEL OR 17X24 6PK STRL BLUE (TOWEL DISPOSABLE) ×4 IMPLANT
TUBING NON-CON 1/4 X 20 CONN (TUBING) ×1 IMPLANT
WATER STERILE IRR 1000ML POUR (IV SOLUTION) ×2 IMPLANT

## 2012-03-03 NOTE — Op Note (Signed)
03/03/2012  8:29 AM  PATIENT:  Jasmine Howard  37 y.o. female  PRE-OPERATIVE DIAGNOSIS:  Cervical Incompetence   POST-OPERATIVE DIAGNOSIS:  Cervical Incompetence   PROCEDURE:  Procedure(s) (LRB) with comments: CERCLAGE CERVICAL (N/A) - EDD:  09/07/12  SURGEON:  Surgeon(s) and Role:    Tresa Endo A. Ernestina Penna, MD - Primary  PHYSICIAN ASSISTANT:   ASSISTANTS: none   ANESTHESIA:   epidural  EBL:  Total I/O In: 800 [I.V.:800] Out: 310 [Urine:300; Blood:10]  BLOOD ADMINISTERED:none  DRAINS: none   LOCAL MEDICATIONS USED:  NONE  SPECIMEN:  No Specimen  DISPOSITION OF SPECIMEN:  N/A  COUNTS:  YES  TOURNIQUET:  * No tourniquets in log *  DICTATION: .Note written in EPIC  PLAN OF CARE: Discharge to home after PACU  PATIENT DISPOSITION:  PACU - hemodynamically stable.   Delay start of Pharmacological VTE agent (>24hrs) due to surgical blood loss or risk of bleeding: yes  Indication: 37 yo G2P1101 at 13 wks with h/o 22 wk loss due to cervical incompetence, successful 2nd pregnancy with cerclage who presents for repeat cerclage. Pt understands R/B.  Complications: none Findings: 3 cm long, closed cervix, hemostasis postprocedure Abx: 2g Ancef  Procedure: After informed consent was obtained for the procedure, pt was taken the OR where epidural anaesthesia was initiated without difficulty. Betadine prep and sterile drapes placed. Bladder drained for 300cc clear urine. Speculum and bimanual exam performed. Weighted speculum placed in vagina and pt placed in slight Trendelenburg position. Using Ring forceps to straighten and lengthen the cervix, a #5 ethibond suture on a 1/2 inch curved needle was carefully placed, in a purse-string fashion, around the cervix, about 2 cm from the external os. 4 passes of the needle was done, depth midway through the cervical stroma but not through to the internal os. A prolene tag was placed under the ethibond suture, 6 knots were placed in the  ethibond at 1 o'clock. The prolene was tied, 16 knots, then a space and another 4 knots to allow localization of the cerclage. Pt tolerated the procedure well.   Saylah Ketner A. 03/03/2012 8:37 AM

## 2012-03-03 NOTE — H&P (Signed)
History reviewed w/ pt. No changes. See scanned H&P. History indicated prophylactic cerclage.  Jasmine Howard A. 03/03/2012 7:26 AM

## 2012-03-03 NOTE — Anesthesia Postprocedure Evaluation (Signed)
Anesthesia Post Note  Patient: Jasmine Howard  Procedure(s) Performed: Procedure(s) (LRB): CERCLAGE CERVICAL (N/A)  Anesthesia type: Spinal  Patient location: PACU  Post pain: Pain level controlled  Post assessment: Post-op Vital signs reviewed  Last Vitals:  Filed Vitals:   03/03/12 0930  BP: 109/63  Pulse: 66  Temp: 36.8 C  Resp: 16    Post vital signs: Reviewed  Level of consciousness: awake  Complications: No apparent anesthesia complications

## 2012-03-03 NOTE — Anesthesia Preprocedure Evaluation (Signed)
Anesthesia Evaluation  Patient identified by MRN, date of birth, ID band Patient awake    Reviewed: Allergy & Precautions, H&P , NPO status , Patient's Chart, lab work & pertinent test results  Airway Mallampati: II      Dental No notable dental hx.    Pulmonary neg pulmonary ROS,  breath sounds clear to auscultation  Pulmonary exam normal       Cardiovascular Exercise Tolerance: Good negative cardio ROS  Rhythm:regular Rate:Normal     Neuro/Psych PSYCHIATRIC DISORDERS negative neurological ROS  negative psych ROS   GI/Hepatic negative GI ROS, Neg liver ROS,   Endo/Other  negative endocrine ROSdiabetesHypothyroidism   Renal/GU negative Renal ROS  negative genitourinary   Musculoskeletal   Abdominal Normal abdominal exam  (+)   Peds  Hematology negative hematology ROS (+) anemia ,   Anesthesia Other Findings Abdominal pain, right lower quadrant 03/07/2009   BENIGN POSITIONAL VERTIGO 12/10/2009      CHICKENPOX, HX OF 09/26/2008   CERVICAL INCOMPETENCE 02/24/2008      DEPRESSION 09/26/2008   FATTY LIVER DISEASE 03/31/2009      HEMATOCHEZIA 09/26/2008   HYPOTHYROIDISM 12/10/2009      Irritable bowel syndrome 03/31/2009   IRITIS 03/07/2009      Polycystic ovaries     Infertility, female        Cervical incompetence     Postpartum care following cesarean delivery (breech 11/16) 12/12/2010      DIABETES MELLITUS, TYPE II 12/10/2009 metformin ANEMIA-NOS 09/26/2008    Reproductive/Obstetrics (+) Pregnancy                           Anesthesia Physical Anesthesia Plan  ASA: III  Anesthesia Plan: Spinal   Post-op Pain Management:    Induction:   Airway Management Planned:   Additional Equipment:   Intra-op Plan:   Post-operative Plan:   Informed Consent: I have reviewed the patients History and Physical, chart, labs and discussed the procedure including the risks, benefits and alternatives for the  proposed anesthesia with the patient or authorized representative who has indicated his/her understanding and acceptance.     Plan Discussed with: Anesthesiologist, CRNA and Surgeon  Anesthesia Plan Comments:         Anesthesia Quick Evaluation

## 2012-03-03 NOTE — Transfer of Care (Signed)
Immediate Anesthesia Transfer of Care Note  Patient: Jasmine Howard  Procedure(s) Performed: Procedure(s) (LRB) with comments: CERCLAGE CERVICAL (N/A) - EDD:  09/07/12  Patient Location: PACU  Anesthesia Type:Spinal  Level of Consciousness: awake, alert  and oriented  Airway & Oxygen Therapy: Patient Spontanous Breathing  Post-op Assessment: Report given to PACU RN  Post vital signs: Reviewed and stable  Complications: No apparent anesthesia complications

## 2012-03-03 NOTE — Brief Op Note (Signed)
03/03/2012  8:29 AM  PATIENT:  Lorra Hals  37 y.o. female  PRE-OPERATIVE DIAGNOSIS:  Cervical Incompetence   POST-OPERATIVE DIAGNOSIS:  Cervical Incompetence   PROCEDURE:  Procedure(s) (LRB) with comments: CERCLAGE CERVICAL (N/A) - EDD:  09/07/12  SURGEON:  Surgeon(s) and Role:    Tresa Endo A. Ernestina Penna, MD - Primary  PHYSICIAN ASSISTANT:   ASSISTANTS: none   ANESTHESIA:   epidural  EBL:  Total I/O In: 800 [I.V.:800] Out: 310 [Urine:300; Blood:10]  BLOOD ADMINISTERED:none  DRAINS: none   LOCAL MEDICATIONS USED:  NONE  SPECIMEN:  No Specimen  DISPOSITION OF SPECIMEN:  N/A  COUNTS:  YES  TOURNIQUET:  * No tourniquets in log *  DICTATION: .Note written in EPIC  PLAN OF CARE: Discharge to home after PACU  PATIENT DISPOSITION:  PACU - hemodynamically stable.   Delay start of Pharmacological VTE agent (>24hrs) due to surgical blood loss or risk of bleeding: yes

## 2012-03-03 NOTE — Anesthesia Procedure Notes (Signed)
Spinal  Patient location during procedure: OR Start time: 03/03/2012 7:47 AM Staffing Performed by: anesthesiologist  Preanesthetic Checklist Completed: patient identified, site marked, surgical consent, pre-op evaluation, timeout performed, IV checked, risks and benefits discussed and monitors and equipment checked Spinal Block Patient position: sitting Prep: site prepped and draped and DuraPrep Patient monitoring: heart rate, continuous pulse ox and blood pressure Approach: midline Location: L3-4 Injection technique: single-shot Needle Needle type: Sprotte  Needle gauge: 24 G Needle length: 9 cm Assessment Sensory level: T10 Additional Notes Clear free flow CSF on first attempt.  No paresthesia.  Positioned sitting x 2 minutes after giving dose to allow for saddle block.  Patient tolerated procedure well.  Jasmine December, MD

## 2012-03-03 NOTE — Progress Notes (Signed)
Up to bathroom. Unable to void. Dr. Ernestina Penna consulted. orders written to insert foley catheter. Attach foley to leg bag. Patient to be discharged home with foley in place. Patient to return to office on Monday to have catheter removed. Instructions given to patient, process to empty leg bag and foley care with understanding with Teach back.

## 2012-03-06 ENCOUNTER — Encounter (HOSPITAL_COMMUNITY): Payer: Self-pay | Admitting: Obstetrics

## 2012-03-15 ENCOUNTER — Inpatient Hospital Stay (HOSPITAL_COMMUNITY)
Admission: AD | Admit: 2012-03-15 | Discharge: 2012-03-15 | Disposition: A | Discharge status: Home / Self Care | Payer: BC Managed Care – PPO | Source: Ambulatory Visit | Attending: Obstetrics and Gynecology | Admitting: Obstetrics and Gynecology

## 2012-03-15 ENCOUNTER — Encounter (HOSPITAL_COMMUNITY): Payer: Self-pay

## 2012-03-15 DIAGNOSIS — R109 Unspecified abdominal pain: Secondary | ICD-10-CM | POA: Insufficient documentation

## 2012-03-15 DIAGNOSIS — O99891 Other specified diseases and conditions complicating pregnancy: Secondary | ICD-10-CM | POA: Insufficient documentation

## 2012-03-15 DIAGNOSIS — O3430 Maternal care for cervical incompetence, unspecified trimester: Secondary | ICD-10-CM | POA: Insufficient documentation

## 2012-03-15 LAB — URINE MICROSCOPIC-ADD ON

## 2012-03-15 LAB — URINALYSIS, ROUTINE W REFLEX MICROSCOPIC
Bilirubin Urine: NEGATIVE
Hgb urine dipstick: NEGATIVE
Nitrite: NEGATIVE

## 2012-03-15 NOTE — MAU Note (Signed)
Patient is in with c/o intermittent sharp abdominal cramping that radiates from right upper quadrant to lower abdominal. She denies vaginal bleeding or abnormal discharge. Patient states that dr Ernestina Penna placed a cerclage 2 weeks ago.

## 2012-03-15 NOTE — Progress Notes (Signed)
Dr Billy Coast returned call, order to culture urine and discharge instruction.

## 2012-03-15 NOTE — H&P (Signed)
Jasmine Howard, Jasmine Howard               ACCOUNT NO.:  [id number]  MEDICAL RECORD NO.:  [id number]  LOCATION:  [id number]                          FACILITY:  [facility]  PHYSICIAN:  Lenoard Aden, M.D.DATE OF BIRTH:  07-15-1975  DATE OF ADMISSION:  03/15/2012 DATE OF DISCHARGE:  03/15/2012                             HISTORY & PHYSICAL   CHIEF COMPLAINT:  Cramping.  HISTORY:  She is a 37 year old, Hispanic female, G3, P1, history of cervical incontinence status post elective cerclage placement 2 weeks ago, who presents with upper abdominal cramping this evening.  No bleeding or leakage of fluid, has not reported fetal movement as of this time.  MEDICATIONS:  She has medications to include Zoloft, Synthroid, prenatal vitamins, and metformin.  SOCIAL HISTORY:  She is a nonsmoker, nondrinker.  She denies domestic or physical violence.  FAMILY HISTORY:  She has a family history of diabetes, hypertension, heart disease, history of a term primary C-section, and a history of a 22-week pregnancy loss.  PAST SURGICAL HISTORY:  Cerclage, C-section, and cholecystectomy.  PAST MEDICAL HISTORY:  Also to include thyroid disease, high-grade intolerance, and depression.  PHYSICAL EXAMINATION:  GENERAL:  She is a well-developed, well-nourished female, in no acute distress. VITAL SIGNS:  Stable.  The patient is afebrile. HEENT:  Normal. NECK:  Supple.  Full range of motion. LUNGS:  Clear. HEART:  Regular rhythm. ABDOMEN:  Soft, gravid, nontender.  Fetal heart tones heard.  No fundal tenderness.  No rebound or guarding.  The patient describes the discomfort in the lower abdominal area, and no lower abdominal cramping. PELVIS:  Reveals cervix to be closed, long with cerclage intact, not palpable.  Cerclage was examined 360 degrees.  No evidence of pull through or interruption. EXTREMITIES:  No cords. NEUROLOGIC:  Nonfocal. SKIN:  Intact.  LABORATORY:  Urinalysis with trace leukocytes and a bit of  __________ bacteria, sent for culture.  IMPRESSION:  A 14-week intrauterine pregnancy, with a history of prophylactic cerclage.  The patient notes upper abdominal cramping. Negative physical exam.  Probable GI related symptoms.  PLAN:  Reassurance given.  Sent urine for culture.  Discharged home. Follow up in the office as scheduled.     Lenoard Aden, M.D.     RJT/MEDQ  D:  03/15/2012  T:  03/15/2012  Job:  [id number]

## 2012-03-15 NOTE — MAU Provider Note (Signed)
  History   Cramping  CSN: [id number]  Arrival date and time: 03/15/12 1734   First Provider Initiated Contact with Patient 03/15/12 1801      Chief Complaint  Patient presents with  . Abdominal Cramping   HPI  OB History   Grav Para Term Preterm Abortions TAB SAB Ect Mult Living   3 2 1 1  0 0 0 0 0 1      Past Medical History  Diagnosis Date  . Abdominal pain, right lower quadrant 03/07/2009  . BENIGN POSITIONAL VERTIGO 12/10/2009  . CHICKENPOX, HX OF 09/26/2008  . CERVICAL INCOMPETENCE 02/24/2008  . DEPRESSION 09/26/2008  . FATTY LIVER DISEASE 03/31/2009  . HEMATOCHEZIA 09/26/2008  . HYPOTHYROIDISM 12/10/2009  . Irritable bowel syndrome 03/31/2009  . IRITIS 03/07/2009  . Polycystic ovaries   . Infertility, female   . Cervical incompetence   . Postpartum care following cesarean delivery (breech 11/16) 12/12/2010  . DIABETES MELLITUS, TYPE II 12/10/2009    metformin  . ANEMIA-NOS 09/26/2008    Past Surgical History  Procedure Laterality Date  . Cholecystectomy  2009  . Cesarean section  12/11/2010    Procedure: CESAREAN SECTION;  Surgeon: Tresa Endo A. Fogleman;  Location: [facility];  Service: Gynecology;  Laterality: N/A;  . Cervical cerclage N/A 03/03/2012    Procedure: CERCLAGE CERVICAL;  Surgeon: Tresa Endo A. Ernestina Penna, MD;  Location: [facility];  Service: Gynecology;  Laterality: N/A;  EDD:  09/07/12    Family History  Problem Relation Age of Onset  . Arthritis Other     Parent  . Alcohol abuse Other     Parent    History  Substance Use Topics  . Smoking status: Never Smoker   . Smokeless tobacco: Never Used     Comment: Married, lives with spouse and his son. 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 Alcohol Use: No    Allergies: No Known Allergies  No prescriptions prior to admission    ROS Physical Exam   Blood pressure 116/61, pulse 77, temperature 98.5 F (36.9 C), temperature source Oral, resp. rate 18, height 5\' 3"  (1.6 m), weight 85.276 kg (188 lb), last  menstrual period 10/29/2011, unknown if currently breastfeeding.  Physical Exam  MAU Course  Procedures  MDM na  Assessment and Plan  14 week IUP status uncomplicated cerclage with abdominal cramping See dictated note  Evie Crumpler J 03/15/2012, 8:08 PM

## 2012-03-16 LAB — URINE CULTURE

## 2012-04-05 ENCOUNTER — Encounter: Payer: Self-pay | Admitting: Internal Medicine

## 2012-04-05 ENCOUNTER — Ambulatory Visit (INDEPENDENT_AMBULATORY_CARE_PROVIDER_SITE_OTHER): Payer: BC Managed Care – PPO | Admitting: Internal Medicine

## 2012-04-05 VITALS — BP 102/70 | HR 89 | Temp 98.6°F | Wt 189.4 lb

## 2012-04-05 MED ORDER — GUAIFENESIN 100 MG/5ML PO LIQD: 200.0000 mg | Freq: Three times a day (TID) | ORAL | Status: DC | PRN

## 2012-04-05 MED ORDER — LORATADINE 10 MG PO TABS: 10.0000 mg | ORAL_TABLET | Freq: Every day | ORAL | Status: DC

## 2012-04-05 NOTE — Progress Notes (Signed)
Subjective:    Patient ID: Jasmine Howard, female    DOB: December 27, 1975, 37 y.o.   MRN: [id number]  Sore Throat  This is a new problem. The current episode started 1 to 4 weeks ago. The pain is worse on the left side. There has been no fever. The pain is at a severity of 7/10. The pain is moderate. Associated symptoms include congestion (nasal), coughing, ear pain (Left), headaches, neck pain, swollen glands and trouble swallowing. Pertinent negatives include no diarrhea, drooling, ear discharge, shortness of breath, stridor or vomiting. She has tried acetaminophen and gargles for the symptoms. The treatment provided moderate relief.   Past Medical History  Diagnosis Date  . BENIGN POSITIONAL VERTIGO   . DEPRESSION   . FATTY LIVER DISEASE   . HYPOTHYROIDISM   . Irritable bowel syndrome   . IRITIS   . Polycystic ovaries   . Infertility, female   . Cervical incompetence     s/p cerclage 03/01/12  . Postpartum care following cesarean delivery (breech 11/16) 12/12/2010  . DIABETES MELLITUS, TYPE II     metformin  . ANEMIA-NOS     Review of Systems  HENT: Positive for ear pain (Left), congestion (nasal), trouble swallowing and neck pain. Negative for drooling and ear discharge.   Respiratory: Positive for cough. Negative for shortness of breath and stridor.   Gastrointestinal: Negative for vomiting and diarrhea.  Neurological: Positive for headaches.       Objective:   Physical Exam BP 102/70  Pulse 89  Temp(Src) 98.6 F (37 C) (Oral)  Wt 189 lb 6.4 oz (85.911 kg)  BMI 33.56 kg/m2  SpO2 98%  LMP 10/29/2011 Wt Readings from Last 3 Encounters:  04/05/12 189 lb 6.4 oz (85.911 kg)  03/15/12 188 lb (85.276 kg)  03/01/12 187 lb (84.823 kg)   Constitutional: She appears pregnant, mildly ill with nasal congestion -but well-developed and well-nourished. No distress.  HENT: Head: Normocephalic and atraumatic. Ears: B TMs ok, no erythema or effusion; Nose: Nose normal. Mouth/Throat:  Oropharynx is clear and moist. No oropharyngeal exudate. small polyp on L tonsil but no exudate or swelling Eyes: Conjunctivae and EOM are normal. Pupils are equal, round, and reactive to light. No scleral icterus.  Neck: Normal range of motion. Neck supple. No JVD present, but B LAD and tender to palp along left side. No thyromegaly present.  Cardiovascular: Normal rate, regular rhythm and normal heart sounds.  No murmur heard. No BLE edema. Pulmonary/Chest: Effort normal and breath sounds normal. No respiratory distress. She has no wheezes. Neurological: She is alert and oriented to person, place, and time. No cranial nerve deficit. Coordination normal.  Skin: Skin is warm and dry. No rash noted. No erythema.  Psychiatric: She has a normal mood and affect. Her behavior is normal. Judgment and thought content normal.    Lab Results  Component Value Date   WBC 8.8 03/01/2012   HGB 11.8* 03/01/2012   HCT 34.8* 03/01/2012   PLT 198 03/01/2012   GLUCOSE 79 03/01/2012   CHOL 154 04/11/2009   HDL 41 04/11/2009   LDLCALC 89 04/11/2009   ALT 11 08/25/2010   AST 12 08/25/2010   NA 133* 03/01/2012   K 3.5 03/01/2012   CL 99 03/01/2012   CREATININE 0.45* 03/01/2012   BUN 6 03/01/2012   CO2 23 03/01/2012   TSH 7.36 04/11/2009   HGBA1C 5.1 05/25/2011       Assessment & Plan:   L neck pain x 3  weeks - progressively worsening, mild LAD on exam sore throat  Viral URI with cough IUP 18 weeks  Arrange soft tissue neck ultrasound rule out peritonsillar abcess Advise symptomatic care: Tylenol 650 twice a day, Claritin 10 mg daily and Robitussin every 6 hours when necessary for cough Hold empiric antibiotics unless fever or abnormal imaging -patient call if symptoms worse or unimproved with conservative care Followup with obstetrics as ongoing for pregnancy

## 2012-04-05 NOTE — Patient Instructions (Signed)
It was good to see you today. If you develop worsening symptoms or fever, call and we can reconsider antibiotics, but it does not appear necessary to use antibiotics at this time. we'll make referral for ultrasound of your neck. Our office will contact you regarding appointment(s) once made. Until then, use Tylenol 500 mg every 6 hours as needed for pain, Claritin 10 mg daily as needed for sneezing and runny nose, Robitussin every 6 hours as needed for cough and continue salt water gargles as ongoing Continue working with your obstetrician for your pregnancy  Sore Throat A sore throat is felt inside the throat and at the back of the mouth. It hurts to swallow or the throat may feel dry and scratchy. It can be caused by germs, smoking, pollution, or allergies.  HOME CARE   Only take medicine as told by your doctor.  Drink enough fluids to keep your pee (urine) clear or pale yellow.  Eat soft foods.  Do not smoke.  Rinse the mouth (gargle) with warm water or salt water ( teaspoon salt in 8 ounces of water).  Try throat sprays, lozenges, or suck on hard candy. GET HELP RIGHT AWAY IF:   You have trouble breathing.  Your sore throat lasts longer than 1 week.  There is more puffiness (swelling) in the throat.  The pain is so bad that you are unable to swallow.  You have a very bad headache or a red rash.  You start to throw up (vomit).  You or your child has a temperature by mouth above 102 F (38.9 C), not controlled by medicine.  Your baby is older than 3 months with a rectal temperature of 102 F (38.9 C) or higher.  Your baby is 40 months old or younger with a rectal temperature of 100.4 F (38 C) or higher. MAKE SURE YOU:   Understand these instructions.  Will watch your condition.  Will get help right away if you are not doing well or get worse. Document Released: 10/21/2007 Document Revised: 04/05/2011 Document Reviewed: 10/21/2007 [facility] Patient Information  2013 [place], [place]. Upper Respiratory Infection, Adult An upper respiratory infection (URI) is also known as the common cold. It is often caused by a type of germ (virus). Colds are easily spread (contagious). You can pass it to others by kissing, coughing, sneezing, or drinking out of the same glass. Usually, you get better in 1 or 2 weeks.  HOME CARE   Only take medicine as told by your doctor.  Use a warm mist humidifier or breathe in steam from a hot shower.  Drink enough water and fluids to keep your pee (urine) clear or pale yellow.  Get plenty of rest.  Return to work when your temperature is back to normal or as told by your doctor. You may use a face mask and wash your hands to stop your cold from spreading. GET HELP RIGHT AWAY IF:   After the first few days, you feel you are getting worse.  You have questions about your medicine.  You have chills, shortness of breath, or brown or red spit (mucus).  You have yellow or brown snot (nasal discharge) or pain in the face, especially when you bend forward.  You have a fever, puffy (swollen) neck, pain when you swallow, or white spots in the back of your throat.  You have a bad headache, ear pain, sinus pain, or chest pain.  You have a high-pitched whistling sound when you breathe in and  out (wheezing).  You have a lasting cough or cough up blood.  You have sore muscles or a stiff neck. MAKE SURE YOU:   Understand these instructions.  Will watch your condition.  Will get help right away if you are not doing well or get worse. Document Released: 06/30/2007 Document Revised: 04/05/2011 Document Reviewed: 05/18/2010 [facility] Patient Information 2013 [place], [place].

## 2012-04-10 ENCOUNTER — Ambulatory Visit
Admission: RE | Admit: 2012-04-10 | Discharge: 2012-04-10 | Disposition: A | Discharge status: Home / Self Care | Payer: BC Managed Care – PPO | Source: Ambulatory Visit | Attending: Internal Medicine | Admitting: Internal Medicine

## 2012-08-01 ENCOUNTER — Encounter (HOSPITAL_COMMUNITY): Payer: Self-pay | Admitting: *Deleted

## 2012-08-01 ENCOUNTER — Inpatient Hospital Stay (HOSPITAL_COMMUNITY)
Admission: AD | Admit: 2012-08-01 | Discharge: 2012-08-01 | Disposition: A | Discharge status: Home / Self Care | Payer: BC Managed Care – PPO | Source: Ambulatory Visit | Attending: Obstetrics | Admitting: Obstetrics

## 2012-08-01 ENCOUNTER — Inpatient Hospital Stay (HOSPITAL_COMMUNITY): Payer: BC Managed Care – PPO

## 2012-08-01 DIAGNOSIS — O99891 Other specified diseases and conditions complicating pregnancy: Secondary | ICD-10-CM | POA: Insufficient documentation

## 2012-08-01 DIAGNOSIS — W1809XA Striking against other object with subsequent fall, initial encounter: Secondary | ICD-10-CM | POA: Insufficient documentation

## 2012-08-01 DIAGNOSIS — Y92009 Unspecified place in unspecified non-institutional (private) residence as the place of occurrence of the external cause: Secondary | ICD-10-CM | POA: Insufficient documentation

## 2012-08-01 DIAGNOSIS — M549 Dorsalgia, unspecified: Secondary | ICD-10-CM | POA: Insufficient documentation

## 2012-08-01 DIAGNOSIS — R109 Unspecified abdominal pain: Secondary | ICD-10-CM | POA: Insufficient documentation

## 2012-08-01 MED ORDER — ACETAMINOPHEN 500 MG PO TABS
1000.0000 mg | ORAL_TABLET | Freq: Once | ORAL | Status: AC
  Administered 2012-08-01: 1000 mg via ORAL
  Filled 2012-08-01: qty 2

## 2012-08-01 NOTE — MAU Provider Note (Signed)
History     Chief Complaint  Patient presents with  . Fall  37 yo G3P1 hispanic female @ 62 6/[redacted] weeks gestation sent by Dr Ernestina Penna for monitoring due to fall w/ c/o back and waist pain. Pt reports falling and hitting left side of hip/buttock (+) FM (-)vaginal bleeding (+) low back pain which started shortly after fall. Hx cervical incompetence with cerclage in place   OB History   Grav Para Term Preterm Abortions TAB SAB Ect Mult Living   3 2 1 1  0 0 0 0 0 1      Past Medical History  Diagnosis Date  . BENIGN POSITIONAL VERTIGO   . DEPRESSION   . FATTY LIVER DISEASE   . HYPOTHYROIDISM   . Irritable bowel syndrome   . IRITIS   . Polycystic ovaries   . Infertility, female   . Cervical incompetence     s/p cerclage 03/01/12  . Postpartum care following cesarean delivery (breech 11/16) 12/12/2010  . DIABETES MELLITUS, TYPE II     metformin  . ANEMIA-NOS     Past Surgical History  Procedure Laterality Date  . Cholecystectomy  2009  . Cesarean section  12/11/2010    Procedure: CESAREAN SECTION;  Surgeon: Tresa Endo A. Fogleman;  Location: [facility];  Service: Gynecology;  Laterality: N/A;  . Cervical cerclage N/A 03/03/2012    Procedure: CERCLAGE CERVICAL;  Surgeon: Tresa Endo A. Ernestina Penna, MD;  Location: [facility];  Service: Gynecology;  Laterality: N/A;  EDD:  09/07/12    Family History  Problem Relation Age of Onset  . Arthritis Other     Parent  . Alcohol abuse Other     Parent    History  Substance Use Topics  . Smoking status: Never Smoker   . Smokeless tobacco: Never Used     Comment: Married, lives with spouse and his son. 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 Alcohol Use: No    Allergies: No Known Allergies  Prescriptions prior to admission  Medication Sig Dispense Refill  . levothyroxine (SYNTHROID, LEVOTHROID) 50 MCG tablet Take 50 mcg by mouth daily.        . metFORMIN (GLUCOPHAGE-XR) 750 MG 24 hr tablet Take 750 mg by mouth 2 (two) times daily.        .  Prenatal Vit-Fe Fumarate-FA (PRENATAL MULTIVITAMIN) TABS Take 1 tablet by mouth daily.         Physical Exam   Blood pressure 108/64, pulse 98, temperature 97.6 F (36.4 C), temperature source Oral, resp. rate 17, height 5\' 3"  (1.6 m), weight 94.62 kg (208 lb 9.6 oz), last menstrual period 10/29/2011, SpO2 98.00%.  General appearance: alert, cooperative and no distress Lungs: clear to auscultation bilaterally Heart: regular rate and rhythm, S1, S2 normal, no murmur, click, rub or gallop Abdomen: soft, non-tender; bowel sounds normal; no masses,  no organomegaly and gravid Pelvic: closed/ OOP/ cerclage intact Extremities: no edema, redness or tenderness in the calves or thighs Left buttock area w/o ecchymosis   Tracing reactive cat 1 no ctx baseline 130-135 (+) accels ED Course  S/p fall IUP @ 34 1/2 week P) monitor x 4 hours. sono for placental eval and efw. Tylenol 1g prn pain  MDM   Raveen Wieseler A, MD 7:03 PM 08/01/2012

## 2012-08-01 NOTE — MAU Note (Signed)
Patient states at about 1600 she fell on her left hip and rolled onto her left side. Is now having back pain and pressure. Reports good fetal movement.

## 2012-08-01 NOTE — Progress Notes (Signed)
Md informed of patients back and abdominal pain. Tylenol order received.

## 2012-08-01 NOTE — Progress Notes (Signed)
Md informed of patients arrival. Requested patient placed on monitor for 4 hours of monitoring. No other orders received.

## 2012-08-12 ENCOUNTER — Inpatient Hospital Stay (HOSPITAL_COMMUNITY)
Admission: AD | Admit: 2012-08-12 | Discharge: 2012-08-13 | Disposition: A | Discharge status: Home / Self Care | Payer: BC Managed Care – PPO | Source: Ambulatory Visit | Attending: Obstetrics and Gynecology | Admitting: Obstetrics and Gynecology

## 2012-08-12 ENCOUNTER — Encounter (HOSPITAL_COMMUNITY): Payer: Self-pay | Admitting: *Deleted

## 2012-08-12 DIAGNOSIS — N949 Unspecified condition associated with female genital organs and menstrual cycle: Secondary | ICD-10-CM | POA: Insufficient documentation

## 2012-08-12 DIAGNOSIS — O26893 Other specified pregnancy related conditions, third trimester: Secondary | ICD-10-CM

## 2012-08-12 DIAGNOSIS — O3430 Maternal care for cervical incompetence, unspecified trimester: Secondary | ICD-10-CM | POA: Insufficient documentation

## 2012-08-12 NOTE — MAU Note (Addendum)
PT SAYS SHE STARTED HURTING BAD AT 715PM.   YESTERDAY-   THOUGHT HAD FLUID LEAKING-  NONE TODAY.   HAS CERCLAGE IN -- AT 12 WEEKS.      DENIES HSV AND MRSA.   PLAN IS TO REMOVE CERCLAGE ON Tuesday.

## 2012-08-13 NOTE — MAU Provider Note (Signed)
History     CSN: [id number]  Arrival date and time: 08/12/12 2237 Nurse call to provider@1241  Provider here to see patient @0109     No chief complaint on file.  HPI Pressure on right side most of today now directly on pubic bone Increased vaginal discharge - white without any odor / scan amount No bleeding No pain  Past Medical History  Diagnosis Date  . BENIGN POSITIONAL VERTIGO   . DEPRESSION   . FATTY LIVER DISEASE   . HYPOTHYROIDISM   . Irritable bowel syndrome   . IRITIS   . Polycystic ovaries   . Infertility, female   . Cervical incompetence     s/p cerclage 03/01/12  . Postpartum care following cesarean delivery (breech 11/16) 12/12/2010  . DIABETES MELLITUS, TYPE II     metformin  . ANEMIA-NOS     Past Surgical History  Procedure Laterality Date  . Cholecystectomy  2009  . Cesarean section  12/11/2010    Procedure: CESAREAN SECTION;  Surgeon: Tresa Endo A. Fogleman;  Location: [facility];  Service: Gynecology;  Laterality: N/A;  . Cervical cerclage N/A 03/03/2012    Procedure: CERCLAGE CERVICAL;  Surgeon: Tresa Endo A. Ernestina Penna, MD;  Location: [facility];  Service: Gynecology;  Laterality: N/A;  EDD:  09/07/12    Family History  Problem Relation Age of Onset  . Arthritis Other     Parent  . Alcohol abuse Other     Parent    History  Substance Use Topics  . Smoking status: Never Smoker   . Smokeless tobacco: Never Used     Comment: Married, lives with spouse and his son. 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 Alcohol Use: No    Allergies: No Known Allergies  Prescriptions prior to admission  Medication Sig Dispense Refill  . levothyroxine (SYNTHROID, LEVOTHROID) 50 MCG tablet Take 50 mcg by mouth daily.        . metFORMIN (GLUCOPHAGE-XR) 750 MG 24 hr tablet Take 750 mg by mouth 2 (two) times daily.        . Prenatal Vit-Fe Fumarate-FA (PRENATAL MULTIVITAMIN) TABS Take 1 tablet by mouth daily.        ROS Physical Exam   Blood pressure 111/69, pulse  73, temperature 97.2 F (36.2 C), temperature source Oral, resp. rate 20, height 5\' 2"  (1.575 m), weight 94.348 kg (208 lb), last menstrual period 10/29/2011.  Physical Exam Calm and comfortable / NAD Abdomen soft and non-tender / uterus gravid and non-tender Vaginal: no bleeding or fluid leakage / scant white discharge c/x leukorrhea               Stitch intact no membranes seen                Cervix closed / soft / unable to determine effacement - feels about 1 cm length                            No LUSD / cephalic at -3 station / no pressure on stitch  FHR 125 / moderate variability / no decels / + accels / reactive NST toco - no regular uterine activity  MAU Course  Procedures None  Assessment and Plan  36 3/7 weeks with cerclage - reactive FHR tracing / category 1 Hx pregnancy loss at 22 weeks Planned cerclage removal Tuesday  No evidence of labor Cerclage intact - no pressure on stitch / no immediate concern for disruption  of stitch  DC home  Marlinda Mike 08/13/2012, 1:08 AM

## 2012-08-30 ENCOUNTER — Encounter (HOSPITAL_COMMUNITY): Payer: Self-pay | Admitting: Pharmacist

## 2012-08-31 ENCOUNTER — Other Ambulatory Visit (HOSPITAL_COMMUNITY): Payer: BC Managed Care – PPO

## 2012-08-31 ENCOUNTER — Other Ambulatory Visit: Payer: Self-pay | Admitting: Obstetrics

## 2012-08-31 ENCOUNTER — Inpatient Hospital Stay (HOSPITAL_COMMUNITY)
Admission: RE | Admit: 2012-08-31 | Discharge: 2012-08-31 | Disposition: A | Discharge status: Home / Self Care | Payer: BC Managed Care – PPO | Source: Ambulatory Visit

## 2012-08-31 NOTE — Patient Instructions (Addendum)
Your procedure is scheduled on:09/01/12  Enter through the Main Entrance at :1230 pm Pick up desk phone and dial [phone] and inform us of your arrival.  Please call [phone] if you have any problems the morning of surgery.  Remember: Do not eat food after midnight: tonight  Water ok until 8am Friday   You may brush your teeth the morning of surgery.  Take these meds the morning of surgery with a sip of water:Thyroid pill (DO NOT TAKE METFORMIN TODAY, TONIGHT OR Friday MORNING  DO NOT wear jewelry, eye make-up, lipstick,body lotion, or dark fingernail polish.  (Polished toes are ok) You may wear deodorant.  If you are to be admitted after surgery, leave suitcase in car until your room has been assigned. Patients discharged on the day of surgery will not be allowed to drive home. Wear loose fitting, comfortable clothes for your ride home.

## 2012-08-31 NOTE — H&P (Signed)
Jasmine Howard is a 37 y.o. G3P1101 at 39wd presenting for RCS. Pt notes occasional contractions. Good fetal movement, No vaginal bleeding, not leaking fluid. Was initially planning VBAC but given GDM, LGA, and not labor/ fetal descent by 39 wks, decision made to proceed with RCS.  PNCare at [facility] since 7 wks - h/o PCS for breech presentation. Now planning RCS - cervical incompetence, 22 wk loss as a G1 due to incompetent cvx. 12 wk cerclage placed in 2nd pregnancy and again in this current pregnancy. Removed at 36 wks - h/o urinary retention- occurred after PCS and again after cerclage. Needed prolonged catheterization. Will plan delayed foley removal on day 3. - stress/ anxiety/ depression. Pt separating from husband during current pregnancy. H/o anxiety/ depression in past, more problematic this pregnancy. Will have SW consult, continue Zoloft 50 mg/ day - Hypothyroidism, dose adjustment during preg, cont 50 mcg and test again at [facility] visit - GERD. Cont Zantac - GDM/ PCOS. Pt w/ pre-preg use of metformin, 500mg  po bid. 12 wk DS at 130 and pt continued metformin through preg. 28 wk GTT 154 and nl 3 hr GTT (all while on metformin) - obese, 25# wt gain - LGA baby w/ borderline poly at 37 wks - PIH in prior preg   Prenatal Transfer Tool  Maternal Diabetes: Yes:  Diabetes Type:  Insulin/Medication controlled Genetic Screening: Normal Maternal Ultrasounds/Referrals: Normal Fetal Ultrasounds or other Referrals:  None Maternal Substance Abuse:  No Significant Maternal Medications:  Meds include: Zantac Zoloft Syntroid Other: metformin Significant Maternal Lab Results: None     OB History   Grav Para Term Preterm Abortions TAB SAB Ect Mult Living   3 2 1 1  0 0 0 0 0 1     Past Medical History  Diagnosis Date  . BENIGN POSITIONAL VERTIGO   . DEPRESSION   . FATTY LIVER DISEASE   . HYPOTHYROIDISM   . Irritable bowel syndrome   . IRITIS   . Polycystic ovaries   . Infertility,  female   . Cervical incompetence     s/p cerclage 03/01/12  . Postpartum care following cesarean delivery (breech 11/16) 12/12/2010  . DIABETES MELLITUS, TYPE II     metformin  . ANEMIA-NOS    Past Surgical History  Procedure Laterality Date  . Cholecystectomy  2009  . Cesarean section  12/11/2010    Procedure: CESAREAN SECTION;  Surgeon: Tresa Endo A. Adea Geisel;  Location: [facility];  Service: Gynecology;  Laterality: N/A;  . Cervical cerclage N/A 03/03/2012    Procedure: CERCLAGE CERVICAL;  Surgeon: Tresa Endo A. Ernestina Penna, MD;  Location: [facility];  Service: Gynecology;  Laterality: N/A;  EDD:  09/07/12   Family History: family history includes Alcohol abuse in her other and Arthritis in her other. Social History:  reports that she has never smoked. She has never used smokeless tobacco. She reports that she does not drink alcohol or use illicit drugs.  Review of Systems - Negative except pregnancy     Last menstrual period 10/29/2011.  Physical Exam:  Gen: well appearing, no distress  Back: no CVAT Abd: gravid, NT, no RUQ pain LE: trace edema, equal bilaterally, non-tender   Prenatal labs: ABO, Rh: --/--/B POS (02/05 1300) Antibody: NEG (02/05 1300) Rubella:  immune RPR: Nonreactive (12/31 1530)  HBsAg:   neg HIV: Non-reactive (12/31 1530)  GBS:   neg 1 hr Glucola early 130, 28 wk while on metfromin 154  Genetic screening nl Anatomy US nl   Assessment/Plan:  37 y.o. G3P1101 at [redacted]w[redacted]d for RCS RCS as planned, pt agrees to R/B of PCS - h/o urinary retention. Will plan delayed foley removal on day 3. - stress/ anxiety/ depression. Pt separating from husband during current pregnancy.  Will have SW consult, continue Zoloft 50 mg/ day - Hypothyroidism, dose adjustment during preg, cont 50 mcg and test again at [facility] visit - GERD. Cont Zantac - GDM/ PCOS. Cont metformin 500mg  po bid - h/o PIH, watch closely - scar revision   Jasmine Howard A. 08/31/2012, 11:46 PM

## 2012-08-31 NOTE — Pre-Procedure Instructions (Signed)
PT ARRIVED AN HOUR EARLY FOR HER PAT AND NO RN AVAILABLE TO SEE HER. PREOP INST PRINTED AND GIVEN TO PT. PT HAD PAT APPT AT 2;30 BUT SAID SHE HAD A MEETING AND CAME EARLY.

## 2012-09-01 ENCOUNTER — Inpatient Hospital Stay (HOSPITAL_COMMUNITY)
Admission: RE | Admit: 2012-09-01 | Discharge: 2012-09-04 | DRG: 370 | Disposition: A | Discharge status: Home / Self Care | Payer: BC Managed Care – PPO | Source: Ambulatory Visit | Attending: Obstetrics | Admitting: Obstetrics

## 2012-09-01 ENCOUNTER — Encounter (HOSPITAL_COMMUNITY): Payer: Self-pay | Admitting: Anesthesiology

## 2012-09-01 ENCOUNTER — Encounter (HOSPITAL_COMMUNITY)
Admission: RE | Disposition: A | Discharge status: Home / Self Care | Payer: Self-pay | Source: Ambulatory Visit | Attending: Obstetrics

## 2012-09-01 ENCOUNTER — Encounter (HOSPITAL_COMMUNITY): Payer: Self-pay | Admitting: *Deleted

## 2012-09-01 ENCOUNTER — Inpatient Hospital Stay (HOSPITAL_COMMUNITY): Payer: BC Managed Care – PPO | Admitting: Anesthesiology

## 2012-09-01 DIAGNOSIS — O3660X0 Maternal care for excessive fetal growth, unspecified trimester, not applicable or unspecified: Secondary | ICD-10-CM | POA: Diagnosis present

## 2012-09-01 DIAGNOSIS — L910 Hypertrophic scar: Secondary | ICD-10-CM | POA: Diagnosis present

## 2012-09-01 DIAGNOSIS — D62 Acute posthemorrhagic anemia: Secondary | ICD-10-CM | POA: Diagnosis not present

## 2012-09-01 DIAGNOSIS — O3430 Maternal care for cervical incompetence, unspecified trimester: Secondary | ICD-10-CM | POA: Diagnosis present

## 2012-09-01 DIAGNOSIS — E039 Hypothyroidism, unspecified: Secondary | ICD-10-CM | POA: Diagnosis present

## 2012-09-01 DIAGNOSIS — O9903 Anemia complicating the puerperium: Secondary | ICD-10-CM | POA: Diagnosis not present

## 2012-09-01 DIAGNOSIS — E669 Obesity, unspecified: Secondary | ICD-10-CM | POA: Diagnosis present

## 2012-09-01 DIAGNOSIS — E079 Disorder of thyroid, unspecified: Secondary | ICD-10-CM | POA: Diagnosis present

## 2012-09-01 DIAGNOSIS — O34219 Maternal care for unspecified type scar from previous cesarean delivery: Principal | ICD-10-CM | POA: Diagnosis present

## 2012-09-01 DIAGNOSIS — O99284 Endocrine, nutritional and metabolic diseases complicating childbirth: Secondary | ICD-10-CM | POA: Diagnosis present

## 2012-09-01 DIAGNOSIS — O99814 Abnormal glucose complicating childbirth: Secondary | ICD-10-CM | POA: Diagnosis present

## 2012-09-01 HISTORY — PX: SCAR REVISION: SHX5285

## 2012-09-01 LAB — GLUCOSE, CAPILLARY: Glucose-Capillary: 62 mg/dL — ABNORMAL LOW (ref 70–99)

## 2012-09-01 LAB — CBC
HCT: 37.7 % (ref 36.0–46.0)
MCHC: 34.2 g/dL (ref 30.0–36.0)
Platelets: 177 10*3/uL (ref 150–400)
RDW: 14 % (ref 11.5–15.5)
WBC: 9.5 10*3/uL (ref 4.0–10.5)

## 2012-09-01 LAB — TYPE AND SCREEN: Antibody Screen: NEGATIVE

## 2012-09-01 SURGERY — Surgical Case
Anesthesia: Spinal | Site: Abdomen | Wound class: Clean Contaminated

## 2012-09-01 MED ORDER — OXYTOCIN 10 UNIT/ML IJ SOLN
INTRAMUSCULAR | Status: AC
  Filled 2012-09-01: qty 4

## 2012-09-01 MED ORDER — TRIAMCINOLONE ACETONIDE 40 MG/ML IJ SUSP
Freq: Once | INTRAMUSCULAR | Status: AC
  Administered 2012-09-01: 16:00:00 via INTRAMUSCULAR
  Filled 2012-09-01 (×2): qty 1

## 2012-09-01 MED ORDER — SCOPOLAMINE 1 MG/3DAYS TD PT72: 1.0000 | MEDICATED_PATCH | Freq: Once | TRANSDERMAL | Status: DC

## 2012-09-01 MED ORDER — KETOROLAC TROMETHAMINE 60 MG/2ML IM SOLN: 60.0000 mg | Freq: Once | INTRAMUSCULAR | Status: AC | PRN

## 2012-09-01 MED ORDER — CEFAZOLIN SODIUM-DEXTROSE 2-3 GM-% IV SOLR
INTRAVENOUS | Status: AC
  Filled 2012-09-01: qty 50

## 2012-09-01 MED ORDER — CEFAZOLIN SODIUM-DEXTROSE 2-3 GM-% IV SOLR
2.0000 g | Freq: Once | INTRAVENOUS | Status: AC
  Administered 2012-09-01: 2 g via INTRAVENOUS

## 2012-09-01 MED ORDER — MEPERIDINE HCL 25 MG/ML IJ SOLN: 6.2500 mg | INTRAMUSCULAR | Status: DC | PRN

## 2012-09-01 MED ORDER — PHENYLEPHRINE 40 MCG/ML (10ML) SYRINGE FOR IV PUSH (FOR BLOOD PRESSURE SUPPORT)
PREFILLED_SYRINGE | INTRAVENOUS | Status: AC
  Filled 2012-09-01: qty 5

## 2012-09-01 MED ORDER — LACTATED RINGERS IV SOLN
INTRAVENOUS | Status: DC | PRN
  Administered 2012-09-01: 15:00:00 via INTRAVENOUS

## 2012-09-01 MED ORDER — ONDANSETRON HCL 4 MG/2ML IJ SOLN: 4.0000 mg | INTRAMUSCULAR | Status: DC | PRN

## 2012-09-01 MED ORDER — KETOROLAC TROMETHAMINE 60 MG/2ML IM SOLN
INTRAMUSCULAR | Status: AC
  Administered 2012-09-01: 60 mg via INTRAMUSCULAR
  Filled 2012-09-01: qty 2

## 2012-09-01 MED ORDER — ONDANSETRON HCL 4 MG/2ML IJ SOLN: 4.0000 mg | Freq: Three times a day (TID) | INTRAMUSCULAR | Status: DC | PRN

## 2012-09-01 MED ORDER — DIPHENHYDRAMINE HCL 50 MG/ML IJ SOLN: 12.5000 mg | INTRAMUSCULAR | Status: DC | PRN

## 2012-09-01 MED ORDER — SIMETHICONE 80 MG PO CHEW
80.0000 mg | CHEWABLE_TABLET | Freq: Three times a day (TID) | ORAL | Status: DC
  Administered 2012-09-02 – 2012-09-04 (×9): 80 mg via ORAL

## 2012-09-01 MED ORDER — METFORMIN HCL ER 500 MG PO TB24
500.0000 mg | ORAL_TABLET | Freq: Two times a day (BID) | ORAL | Status: DC
  Administered 2012-09-02 – 2012-09-04 (×5): 500 mg via ORAL
  Filled 2012-09-01 (×5): qty 1

## 2012-09-01 MED ORDER — MENTHOL 3 MG MT LOZG: 1.0000 | LOZENGE | OROMUCOSAL | Status: DC | PRN

## 2012-09-01 MED ORDER — SENNOSIDES-DOCUSATE SODIUM 8.6-50 MG PO TABS
2.0000 | ORAL_TABLET | Freq: Every day | ORAL | Status: DC
  Administered 2012-09-02 – 2012-09-03 (×2): 2 via ORAL

## 2012-09-01 MED ORDER — IBUPROFEN 600 MG PO TABS
600.0000 mg | ORAL_TABLET | Freq: Four times a day (QID) | ORAL | Status: DC
  Administered 2012-09-02 – 2012-09-04 (×10): 600 mg via ORAL
  Filled 2012-09-01 (×10): qty 1

## 2012-09-01 MED ORDER — NALBUPHINE HCL 10 MG/ML IJ SOLN: 5.0000 mg | INTRAMUSCULAR | Status: DC | PRN

## 2012-09-01 MED ORDER — LACTATED RINGERS IV SOLN
INTRAVENOUS | Status: DC
  Administered 2012-09-02: 05:00:00 via INTRAVENOUS

## 2012-09-01 MED ORDER — WITCH HAZEL-GLYCERIN EX PADS: 1.0000 "application " | MEDICATED_PAD | CUTANEOUS | Status: DC | PRN

## 2012-09-01 MED ORDER — NALOXONE HCL 1 MG/ML IJ SOLN: 1.0000 ug/kg/h | INTRAVENOUS | Status: DC | PRN

## 2012-09-01 MED ORDER — OXYTOCIN 40 UNITS IN LACTATED RINGERS INFUSION - SIMPLE MED: 62.5000 mL/h | INTRAVENOUS | Status: AC

## 2012-09-01 MED ORDER — BUPIVACAINE IN DEXTROSE 0.75-8.25 % IT SOLN
INTRATHECAL | Status: DC | PRN
  Administered 2012-09-01: 1.3 mL via INTRATHECAL

## 2012-09-01 MED ORDER — KETOROLAC TROMETHAMINE 30 MG/ML IJ SOLN: 30.0000 mg | Freq: Four times a day (QID) | INTRAMUSCULAR | Status: AC | PRN

## 2012-09-01 MED ORDER — DIPHENHYDRAMINE HCL 50 MG/ML IJ SOLN: 25.0000 mg | INTRAMUSCULAR | Status: DC | PRN

## 2012-09-01 MED ORDER — SCOPOLAMINE 1 MG/3DAYS TD PT72
MEDICATED_PATCH | TRANSDERMAL | Status: AC
  Administered 2012-09-01: 1.5 mg via TRANSDERMAL
  Filled 2012-09-01: qty 1

## 2012-09-01 MED ORDER — TETANUS-DIPHTH-ACELL PERTUSSIS 5-2.5-18.5 LF-MCG/0.5 IM SUSP: 0.5000 mL | Freq: Once | INTRAMUSCULAR | Status: DC

## 2012-09-01 MED ORDER — ONDANSETRON HCL 4 MG/2ML IJ SOLN
INTRAMUSCULAR | Status: AC
  Filled 2012-09-01: qty 2

## 2012-09-01 MED ORDER — DIBUCAINE 1 % RE OINT: 1.0000 "application " | TOPICAL_OINTMENT | RECTAL | Status: DC | PRN

## 2012-09-01 MED ORDER — PHENYLEPHRINE HCL 10 MG/ML IJ SOLN
INTRAMUSCULAR | Status: DC | PRN
  Administered 2012-09-01: 80 ug via INTRAVENOUS
  Administered 2012-09-01: 40 ug via INTRAVENOUS
  Administered 2012-09-01 (×5): 80 ug via INTRAVENOUS

## 2012-09-01 MED ORDER — OXYTOCIN 10 UNIT/ML IJ SOLN
40.0000 [IU] | INTRAVENOUS | Status: DC | PRN
  Administered 2012-09-01: 40 [IU] via INTRAVENOUS

## 2012-09-01 MED ORDER — SIMETHICONE 80 MG PO CHEW: 80.0000 mg | CHEWABLE_TABLET | ORAL | Status: DC | PRN

## 2012-09-01 MED ORDER — PRENATAL MULTIVITAMIN CH
1.0000 | ORAL_TABLET | Freq: Every day | ORAL | Status: DC
  Administered 2012-09-02 – 2012-09-03 (×2): 1 via ORAL
  Filled 2012-09-01 (×2): qty 1

## 2012-09-01 MED ORDER — SODIUM CHLORIDE 0.9 % IJ SOLN: 3.0000 mL | INTRAMUSCULAR | Status: DC | PRN

## 2012-09-01 MED ORDER — MORPHINE SULFATE (PF) 0.5 MG/ML IJ SOLN
INTRAMUSCULAR | Status: DC | PRN
  Administered 2012-09-01: .1 mg via INTRATHECAL

## 2012-09-01 MED ORDER — LEVOTHYROXINE SODIUM 50 MCG PO TABS
50.0000 ug | ORAL_TABLET | Freq: Every day | ORAL | Status: DC
  Administered 2012-09-02 – 2012-09-04 (×3): 50 ug via ORAL
  Filled 2012-09-01 (×3): qty 1

## 2012-09-01 MED ORDER — OXYCODONE-ACETAMINOPHEN 5-325 MG PO TABS
1.0000 | ORAL_TABLET | ORAL | Status: DC | PRN
  Administered 2012-09-02: 1 via ORAL
  Administered 2012-09-02: 2 via ORAL
  Administered 2012-09-02: 1 via ORAL
  Administered 2012-09-02 – 2012-09-04 (×4): 2 via ORAL
  Administered 2012-09-04: 1 via ORAL
  Administered 2012-09-04: 2 via ORAL
  Filled 2012-09-01 (×4): qty 2
  Filled 2012-09-01 (×2): qty 1
  Filled 2012-09-01 (×2): qty 2
  Filled 2012-09-01: qty 1

## 2012-09-01 MED ORDER — FENTANYL CITRATE 0.05 MG/ML IJ SOLN
25.0000 ug | INTRAMUSCULAR | Status: DC | PRN
  Administered 2012-09-01: 50 ug via INTRAVENOUS

## 2012-09-01 MED ORDER — FENTANYL CITRATE 0.05 MG/ML IJ SOLN
INTRAMUSCULAR | Status: AC
  Filled 2012-09-01: qty 2

## 2012-09-01 MED ORDER — LACTATED RINGERS IV SOLN
INTRAVENOUS | Status: DC
  Administered 2012-09-01 (×3): via INTRAVENOUS

## 2012-09-01 MED ORDER — MORPHINE SULFATE 0.5 MG/ML IJ SOLN
INTRAMUSCULAR | Status: AC
  Filled 2012-09-01: qty 10

## 2012-09-01 MED ORDER — PHENYLEPHRINE 40 MCG/ML (10ML) SYRINGE FOR IV PUSH (FOR BLOOD PRESSURE SUPPORT)
PREFILLED_SYRINGE | INTRAVENOUS | Status: AC
  Filled 2012-09-01: qty 15

## 2012-09-01 MED ORDER — HYDROMORPHONE HCL PF 1 MG/ML IJ SOLN
1.0000 mg | Freq: Once | INTRAMUSCULAR | Status: AC
  Administered 2012-09-01: 1 mg via INTRAVENOUS
  Filled 2012-09-01: qty 1

## 2012-09-01 MED ORDER — EPHEDRINE 5 MG/ML INJ
INTRAVENOUS | Status: AC
  Filled 2012-09-01: qty 10

## 2012-09-01 MED ORDER — NALOXONE HCL 0.4 MG/ML IJ SOLN: 0.4000 mg | INTRAMUSCULAR | Status: DC | PRN

## 2012-09-01 MED ORDER — ONDANSETRON HCL 4 MG/2ML IJ SOLN
INTRAMUSCULAR | Status: DC | PRN
  Administered 2012-09-01: 4 mg via INTRAVENOUS

## 2012-09-01 MED ORDER — SCOPOLAMINE 1 MG/3DAYS TD PT72: 1.0000 | MEDICATED_PATCH | TRANSDERMAL | Status: DC

## 2012-09-01 MED ORDER — METOCLOPRAMIDE HCL 5 MG/ML IJ SOLN: 10.0000 mg | Freq: Three times a day (TID) | INTRAMUSCULAR | Status: DC | PRN

## 2012-09-01 MED ORDER — DIPHENHYDRAMINE HCL 25 MG PO CAPS: 25.0000 mg | ORAL_CAPSULE | ORAL | Status: DC | PRN

## 2012-09-01 MED ORDER — DIPHENHYDRAMINE HCL 25 MG PO CAPS: 25.0000 mg | ORAL_CAPSULE | Freq: Four times a day (QID) | ORAL | Status: DC | PRN

## 2012-09-01 MED ORDER — ZOLPIDEM TARTRATE 5 MG PO TABS: 5.0000 mg | ORAL_TABLET | Freq: Every evening | ORAL | Status: DC | PRN

## 2012-09-01 MED ORDER — EPHEDRINE SULFATE 50 MG/ML IJ SOLN
INTRAMUSCULAR | Status: DC | PRN
  Administered 2012-09-01: 10 mg via INTRAVENOUS
  Administered 2012-09-01 (×3): 5 mg via INTRAVENOUS

## 2012-09-01 MED ORDER — ONDANSETRON HCL 4 MG PO TABS: 4.0000 mg | ORAL_TABLET | ORAL | Status: DC | PRN

## 2012-09-01 MED ORDER — FENTANYL CITRATE 0.05 MG/ML IJ SOLN
INTRAMUSCULAR | Status: AC
  Administered 2012-09-01: 50 ug via INTRAVENOUS
  Filled 2012-09-01: qty 2

## 2012-09-01 MED ORDER — FENTANYL CITRATE 0.05 MG/ML IJ SOLN
INTRAMUSCULAR | Status: DC | PRN
  Administered 2012-09-01: 15 ug via INTRATHECAL

## 2012-09-01 MED ORDER — LANOLIN HYDROUS EX OINT: 1.0000 "application " | TOPICAL_OINTMENT | CUTANEOUS | Status: DC | PRN

## 2012-09-01 SURGICAL SUPPLY — 33 items
CLAMP CORD UMBIL (MISCELLANEOUS) IMPLANT
CLOTH BEACON ORANGE TIMEOUT ST (SAFETY) ×3 IMPLANT
CONTAINER PREFILL 10% NBF 15ML (MISCELLANEOUS) IMPLANT
DRAPE LG THREE QUARTER DISP (DRAPES) ×3 IMPLANT
DRSG OPSITE POSTOP 4X10 (GAUZE/BANDAGES/DRESSINGS) ×3 IMPLANT
DURAPREP 26ML APPLICATOR (WOUND CARE) ×3 IMPLANT
ELECT REM PT RETURN 9FT ADLT (ELECTROSURGICAL) ×3
ELECTRODE REM PT RTRN 9FT ADLT (ELECTROSURGICAL) ×2 IMPLANT
EXTRACTOR VACUUM KIWI (MISCELLANEOUS) IMPLANT
EXTRACTOR VACUUM M CUP 4 TUBE (SUCTIONS) IMPLANT
GLOVE BIO SURGEON STRL SZ 6.5 (GLOVE) ×3 IMPLANT
GLOVE BIOGEL PI IND STRL 7.0 (GLOVE) ×2 IMPLANT
GLOVE BIOGEL PI INDICATOR 7.0 (GLOVE) ×1
GOWN STRL REIN XL XLG (GOWN DISPOSABLE) ×6 IMPLANT
KIT ABG SYR 3ML LUER SLIP (SYRINGE) IMPLANT
NDL HYPO 25X5/8 SAFETYGLIDE (NEEDLE) IMPLANT
NEEDLE HYPO 25X5/8 SAFETYGLIDE (NEEDLE) IMPLANT
NS IRRIG 1000ML POUR BTL (IV SOLUTION) ×3 IMPLANT
PACK C SECTION WH (CUSTOM PROCEDURE TRAY) ×3 IMPLANT
PAD OB MATERNITY 4.3X12.25 (PERSONAL CARE ITEMS) ×3 IMPLANT
STAPLER VISISTAT 35W (STAPLE) IMPLANT
STRIP CLOSURE SKIN 1/2X4 (GAUZE/BANDAGES/DRESSINGS) IMPLANT
SUT MON AB 4-0 PS1 27 (SUTURE) IMPLANT
SUT PLAIN 0 NONE (SUTURE) IMPLANT
SUT PLAIN 2 0 XLH (SUTURE) ×1 IMPLANT
SUT VIC AB 0 CT1 36 (SUTURE) ×8 IMPLANT
SUT VIC AB 0 CTX 36 (SUTURE) ×9
SUT VIC AB 0 CTX36XBRD ANBCTRL (SUTURE) ×6 IMPLANT
SUT VIC AB 2-0 CT1 27 (SUTURE) ×3
SUT VIC AB 2-0 CT1 TAPERPNT 27 (SUTURE) ×2 IMPLANT
TOWEL OR 17X24 6PK STRL BLUE (TOWEL DISPOSABLE) ×9 IMPLANT
TRAY FOLEY CATH 14FR (SET/KITS/TRAYS/PACK) IMPLANT
WATER STERILE IRR 1000ML POUR (IV SOLUTION) ×3 IMPLANT

## 2012-09-01 NOTE — Op Note (Signed)
09/01/2012  3:45 PM  PATIENT:  Jasmine Howard  37 y.o. female  PRE-OPERATIVE DIAGNOSIS:  Previous Cesarean Section, keloid pfanensteil scar  POST-OPERATIVE DIAGNOSIS:  Previous Cesarean Section, keloid pfanensteil scar  PROCEDURE:  Procedure(s) with comments: Repeat CESAREAN SECTION (N/A) - EDD: 09/07/12 SCAR REVISION - cesarean section scar revision with Kenalog injection  SURGEON:  Surgeon(s) and Role:    * Jeanice Dempsey A. Ernestina Penna, MD - Primary  PHYSICIAN ASSISTANT:   ASSISTANTS: none   ANESTHESIA:   spinal  EBL:  Total I/O In: 3300 [I.V.:3300] Out: 1100 [Urine:200; Blood:900]  BLOOD ADMINISTERED:none  DRAINS: Urinary Catheter (Foley)   LOCAL MEDICATIONS USED:  OTHER kenalog injection  SPECIMEN:  Source of Specimen:  placenta  DISPOSITION OF SPECIMEN:  L&D  COUNTS:  YES  TOURNIQUET:  * No tourniquets in log *  DICTATION: .Note written in EPIC  PLAN OF CARE: Admit to inpatient   PATIENT DISPOSITION:  PACU - hemodynamically stable.   Delay start of Pharmacological VTE agent (>24hrs) due to surgical blood loss or risk of bleeding: yes   Findings:  @BABYSEXEBC @ infant,  APGAR (1 MIN): 9   APGAR (5 MINS): 9   APGAR (10 MINS):    Nl uterus, tubes, ovaries, clear amnitic fluid, viable female, DOA; thick scarring of rectus muscles, fascia and peritoneum EBL: 900 cc Antibiotics:  2g Ancef  Complications: none  Indications: This is a 37 y.o. year-old, G3P1101  At [redacted]w[redacted]d admitted for RCS, elective. Risks benefits and alternatives of the procedure were discussed with the patient who agreed to proceed  Procedure:  After informed consent was obtained the patient was taken to the operating room where spinal anesthesia was initiated w/o difficulty.  She was prepped and draped in the normal sterile fashion in dorsal supine position with a leftward tilt.  A foley catheter was in place.  An elliptical incision around the old Pfannenstiel scar was made. Using an Allis clamp to elevate  the scarred area, this was removed sharpy.  Dissection was then  carried down with the Bovie cautery until the fascia was reached. Poor definition of the fascia was noted due to scarring. The fascia was incised in the midline. The incision was extended laterally with the Mayo scissors. The inferior aspect of the fascial incision was grasped with the Coker clamps, elevated up and the underlying rectus muscles were dissected off sharply and with bovie cautery. The superior aspect of the fascial incision was grasped with the Coker clamps elevated up and the underlying rectus muscles were dissected off sharply and with bovie cautery.  The peritoneum was entered bluntly and the fascial incisions were then extended superiorly. The peritoneal incision was extended bluntly with good visualization of the bladder. The bladder blade was inserted and palpation was done to assess the fetal position and the location of the uterine vessels. The vesicouterine peritoneum was grasped with a pickup and entered sharply. Incision was extended with the Metzenbaum and the bladder flap was created bluntly. The bladder blade was then replaced. The lower segment of the uterus was incised sharply with the scalpel and extended bluntly in the AP dimension.  The infant was grasped though initial attempt to deliver the head was unsuccesful. A vacuum was placed on the occiput and the head delivered with fundal pressure and the vacuum removed. Nuchal cord x 1 was loose and reduced. The remainder of the infant delivered without complication. The nose and mouth were bulb suctioned. The cord was clamped and cut. The infant was  handed off to the waiting pediatrician. The placenta was expressed. The uterus was left in situ. Retractors were placed and the edges of the incisino grasped with T clamps.  The uterus was cleared of all clots and debris. The uterine incision was repaired with 0 Vicryl in a running locked fashion.  A second layer of the same  suture was used in an imbricating fashion to obtain excellent hemostasis. Aggressive bleeding was noted from the Left angle of the incision and several sutures were used to control this. The gutters were cleared of all clots and debris, the ovaries and tubes were found to be normal. The uterine incision was reinspected and found to be hemostatic. The peritoneum was grasped and closed with 2-0 Vicryl in a running fashion. The cut muscle edges and the underside of the fascia were inspected and found to be hemostatic. The fascia was closed with 0 Vicryl in a single layer. The subcutaneous tissue was irrigated. Scarpa's layer was closed with a 2-0 plain gut suture. The skin was closed with a 4-0 Monocryl in a single layer. Kenalog was injected. The patient tolerated the procedure well. Sponge lap and needle counts were correct x3 and patient was taken to the recovery room in a stable condition.  Jasmine Howard A. 09/01/2012 3:47 PM

## 2012-09-01 NOTE — Transfer of Care (Signed)
Immediate Anesthesia Transfer of Care Note  Patient: Jasmine Howard  Procedure(s) Performed: Procedure(s) with comments: Repeat CESAREAN SECTION (N/A) - EDD: 09/07/12 SCAR REVISION - cesarean section scar revision  Patient Location: PACU  Anesthesia Type:Spinal  Level of Consciousness: awake, alert  and oriented  Airway & Oxygen Therapy: Patient Spontanous Breathing  Post-op Assessment: Report given to PACU RN and Post -op Vital signs reviewed and stable  Post vital signs: stable  Complications: No apparent anesthesia complications

## 2012-09-01 NOTE — Anesthesia Postprocedure Evaluation (Signed)
  Anesthesia Post-op Note  Anesthesia Post Note  Patient: Jasmine Howard  Procedure(s) Performed: Procedure(s) (LRB): Repeat CESAREAN SECTION (N/A) SCAR REVISION  Anesthesia type: Spinal  Patient location: PACU  Post pain: Pain level controlled  Post assessment: Post-op Vital signs reviewed  Last Vitals:  Filed Vitals:   09/01/12 1700  BP: 107/64  Pulse: 63  Temp: 36.4 C  Resp: 12    Post vital signs: Reviewed  Level of consciousness: awake  Complications: No apparent anesthesia complications

## 2012-09-01 NOTE — Anesthesia Procedure Notes (Signed)
Spinal  Patient location during procedure: OR Start time: 09/01/2012 2:22 PM Staffing Performed by: anesthesiologist  Preanesthetic Checklist Completed: patient identified, site marked, surgical consent, pre-op evaluation, timeout performed, IV checked, risks and benefits discussed and monitors and equipment checked Spinal Block Patient position: sitting Prep: site prepped and draped and DuraPrep Patient monitoring: heart rate, cardiac monitor, continuous pulse ox and blood pressure Approach: midline Location: L3-4 Injection technique: single-shot Needle Needle type: Sprotte  Needle gauge: 24 G Needle length: 9 cm Assessment Sensory level: T4 Additional Notes Clear free flow CSF on first attempt. No paresthesia.  Patient tolerated procedure well with no apparent complications.  Mervyn Skeeters Cassidy< MD

## 2012-09-01 NOTE — Anesthesia Preprocedure Evaluation (Signed)
Anesthesia Evaluation  Patient identified by MRN, date of birth, ID band Patient awake    Reviewed: Allergy & Precautions, H&P , NPO status , Patient's Chart, lab work & pertinent test results, reviewed documented beta blocker date and time   History of Anesthesia Complications Negative for: history of anesthetic complications  Airway Mallampati: I TM Distance: >3 FB Neck ROM: full    Dental no notable dental hx. (+) Teeth Intact   Pulmonary neg pulmonary ROS,  breath sounds clear to auscultation  Pulmonary exam normal       Cardiovascular Exercise Tolerance: Good negative cardio ROS  Rhythm:regular Rate:Normal     Neuro/Psych PSYCHIATRIC DISORDERS (depression - no meds) negative neurological ROS     GI/Hepatic Fatty liver disease Irritable bowel syndrome   Endo/Other  diabetes, Type 2, Oral Hypoglycemic AgentsHypothyroidism Morbid obesityPCOS  Renal/GU negative Renal ROS  negative genitourinary   Musculoskeletal   Abdominal Normal abdominal exam  (+)   Peds  Hematology   Anesthesia Other Findings   Reproductive/Obstetrics (+) Pregnancy (h/o c/s x1, cerclage this pregnancy, repeat c/s today)                           Anesthesia Physical  Anesthesia Plan  ASA: III  Anesthesia Plan: Spinal   Post-op Pain Management:    Induction:   Airway Management Planned:   Additional Equipment:   Intra-op Plan:   Post-operative Plan:   Informed Consent: I have reviewed the patients History and Physical, chart, labs and discussed the procedure including the risks, benefits and alternatives for the proposed anesthesia with the patient or authorized representative who has indicated his/her understanding and acceptance.     Plan Discussed with: Anesthesiologist, CRNA and Surgeon  Anesthesia Plan Comments:         Anesthesia Quick Evaluation

## 2012-09-01 NOTE — Brief Op Note (Signed)
09/01/2012  3:45 PM  PATIENT:  Jasmine Howard  37 y.o. female  PRE-OPERATIVE DIAGNOSIS:  Previous Cesarean Section, keloid pfanensteil scar  POST-OPERATIVE DIAGNOSIS:  Previous Cesarean Section, keloid pfanensteil scar  PROCEDURE:  Procedure(s) with comments: Repeat CESAREAN SECTION (N/A) - EDD: 09/07/12 SCAR REVISION - cesarean section scar revision with Kenalog injection  SURGEON:  Surgeon(s) and Role:    * Emani Morad A. Ernestina Penna, MD - Primary  PHYSICIAN ASSISTANT:   ASSISTANTS: none   ANESTHESIA:   spinal  EBL:  Total I/O In: 3300 [I.V.:3300] Out: 1100 [Urine:200; Blood:900]  BLOOD ADMINISTERED:none  DRAINS: Urinary Catheter (Foley)   LOCAL MEDICATIONS USED:  OTHER kenalog injection  SPECIMEN:  Source of Specimen:  placenta  DISPOSITION OF SPECIMEN:  L&D  COUNTS:  YES  TOURNIQUET:  * No tourniquets in log *  DICTATION: .Note written in EPIC  PLAN OF CARE: Admit to inpatient   PATIENT DISPOSITION:  PACU - hemodynamically stable.   Delay start of Pharmacological VTE agent (>24hrs) due to surgical blood loss or risk of bleeding: yes

## 2012-09-02 ENCOUNTER — Encounter (HOSPITAL_COMMUNITY): Payer: Self-pay | Admitting: *Deleted

## 2012-09-02 LAB — CBC
MCV: 84.1 fL (ref 78.0–100.0)
WBC: 9.5 10*3/uL (ref 4.0–10.5)

## 2012-09-02 LAB — GLUCOSE, CAPILLARY
Glucose-Capillary: 119 mg/dL — ABNORMAL HIGH (ref 70–99)
Glucose-Capillary: 124 mg/dL — ABNORMAL HIGH (ref 70–99)

## 2012-09-02 LAB — RPR: RPR Ser Ql: NONREACTIVE

## 2012-09-02 MED ORDER — PHENAZOPYRIDINE HCL 100 MG PO TABS
200.0000 mg | ORAL_TABLET | Freq: Three times a day (TID) | ORAL | Status: DC
  Administered 2012-09-02 – 2012-09-04 (×5): 200 mg via ORAL
  Filled 2012-09-02: qty 2
  Filled 2012-09-02: qty 1
  Filled 2012-09-02: qty 2
  Filled 2012-09-02: qty 1
  Filled 2012-09-02: qty 2
  Filled 2012-09-02: qty 1

## 2012-09-02 NOTE — Progress Notes (Addendum)
POD # 1  Subjective: Pt reports feeling good, little sore/ Pain controlled with Motrin and Percocet Tolerating po / No n/v/Flatus absent Activity: up with assistance Bleeding is light Newborn info:  Information for the patient's newborn:  Anakaren, Campion [[id number]]  female Feeding: breast   Objective: VS: Temp:  [97.2 F (36.2 C)-98.5 F (36.9 C)] 98.1 F (36.7 C) (08/09 1100) Pulse Rate:  [63-82] 69 (08/09 1100) Resp:  [12-20] 18 (08/09 1100) BP: (97-136)/(47-76) 101/64 mmHg (08/09 1100) SpO2:  [97 %-100 %] 98 % (08/09 1100) Weight:  [94.348 kg (208 lb)] 94.348 kg (208 lb) (08/08 1743)  I&O: Intake/Output     08/08 0701 - 08/09 0700 08/09 0701 - 08/10 0700   P.O. 660    I.V. (mL/kg) 3800 (40.3)    Other 720 720   Total Intake(mL/kg) 5180 (54.9) 720 (7.6)   Urine (mL/kg/hr) 1725 925 (2.1)   Blood 900    Total Output 2625 925   Net +2555 -205         Foley:clear, amber, adequate   Recent Labs  09/01/12 1300 09/02/12 0613  WBC 9.5 9.5  HGB 12.9 10.0*  HCT 37.7 29.0*  PLT 177 146*    Blood type: --/--/B POS (08/08 1300) Rubella:   Immune   Physical Exam:  General: alert and cooperative CV: Regular rate and rhythm Resp: clear Abdomen: soft, nontender, normal bowel sounds Incision: healing well, no drainage, no erythema, no swelling, well approximated, honeycomb dressing c/d/i Uterine Fundus: firm, below umbilicus, nontender Lochia: minimal Ext: edema trace to bilateral lower extremeties and Homans sign is negative, no sign of DVT    Assessment: POD # 1/ G3P2102 S/P C/Section d/t repeat, LGA Hypothyroidism PCOS Doing well  Plan: Ambulate Continue routine post op orders Continue foley catheter until day 3 for h/o urinary retention Continue Metformin; monitor FBS & 2 hr postprandials Continue Synthroid   Signed: Donette Larry, N, MSN, CNM 09/02/2012, 11:47 AM

## 2012-09-02 NOTE — Anesthesia Postprocedure Evaluation (Signed)
  Anesthesia Post-op Note  Patient: Jasmine Howard  Procedure(s) Performed: Procedure(s) with comments: Repeat CESAREAN SECTION (N/A) - EDD: 09/07/12 SCAR REVISION - cesarean section scar revision  Patient Location: PACU and Mother/Baby  Anesthesia Type:Spinal  Level of Consciousness: awake, alert  and oriented  Airway and Oxygen Therapy: Patient Spontanous Breathing  Post-op Pain: mild  Post-op Assessment: Patient's Cardiovascular Status Stable, Respiratory Function Stable, No signs of Nausea or vomiting, Adequate PO intake, Pain level controlled, No headache, No backache, No residual numbness and No residual motor weakness  Post-op Vital Signs: stable  Complications: No apparent anesthesia complications

## 2012-09-02 NOTE — Clinical Social Work Note (Signed)
CSW spoke with MOB.  MOB reports currently receiving counseling services for depression/anxiety.  MOB reports no current emotional concerns around separation from her spouse.  Spouse continues to be involved and supportive, no safety concerns.    Patient was referred for history of depression/anxiety.   * Referral screened out by Clinical Social Worker because none of the following criteria appear to apply: ~ History of anxiety/depression during this pregnancy, or of post-partum depression. ~ Diagnosis of anxiety and/or depression within last 3 years ~ History of depression due to pregnancy loss/loss of child  OR  * Patient's symptoms currently being treated with medication and/or therapy.  Please contact the Clinical Social Worker if needs arise, or by the patient's request. [phone]

## 2012-09-02 NOTE — Plan of Care (Signed)
Problem: Phase I Progression Outcomes Goal: Voiding adequately Outcome: Not Applicable Date Met:  09/02/12 Foley catheter to remain in through POD #2

## 2012-09-03 LAB — GLUCOSE, CAPILLARY
Glucose-Capillary: 90 mg/dL (ref 70–99)
Glucose-Capillary: 101 mg/dL — ABNORMAL HIGH (ref 70–99)
Glucose-Capillary: 94 mg/dL (ref 70–99)

## 2012-09-03 NOTE — Progress Notes (Signed)
POD # 2  Subjective: Pt reports feeling well/ Pain controlled with Motrin and Percocet Tolerating po/Voiding without problems/ No n/v/Flatus present Activity: ad lib Bleeding is light Newborn info:  Information for the patient's newborn:  Jasmine, Howard [[id number]]  female  / Circumcision: no / Feeding: bottle   Objective: VS: Temp:  [97.9 F (36.6 C)-98.4 F (36.9 C)] 97.9 F (36.6 C) (08/10 0523) Pulse Rate:  [62-71] 62 (08/10 0523) Resp:  [18] 18 (08/10 0523) BP: (104-110)/(48-68) 110/48 mmHg (08/10 0523) SpO2:  [96 %-98 %] 96 % (08/10 0523)  I&O: Intake/Output     08/09 0701 - 08/10 0700 08/10 0701 - 08/11 0700   P.O. 1200    I.V. (mL/kg)     Other 2320 840   Total Intake(mL/kg) 3520 (37.3) 840 (8.9)   Urine (mL/kg/hr) 2025 (0.9) 225 (0.4)   Blood     Total Output 2025 225   Net +1495 +615          LABS:  Recent Labs  09/01/12 1300 09/02/12 0613  WBC 9.5 9.5  HGB 12.9 10.0*  HCT 37.7 29.0*  PLT 177 146*   Fasting and postprandial BS: normal range  Blood type: --/--/B POS (08/08 1300) Rubella:   Immune     Physical Exam:  General: alert and cooperative CV: Regular rate and rhythm Resp: clear Abdomen: soft, nontender, normal bowel sounds Uterine Fundus: firm, below umbilicus, nontender Incision: Covered with Tegaderm and honeycomb dressing; well approximated. Lochia: minimal Ext: edema trace bilateral lower extremeties and Homans sign is negative, no sign of DVT GU: Foley to SD, adequate, orange-tinged    Assessment/: POD # 2/ G3P2102/ S/P C/Section d/t repeat and LGA PCOS; blood glucose stable Hypothothyroidism Doing well  Plan: Continue routine post op orders Discontinue foley tomorrow Anticipate discharge home in the am Niferex for ABL anemia   Signed: Donette Larry, N, MSN, CNM 09/03/2012, 12:28 PM

## 2012-09-04 ENCOUNTER — Encounter (HOSPITAL_COMMUNITY): Payer: Self-pay | Admitting: Obstetrics

## 2012-09-04 MED ORDER — OXYCODONE-ACETAMINOPHEN 5-325 MG PO TABS: 1.0000 | ORAL_TABLET | ORAL | Status: DC | PRN

## 2012-09-04 MED ORDER — IBUPROFEN 600 MG PO TABS: 600.0000 mg | ORAL_TABLET | Freq: Four times a day (QID) | ORAL | Status: DC

## 2012-09-04 NOTE — Discharge Summary (Signed)
POSTOPERATIVE DISCHARGE SUMMARY:  Patient ID: Jasmine Howard MRN: [id number] DOB/AGE: Apr 26, 1975 37 y.o.  Admit date: 09/01/2012 Admission Diagnoses: onset of labor - previous CS for repeat / keloid scar at previous CS site / PCOS with glucose intolerance / GDM / hypothyroidism / obesity  Discharge date:   Discharge Diagnoses: POD 3 CS - incision revision / PCOS with glucose intolerance / hypothyroidism  Prenatal history: [id number]   EDC : 09/07/2012, Alternate EDD Entry  P[facility] at [facility]  Primary provider : Ernestina Penna Prenatal course complicated by Obesity / PCOS - glucose intolerance / fatty liver / hypothyroidism / cervical incompetence with cerclage/ GDM / polyhydramnios / LGA  Prenatal Labs: ABO, Rh: --/--/B POS (08/08 1300)  Antibody: NEG (08/08 1300) Rubella:  immune RPR: NON REACTIVE (08/08 1300)  HBsAg:   neg HIV: Non-reactive (12/31 1530)  GTT : ABNORMAL GBS:   negative  Medical / Surgical History :  Past medical history:  Past Medical History  Diagnosis Date  . BENIGN POSITIONAL VERTIGO   . DEPRESSION   . FATTY LIVER DISEASE   . HYPOTHYROIDISM   . Irritable bowel syndrome   . IRITIS   . Polycystic ovaries   . Infertility, female   . Cervical incompetence     s/p cerclage 03/01/12  . Postpartum care following cesarean delivery (breech 11/16) 12/12/2010  . DIABETES MELLITUS, TYPE II     metformin  . ANEMIA-NOS     Past surgical history:  Past Surgical History  Procedure Laterality Date  . Cholecystectomy  2009  . Cesarean section  12/11/2010    Procedure: CESAREAN SECTION;  Surgeon: Tresa Endo A. Fogleman;  Location: [facility];  Service: Gynecology;  Laterality: N/A;  . Cervical cerclage N/A 03/03/2012    Procedure: CERCLAGE CERVICAL;  Surgeon: Tresa Endo A. Ernestina Penna, MD;  Location: [facility];  Service: Gynecology;  Laterality: N/A;  EDD:  09/07/12    Family History:  Family History  Problem Relation Age of Onset  . Arthritis Other     Parent   . Alcohol abuse Other     Parent    Social History:  reports that she has never smoked. She has never used smokeless tobacco. She reports that she does not drink alcohol or use illicit drugs.  Allergies: Review of patient's allergies indicates no known allergies.   Current Medications at time of admission:  Prior to Admission medications   Medication Sig Start Date End Date Taking? Authorizing Provider  levothyroxine (SYNTHROID, LEVOTHROID) 50 MCG tablet Take 50 mcg by mouth daily.     Yes Historical Provider, MD  metFORMIN (GLUCOPHAGE-XR) 500 MG 24 hr tablet Take 500 mg by mouth 2 (two) times daily with a meal.   Yes Historical Provider, MD  ibuprofen (ADVIL,MOTRIN) 600 MG tablet Take 1 tablet (600 mg total) by mouth every 6 (six) hours. 09/04/12   Marlinda Mike, CNM                   Intrapartum Course:  Admit for onset labor - desire for repeat CS   Procedures: Cesarean section delivery on 09-01-2012 with delivery of  female newborn by Dr Ernestina Penna   See operative report for further details APGAR (1 MIN): 9   APGAR (5 MINS): 9    Postoperative / postpartum course:  Uncomplicated with discharge on POD 3 - no urinary retention this postop course  Physical Exam:   VSS: Temp:  [97.7 F (36.5 C)-98.2 F (36.8 C)] 97.7 F (36.5  C) (08/11 0553) Pulse Rate:  [70-85] 70 (08/11 0553) Resp:  [18] 18 (08/11 0553) BP: (107-110)/(60-71) 110/71 mmHg (08/11 0553)  LABS:  Recent Labs  09/01/12 1300 09/02/12 0613  WBC 9.5 9.5  HGB 12.9 10.0*  PLT 177 146*    General: pleasant / NAD / ambulatory Heart: RRR Lungs: clear  Abdomen: soft and non-tender / non-distended / active BS  Extremities: no edema / negative Homans  Dressing: intact honeycomb dressing Incision:  approximated with subcuticular / no erythema / no ecchymosis / no drainage  Discharge Instructions:  Discharged Condition: stable  Activity: pelvic rest and postoperative restrictions x 2   Diet: carb  modified  Medications:    Medication List         ibuprofen 600 MG tablet  Commonly known as:  ADVIL,MOTRIN  Take 1 tablet (600 mg total) by mouth every 6 (six) hours.     levothyroxine 50 MCG tablet  Commonly known as:  SYNTHROID, LEVOTHROID  Take 50 mcg by mouth daily.     metFORMIN 500 MG 24 hr tablet  Commonly known as:  GLUCOPHAGE-XR  Take 500 mg by mouth 2 (two) times daily with a meal.     oxyCODONE-acetaminophen 5-325 MG per tablet  Commonly known as:  PERCOCET/ROXICET  Take 1-2 tablets by mouth every 4 (four) hours as needed.     prenatal multivitamin Tabs tablet  Take 1 tablet by mouth daily.        Wound Care: keep clean and dry / remove honeycomb POD 5 Postpartum Instructions: [facility] discharge booklet - instructions reviewed  Discharge to: Home  Follow up :   [facility] in 6 weeks for routine postpartum visit with Ernestina Penna                Signed: Marlinda Mike CNM, MSN, [facility] 09/04/2012, 9:40 AM

## 2012-09-04 NOTE — Progress Notes (Signed)
POSTOPERATIVE DAY # 3 S/P repeat CS with scar revision   S:         Reports feeling well             Tolerating po intake / no nausea / no vomiting / + flatus / no BM             Bleeding is light             Pain controlled withmotrin and percocet             Up ad lib / ambulatory/ voiding QS since foley out this am  Newborn breast feeding    O:  VS: BP 110/71  Pulse 70  Temp(Src) 97.7 F (36.5 C) (Axillary)  Resp 18  Wt 94.348 kg (208 lb)  BMI 38.03 kg/m2  SpO2 96%  LMP 10/29/2011   LABS:               Recent Labs  09/01/12 1300 09/02/12 0613  WBC 9.5 9.5  HGB 12.9 10.0*  PLT 177 146*               Bloodtype: --/--/B POS (08/08 1300)  Rubella:      Immune                             Physical Exam:             Alert and Oriented X3  Lungs: Clear and unlabored  Heart: regular rate and rhythm / no mumurs  Abdomen: soft, non-tender, non-distended active BS             Fundus: firm, non-tender, U-1             Dressing intact honeycomb              Incision:  approximated with subcuticular / no erythema / no ecchymosis / no drainage  Perineum: no edema  Lochia: light  Extremities: trace edema, no calf pain or tenderness, neg Homans  A:        POD # 3 S/P CS- repeat with scar revision            HX keloid - injected with Kenolog at incision site            Anemia - on iron  P:        Routine postoperative care              DC home             Remove dressing POD 5 at home             Continue iron x 6 weeks - repeat CBC at postpartum visit    Marlinda Mike CNM, MSN, [facility] 09/04/2012, 8:55 AM

## 2012-09-24 ENCOUNTER — Inpatient Hospital Stay (HOSPITAL_COMMUNITY)
Admission: AD | Admit: 2012-09-24 | Discharge: 2012-09-24 | Disposition: A | Discharge status: Home / Self Care | Payer: BC Managed Care – PPO | Source: Ambulatory Visit | Attending: Obstetrics & Gynecology | Admitting: Obstetrics & Gynecology

## 2012-09-24 ENCOUNTER — Encounter (HOSPITAL_COMMUNITY): Payer: Self-pay | Admitting: *Deleted

## 2012-09-24 DIAGNOSIS — T8131XS Disruption of external operation (surgical) wound, not elsewhere classified, sequela: Secondary | ICD-10-CM

## 2012-09-24 DIAGNOSIS — O909 Complication of the puerperium, unspecified: Secondary | ICD-10-CM | POA: Insufficient documentation

## 2012-09-24 NOTE — MAU Provider Note (Signed)
History     CSN: [id number]  Arrival date and time: 09/24/12 1217   None      HPI G2P2002, post-op repeat cesarean section on 09/01/12. Noticed small area on rt side of incision open and draining white fluid. Has had some pulling and burning at incision since surgery.   Past Medical History  Diagnosis Date  . BENIGN POSITIONAL VERTIGO   . DEPRESSION   . FATTY LIVER DISEASE   . HYPOTHYROIDISM   . Irritable bowel syndrome   . IRITIS   . Polycystic ovaries   . Infertility, female   . Cervical incompetence     s/p cerclage 03/01/12  . Postpartum care following cesarean delivery (breech 11/16) 12/12/2010  . DIABETES MELLITUS, TYPE II     metformin  . ANEMIA-NOS     Past Surgical History  Procedure Laterality Date  . Cholecystectomy  2009  . Cesarean section  12/11/2010    Procedure: CESAREAN SECTION;  Surgeon: Tresa Endo A. Fogleman;  Location: [facility];  Service: Gynecology;  Laterality: N/A;  . Cervical cerclage N/A 03/03/2012    Procedure: CERCLAGE CERVICAL;  Surgeon: Tresa Endo A. Ernestina Penna, MD;  Location: [facility];  Service: Gynecology;  Laterality: N/A;  EDD:  09/07/12  . Cesarean section N/A 09/01/2012    Procedure: Repeat CESAREAN SECTION;  Surgeon: Tresa Endo A. Ernestina Penna, MD;  Location: [facility];  Service: Obstetrics;  Laterality: N/A;  EDD: 09/07/12  . Scar revision  09/01/2012    Procedure: SCAR REVISION;  Surgeon: Tresa Endo A. Ernestina Penna, MD;  Location: [facility];  Service: Obstetrics;;  cesarean section scar revision    Family History  Problem Relation Age of Onset  . Arthritis Other     Parent  . Alcohol abuse Other     Parent    History  Substance Use Topics  . Smoking status: Never Smoker   . Smokeless tobacco: Never Used     Comment: Married, lives with spouse and his son. 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 Alcohol Use: No    Allergies: No Known Allergies  Prescriptions prior to admission  Medication Sig Dispense Refill  . ibuprofen (ADVIL,MOTRIN) 600 MG tablet Take  1 tablet (600 mg total) by mouth every 6 (six) hours.  30 tablet  0  . levothyroxine (SYNTHROID, LEVOTHROID) 50 MCG tablet Take 50 mcg by mouth daily.        . metFORMIN (GLUCOPHAGE-XR) 500 MG 24 hr tablet Take 500 mg by mouth 2 (two) times daily with a meal.      . oxyCODONE-acetaminophen (PERCOCET/ROXICET) 5-325 MG per tablet Take 1-2 tablets by mouth every 4 (four) hours as needed.  30 tablet  0  . Prenatal Vit-Fe Fumarate-FA (PRENATAL MULTIVITAMIN) TABS Take 1 tablet by mouth daily.        ROS No fever or chills. No nausea or vomiting. No malodorous drainage. Incision without edema, redness, or warmth to touch. Physical Exam   Blood pressure 118/72, pulse 85, temperature 97.3 F (36.3 C), resp. rate 20, unknown if currently breastfeeding.  Physical Exam  Alert and oriented x3 Heart: RRR Lungs: CTA bilaterally GI: soft, non-tender, bs x4 AAbdomen: low transverse abdominal incision clean, dry, & well approximated except small 6mm right distal skin area; no tracking, scant serous drainage, no erythema or edema [id number] Extremeties: no edema, Homans negative  MAU Course  Procedures   Assessment and Plan  Wound disruption of skin layer   Keep clean and dry, open to air unless active then may  cover with gauze and neosporin Peroxide to wound every 2 days Anticipate spontaneous resolution in 1-2 weeks Good nutrition, hydration, and rest to promote healing Notify MD for increased drainage, increased pain, further extension, fever or chills Follow-up in 3 weeks at [facility] or sooner if worsening  Tor Tsuda, N 09/24/2012, 1:01 PM

## 2012-09-24 NOTE — MAU Note (Signed)
Pt presents with complaints of having some white drainage at her cesarean section scar. Cesarean section on August the 8th. Denies any fever. She says that when she walks it feels like the incision site is pulling.

## 2012-10-30 LAB — HM DIABETES EYE EXAM

## 2012-11-20 ENCOUNTER — Ambulatory Visit (INDEPENDENT_AMBULATORY_CARE_PROVIDER_SITE_OTHER): Payer: BC Managed Care – PPO | Admitting: Internal Medicine

## 2012-11-20 ENCOUNTER — Encounter: Payer: Self-pay | Admitting: Internal Medicine

## 2012-11-20 ENCOUNTER — Other Ambulatory Visit (INDEPENDENT_AMBULATORY_CARE_PROVIDER_SITE_OTHER): Payer: BC Managed Care – PPO

## 2012-11-20 VITALS — BP 112/72 | HR 78 | Temp 97.3°F | Wt 193.4 lb

## 2012-11-20 DIAGNOSIS — E119 Type 2 diabetes mellitus without complications: Secondary | ICD-10-CM

## 2012-11-20 DIAGNOSIS — R319 Hematuria, unspecified: Secondary | ICD-10-CM

## 2012-11-20 DIAGNOSIS — Z0000 Encounter for general adult medical examination without abnormal findings: Secondary | ICD-10-CM

## 2012-11-20 DIAGNOSIS — E039 Hypothyroidism, unspecified: Secondary | ICD-10-CM

## 2012-11-20 LAB — CBC WITH DIFFERENTIAL/PLATELET
Eosinophils Relative: 1.3 % (ref 0.0–5.0)
HCT: 34.2 % — ABNORMAL LOW (ref 36.0–46.0)
Hemoglobin: 11.6 g/dL — ABNORMAL LOW (ref 12.0–15.0)
Lymphs Abs: 1.6 10*3/uL (ref 0.7–4.0)
Monocytes Relative: 7.4 % (ref 3.0–12.0)
Neutro Abs: 6.7 10*3/uL (ref 1.4–7.7)
RBC: 4.15 Mil/uL (ref 3.87–5.11)
WBC: 9.1 10*3/uL (ref 4.5–10.5)

## 2012-11-20 LAB — POCT URINALYSIS DIPSTICK
Ketones, UA: NEGATIVE
Protein, UA: NEGATIVE
Spec Grav, UA: <=1.005

## 2012-11-20 LAB — LIPID PANEL
Cholesterol: 154 mg/dL (ref 0–200)
HDL: 48.1 mg/dL (ref >39.00)
VLDL: 28 mg/dL (ref 0.0–40.0)

## 2012-11-20 LAB — MICROALBUMIN / CREATININE URINE RATIO: Microalb Creat Ratio: 29.3 mg/g (ref 0.0–30.0)

## 2012-11-20 LAB — HEPATIC FUNCTION PANEL
ALT: 34 U/L (ref 0–35)
Total Protein: 7.7 g/dL (ref 6.0–8.3)

## 2012-11-20 LAB — TSH: TSH: 1.2 u[IU]/mL (ref 0.35–5.50)

## 2012-11-20 LAB — BASIC METABOLIC PANEL
GFR: 120.97 mL/min (ref >60.00)
Potassium: 4 mEq/L (ref 3.5–5.1)
Sodium: 139 mEq/L (ref 135–145)

## 2012-11-20 LAB — HEMOGLOBIN A1C: Hgb A1c MFr Bld: 5.3 % (ref 4.6–6.5)

## 2012-11-20 MED ORDER — FLUCONAZOLE 150 MG PO TABS: 150.0000 mg | ORAL_TABLET | Freq: Once | ORAL | Status: DC

## 2012-11-20 MED ORDER — CIPROFLOXACIN HCL 500 MG PO TABS: 500.0000 mg | ORAL_TABLET | Freq: Two times a day (BID) | ORAL | Status: DC

## 2012-11-20 NOTE — Patient Instructions (Addendum)
It was good to see you today.  We have reviewed your prior records including labs and tests today  Health Maintenance reviewed - all recommended immunizations and age-appropriate screenings are up-to-date.  Test(s) ordered today. Your results will be released to MyChart (or called to you) after review, usually within 72hours after test completion. If any changes need to be made, you will be notified at that same time.  Medications reviewed and updated, take Cipro antibiotics twice daily for bladder infection and Diflucan as directed for yeast symptoms -no other changes recommended at this time.  Your prescription(s) have been submitted to your pharmacy. Please take as directed and contact our office if you believe you are having problem(s) with the medication(s).  Please schedule followup in 6 months to monitor diabetes and thyroid status, call sooner if problems.    Health Maintenance, Females A healthy lifestyle and preventative care can promote health and wellness.  Maintain regular health, dental, and eye exams.  Eat a healthy diet. Foods like vegetables, fruits, whole grains, low-fat dairy products, and lean protein foods contain the nutrients you need without too many calories. Decrease your intake of foods high in solid fats, added sugars, and salt. Get information about a proper diet from your caregiver, if necessary.  Regular physical exercise is one of the most important things you can do for your health. Most adults should get at least 150 minutes of moderate-intensity exercise (any activity that increases your heart rate and causes you to sweat) each week. In addition, most adults need muscle-strengthening exercises on 2 or more days a week.   Maintain a healthy weight. The body mass index (BMI) is a screening tool to identify possible weight problems. It provides an estimate of body fat based on height and weight. Your caregiver can help determine your BMI, and can help you  achieve or maintain a healthy weight. For adults 20 years and older:  A BMI below 18.5 is considered underweight.  A BMI of 18.5 to 24.9 is normal.  A BMI of 25 to 29.9 is considered overweight.  A BMI of 30 and above is considered obese.  Maintain normal blood lipids and cholesterol by exercising and minimizing your intake of saturated fat. Eat a balanced diet with plenty of fruits and vegetables. Blood tests for lipids and cholesterol should begin at age 37 and be repeated every 5 years. If your lipid or cholesterol levels are high, you are over 50, or you are a high risk for heart disease, you may need your cholesterol levels checked more frequently.Ongoing high lipid and cholesterol levels should be treated with medicines if diet and exercise are not effective.  If you smoke, find out from your caregiver how to quit. If you do not use tobacco, do not start.  If you are pregnant, do not drink alcohol. If you are breastfeeding, be very cautious about drinking alcohol. If you are not pregnant and choose to drink alcohol, do not exceed 1 drink per day. One drink is considered to be 12 ounces (355 mL) of beer, 5 ounces (148 mL) of wine, or 1.5 ounces (44 mL) of liquor.  Avoid use of street drugs. Do not share needles with anyone. Ask for help if you need support or instructions about stopping the use of drugs.  High blood pressure causes heart disease and increases the risk of stroke. Blood pressure should be checked at least every 1 to 2 years. Ongoing high blood pressure should be treated with medicines, if  weight loss and exercise are not effective.  If you are 25 to 38 years old, ask your caregiver if you should take aspirin to prevent strokes.  Diabetes screening involves taking a blood sample to check your fasting blood sugar level. This should be done once every 3 years, after age 78, if you are within normal weight and without risk factors for diabetes. Testing should be considered at a  younger age or be carried out more frequently if you are overweight and have at least 1 risk factor for diabetes.  Breast cancer screening is essential preventative care for women. You should practice "breast self-awareness." This means understanding the normal appearance and feel of your breasts and may include breast self-examination. Any changes detected, no matter how small, should be reported to a caregiver. Women in their 41s and 30s should have a clinical breast exam (CBE) by a caregiver as part of a regular health exam every 1 to 3 years. After age 67, women should have a CBE every year. Starting at age 25, women should consider having a mammogram (breast X-ray) every year. Women who have a family history of breast cancer should talk to their caregiver about genetic screening. Women at a high risk of breast cancer should talk to their caregiver about having an MRI and a mammogram every year.  The Pap test is a screening test for cervical cancer. Women should have a Pap test starting at age 51. Between ages 60 and 24, Pap tests should be repeated every 2 years. Beginning at age 66, you should have a Pap test every 3 years as long as the past 3 Pap tests have been normal. If you had a hysterectomy for a problem that was not cancer or a condition that could lead to cancer, then you no longer need Pap tests. If you are between ages 58 and 3, and you have had normal Pap tests going back 10 years, you no longer need Pap tests. If you have had past treatment for cervical cancer or a condition that could lead to cancer, you need Pap tests and screening for cancer for at least 20 years after your treatment. If Pap tests have been discontinued, risk factors (such as a new sexual partner) need to be reassessed to determine if screening should be resumed. Some women have medical problems that increase the chance of getting cervical cancer. In these cases, your caregiver may recommend more frequent screening and Pap  tests.  The human papillomavirus (HPV) test is an additional test that may be used for cervical cancer screening. The HPV test looks for the virus that can cause the cell changes on the cervix. The cells collected during the Pap test can be tested for HPV. The HPV test could be used to screen women aged 61 years and older, and should be used in women of any age who have unclear Pap test results. After the age of 26, women should have HPV testing at the same frequency as a Pap test.  Colorectal cancer can be detected and often prevented. Most routine colorectal cancer screening begins at the age of 22 and continues through age 72. However, your caregiver may recommend screening at an earlier age if you have risk factors for colon cancer. On a yearly basis, your caregiver may provide home test kits to check for hidden blood in the stool. Use of a small camera at the end of a tube, to directly examine the colon (sigmoidoscopy or colonoscopy), can detect the  earliest forms of colorectal cancer. Talk to your caregiver about this at age 63, when routine screening begins. Direct examination of the colon should be repeated every 5 to 10 years through age 31, unless early forms of pre-cancerous polyps or small growths are found.  Hepatitis C blood testing is recommended for all people born from [birth year] through 1966 and any individual with known risks for hepatitis C.  Practice safe sex. Use condoms and avoid high-risk sexual practices to reduce the spread of sexually transmitted infections (STIs). Sexually active women aged 15 and younger should be checked for Chlamydia, which is a common sexually transmitted infection. Older women with new or multiple partners should also be tested for Chlamydia. Testing for other STIs is recommended if you are sexually active and at increased risk.  Osteoporosis is a disease in which the bones lose minerals and strength with aging. This can result in serious bone fractures. The risk  of osteoporosis can be identified using a bone density scan. Women ages 81 and over and women at risk for fractures or osteoporosis should discuss screening with their caregivers. Ask your caregiver whether you should be taking a calcium supplement or vitamin D to reduce the rate of osteoporosis.  Menopause can be associated with physical symptoms and risks. Hormone replacement therapy is available to decrease symptoms and risks. You should talk to your caregiver about whether hormone replacement therapy is right for you.  Use sunscreen with a sun protection factor (SPF) of 30 or greater. Apply sunscreen liberally and repeatedly throughout the day. You should seek shade when your shadow is shorter than you. Protect yourself by wearing long sleeves, pants, a wide-brimmed hat, and sunglasses year round, whenever you are outdoors.  Notify your caregiver of new moles or changes in moles, especially if there is a change in shape or color. Also notify your caregiver if a mole is larger than the size of a pencil eraser.  Stay current with your immunizations. Document Released: 07/27/2010 Document Revised: 04/05/2011 Document Reviewed: 07/27/2010 [facility] Patient Information 2014 [place], [place].

## 2012-11-20 NOTE — Assessment & Plan Note (Signed)
On replacement Check TSH, adjust as needed Lab Results  Component Value Date   TSH 7.36 04/11/2009

## 2012-11-20 NOTE — Assessment & Plan Note (Signed)
Lab Results  Component Value Date   HGBA1C 5.1 05/25/2011   On metformin Check labs The patient is asked to make an attempt to improve diet and exercise patterns to aid in medical management of this problem.

## 2012-11-20 NOTE — Progress Notes (Signed)
Subjective:    Patient ID: Jasmine Howard, female    DOB: [birth year], 37 y.o.   MRN: [id number]  HPI  patient is here today for annual physical. Patient feels well in general  Patient here today for evaluation of painful hematuria.  Symptoms started yesterday with frequency, pain on urination, chills.  Pt has tried increasing po fluid intake without relief.  Pt with 71 month old newborn son, no longer breastfeeding.   Chronic medical issues also reviewed.   Type II diabetes - pt currently maintained on Metformin.  States fasting blood sugars around 120.  Pt reports compliance with current treatment.  Hypothyroidism - currently on Synthroid for thyroid replacement therapy.  Reports compliance with current therapy.  Denies adverse effects.   Past Medical History  Diagnosis Date  . BENIGN POSITIONAL VERTIGO   . DEPRESSION   . FATTY LIVER DISEASE   . HYPOTHYROIDISM   . Irritable bowel syndrome   . IRITIS   . Polycystic ovaries   . Infertility, female   . Cervical incompetence     s/p cerclage 03/01/12  . Postpartum care following cesarean delivery (breech 11/16) 12/12/2010  . DIABETES MELLITUS, TYPE II     metformin  . ANEMIA-NOS    Family History  Problem Relation Age of Onset  . Arthritis Other     Parent  . Alcohol abuse Other     Parent   History  Substance Use Topics  . Smoking status: Never Smoker   . Smokeless tobacco: Never Used     Comment: Married, lives with spouse and 2 kids. 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 Alcohol Use: No     Review of Systems  Constitutional: Negative for fatigue and unexpected weight change.  Respiratory: Negative for cough, shortness of breath and wheezing.   Cardiovascular: Negative for chest pain, palpitations and leg swelling.  Gastrointestinal: Negative for nausea, abdominal pain and diarrhea.  Genitourinary: Positive for dysuria, urgency, frequency and hematuria. Negative for flank pain, menstrual problem and  pelvic pain.  Neurological: Negative for dizziness, weakness, light-headedness and headaches.  Psychiatric/Behavioral: Negative for dysphoric mood. The patient is not nervous/anxious.   All other systems reviewed and are negative.       Objective:   Physical Exam BP 112/72  Pulse 78  Temp(Src) 97.3 F (36.3 C) (Oral)  Wt 193 lb 6.4 oz (87.726 kg)  BMI 35.36 kg/m2  SpO2 98% Wt Readings from Last 3 Encounters:  11/20/12 193 lb 6.4 oz (87.726 kg)  09/01/12 208 lb (94.348 kg)  09/01/12 208 lb (94.348 kg)   Constitutional: She appears well-developed and well-nourished. No distress.  HENT: Head: Normocephalic and atraumatic. Ears: B TMs ok, no erythema or effusion; Nose: Nose normal. Mouth/Throat: Oropharynx is clear and moist. No oropharyngeal exudate.  Eyes: Conjunctivae and EOM are normal. Pupils are equal, round, and reactive to light. No scleral icterus.  Neck: Normal range of motion. Neck supple. No JVD present. No thyromegaly present.  Cardiovascular: Normal rate, regular rhythm and normal heart sounds.  No murmur heard. No BLE edema. Pulmonary/Chest: Effort normal and breath sounds normal. No respiratory distress. She has no wheezes.  Abdominal: Soft. Bowel sounds are normal. She exhibits no distension. There is no tenderness. no masses Musculoskeletal: Normal range of motion, no joint effusions. No gross deformities Neurological: She is alert and oriented to person, place, and time. No cranial nerve deficit. Coordination, balance, strength, speech and gait are normal.  Skin: Skin is warm and dry.  No rash noted. No erythema.  Psychiatric: She has a normal mood and affect. Her behavior is normal. Judgment and thought content normal.    Lab Results  Component Value Date   WBC 9.5 09/02/2012   HGB 10.0* 09/02/2012   HCT 29.0* 09/02/2012   PLT 146* 09/02/2012   GLUCOSE 79 03/01/2012   CHOL 154 04/11/2009   HDL 41 04/11/2009   LDLCALC 89 04/11/2009   ALT 11 08/25/2010   AST 12 08/25/2010    NA 133* 03/01/2012   K 3.5 03/01/2012   CL 99 03/01/2012   CREATININE 0.45* 03/01/2012   BUN 6 03/01/2012   CO2 23 03/01/2012   TSH 7.36 04/11/2009   HGBA1C 5.1 05/25/2011       Assessment & Plan:   CPX/v70.0 - Patient has been counseled on age-appropriate routine health concerns for screening and prevention. These are reviewed and up-to-date. Immunizations are up-to-date or declined. Labs ordered and reviewed.  Also see problem list  Acute cystitis - UA dipstick today positive for blood and trace leukocytes.  Will rx with Cipro today.  Pt advised to continue to increase fluid intake.  Pt with history of yeast infections on antibiotics, will give rx for Diflucan to take if needed. Pt to call back if symptoms worsen/not improved in 3 days.

## 2012-11-22 LAB — URINE CULTURE

## 2012-11-22 MED ORDER — NITROFURANTOIN MONOHYD MACRO 100 MG PO CAPS: 100.0000 mg | ORAL_CAPSULE | Freq: Two times a day (BID) | ORAL | Status: DC

## 2012-11-22 NOTE — Addendum Note (Signed)
Addended by: Rene Paci A on: 11/22/2012 07:42 AM   Modules accepted: Orders, Medications

## 2013-05-01 ENCOUNTER — Ambulatory Visit (INDEPENDENT_AMBULATORY_CARE_PROVIDER_SITE_OTHER): Payer: BC Managed Care – PPO | Admitting: Family Medicine

## 2013-05-01 ENCOUNTER — Encounter: Payer: Self-pay | Admitting: Family Medicine

## 2013-05-01 VITALS — BP 120/76 | HR 87 | Temp 99.2°F

## 2013-05-01 DIAGNOSIS — J111 Influenza due to unidentified influenza virus with other respiratory manifestations: Secondary | ICD-10-CM

## 2013-05-01 MED ORDER — OSELTAMIVIR PHOSPHATE 75 MG PO CAPS: 75.0000 mg | ORAL_CAPSULE | Freq: Two times a day (BID) | ORAL | Status: DC

## 2013-05-01 NOTE — Progress Notes (Signed)
SUBJECTIVE:  Jasmine Howard is a 38 y.o. female who complains of congestion, sore throat, swollen glands, dry cough, myalgias, fever, chills and positive sick contact of flu of baby sitter for 2 days. She denies a history of chest pain, nausea, shortness of breath, weight loss and wheezing and denies a history of asthma. Patient denies smoke cigarettes.   OBJECTIVE: Blood pressure 120/76, pulse 87, temperature 99.2 F (37.3 C), temperature source Oral, SpO2 98.00%.  She appears ill, vital signs are as noted. Ears normal.  Throat and pharynx moderate erythema.  Neck supple. mildadenopathy in the neck. Skin clammy Nose is congested. Sinuses non tender. The chest is clear, without wheezes or rales.  ASSESSMENT:  influenza  PLAN: Symptomatic therapy suggested: push fluids, rest, gargle warm salt water and return office visit prn if symptoms persist or worsen. Tamiflu given.  Lack of antibiotic effectiveness discussed with her. Call or return to clinic prn if these symptoms worsen or fail to improve as anticipated.

## 2013-05-01 NOTE — Patient Instructions (Signed)
Nice to meet you Try tamiflu twice daily for 10 days Tylenol and ibuprofen as needed  Come back in 48 hours if needed Influenza A (H1N1) H1N1 formerly called "swine flu" is a new influenza virus causing sickness in people. The H1N1 virus is different from seasonal influenza viruses. However, the H1N1 symptoms are similar to seasonal influenza and it is spread from person to person. You may be at higher risk for serious problems if you have underlying serious medical conditions. The CDC and the [employer] Health Organization are following reported cases around the world. CAUSES   The flu is thought to spread mainly person-to-person through coughing or sneezing of infected people.  A person may become infected by touching something with the virus on it and then touching their mouth or nose. SYMPTOMS   Fever.  Headache.  Tiredness.  Cough.  Sore throat.  Runny or stuffy nose.  Body aches.  Diarrhea and vomiting These symptoms are referred to as "flu-like symptoms." A lot of different illnesses, including the common cold, may have similar symptoms. DIAGNOSIS   There are tests that can tell if you have the H1N1 virus.  Confirmed cases of H1N1 will be reported to the state or local health department.  A doctor's exam may be needed to tell whether you have an infection that is a complication of the flu. HOME CARE INSTRUCTIONS   Stay informed. Visit the [facility]CDC website for current recommendations. Visit [employer].tnwww.cdc.gov/H1N1flu/. You may also call 1-800-CDC-INFO (403 639 89871-218 654 2060).  Get help early if you develop any of the above symptoms.  If you are at high risk from complications of the flu, talk to your caregiver as soon as you develop flu-like symptoms. Those at higher risk for complications include:  People 65 years or older.  People with chronic medical conditions.  Pregnant women.  Young children.  Your caregiver may recommend antiviral medicine to help treat the flu.  If you get  the flu, get plenty of rest, drink enough water and fluids to keep your urine clear or pale yellow, and avoid using alcohol or tobacco.  You may take over-the-counter medicine to relieve the symptoms of the flu if your caregiver approves. (Never give aspirin to children or teenagers who have flu-like symptoms, particularly fever). TREATMENT  If you do get sick, antiviral drugs are available. These drugs can make your illness milder and make you feel better faster. Treatment should start soon after illness starts. It is only effective if taken within the first day of becoming ill. Only your caregiver can prescribe antiviral medication.  PREVENTION   Cover your nose and mouth with a tissue or your arm when you cough or sneeze. Throw the tissue away.  Wash your hands often with soap and warm water, especially after you cough or sneeze. Alcohol-based cleaners are also effective against germs.  Avoid touching your eyes, nose or mouth. This is one way germs spread.  Try to avoid contact with sick people. Follow public health advice regarding school closures. Avoid crowds.  Stay home if you get sick. Limit contact with others to keep from infecting them. People infected with the H1N1 virus may be able to infect others anywhere from 1 day before feeling sick to 5-7 days after getting flu symptoms.  An H1N1 vaccine is available to help protect against the virus. In addition to the H1N1 vaccine, you will need to be vaccinated for seasonal influenza. The H1N1 and seasonal vaccines may be given on the same day. The CDC especially  recommends the H1N1 vaccine for:  Pregnant women.  People who live with or care for children younger than 57 months of age.  Health care and emergency services personnel.  Persons between the ages of 85 months through 58 years of age.  People from ages 28 through 3 years who are at higher risk for H1N1 because of chronic health disorders or immune system  problems. FACEMASKS In community and home settings, the use of facemasks and N95 respirators are not normally recommended. In certain circumstances, a facemask or N95 respirator may be used for persons at increased risk of severe illness from influenza. Your caregiver can give additional recommendations for facemask use. IN CHILDREN, EMERGENCY WARNING SIGNS THAT NEED URGENT MEDICAL CARE:  Fast breathing or trouble breathing.  Bluish skin color.  Not drinking enough fluids.  Not waking up or not interacting normally.  Being so fussy that the child does not want to be held.  Your child has an oral temperature above 102 F (38.9 C), not controlled by medicine.  Your baby is older than 3 months with a rectal temperature of 102 F (38.9 C) or higher.  Your baby is 7 months old or younger with a rectal temperature of 100.4 F (38 C) or higher.  Flu-like symptoms improve but then return with fever and worse cough. IN ADULTS, EMERGENCY WARNING SIGNS THAT NEED URGENT MEDICAL CARE:  Difficulty breathing or shortness of breath.  Pain or pressure in the chest or abdomen.  Sudden dizziness.  Confusion.  Severe or persistent vomiting.  Bluish color.  You have a oral temperature above 102 F (38.9 C), not controlled by medicine.  Flu-like symptoms improve but return with fever and worse cough. SEEK IMMEDIATE MEDICAL CARE IF:  You or someone you know is experiencing any of the above symptoms. When you arrive at the emergency center, report that you think you have the flu. You may be asked to wear a mask and/or sit in a secluded area to protect others from getting sick. MAKE SURE YOU:   Understand these instructions.  Will watch your condition.  Will get help right away if you are not doing well or get worse. Some of this information courtesy of the CDC.  Document Released: 06/30/2007 Document Revised: 04/05/2011 Document Reviewed: 06/30/2007 [facility] Patient Information 2014  [place], [place].

## 2013-05-21 ENCOUNTER — Ambulatory Visit (INDEPENDENT_AMBULATORY_CARE_PROVIDER_SITE_OTHER): Payer: BC Managed Care – PPO | Admitting: Internal Medicine

## 2013-05-21 ENCOUNTER — Encounter: Payer: Self-pay | Admitting: Internal Medicine

## 2013-05-21 ENCOUNTER — Other Ambulatory Visit (INDEPENDENT_AMBULATORY_CARE_PROVIDER_SITE_OTHER): Payer: BC Managed Care – PPO

## 2013-05-21 VITALS — BP 112/78 | HR 84 | Temp 98.3°F | Wt 196.8 lb

## 2013-05-21 DIAGNOSIS — E039 Hypothyroidism, unspecified: Secondary | ICD-10-CM

## 2013-05-21 DIAGNOSIS — E119 Type 2 diabetes mellitus without complications: Secondary | ICD-10-CM

## 2013-05-21 DIAGNOSIS — G44209 Tension-type headache, unspecified, not intractable: Secondary | ICD-10-CM

## 2013-05-21 LAB — HEMOGLOBIN A1C: HEMOGLOBIN A1C: 5.4 % (ref 4.6–6.5)

## 2013-05-21 LAB — TSH: TSH: 1.91 u[IU]/mL (ref 0.35–5.50)

## 2013-05-21 MED ORDER — LEVOTHYROXINE SODIUM 50 MCG PO TABS: 50.0000 ug | ORAL_TABLET | Freq: Every day | ORAL | Status: DC

## 2013-05-21 NOTE — Assessment & Plan Note (Addendum)
On replacement -sporadic use in past 2 months because of insurance issues Check TSH, adjust as needed Lab Results  Component Value Date   TSH 1.20 11/20/2012

## 2013-05-21 NOTE — Patient Instructions (Addendum)
It was good to see you today.  We have reviewed your prior records including labs and tests today  Test(s) ordered today. Your results will be released to [place] (or called to you) after review, usually within 72hours after test completion. If any changes need to be made, you will be notified at that same time.  Medications reviewed and updated, no changes recommended at this time.  Please schedule followup in 6 months for annual exam and labs, call sooner if problems.  Diabetes and Standards of Medical Care  Diabetes is complicated. You may find that your diabetes team includes a dietitian, nurse, diabetes educator, eye doctor, and more. To help everyone know what is going on and to help you get the care you deserve, the following schedule of care was developed to help keep you on track. Below are the tests, exams, vaccines, medicines, education, and plans you will need. HbA1c test This test shows how well you have controlled your glucose over the past 2 3 months. It is used to see if your diabetes management plan needs to be adjusted.   It is performed at least 2 times a year if you are meeting treatment goals.  It is performed 4 times a year if therapy has changed or if you are not meeting treatment goals. Blood pressure test  This test is performed at every routine medical visit. The goal is less than 140/90 mmHg for most people, but 130/80 mmHg in some cases. Ask your health care provider about your goal. Dental exam  Follow up with the dentist regularly. Eye exam  If you are diagnosed with type 1 diabetes as a child, get an exam upon reaching the age of 55 years or older and have had diabetes for 3 5 years. Yearly eye exams are recommended after that initial eye exam.  If you are diagnosed with type 1 diabetes as an adult, get an exam within 5 years of diagnosis and then yearly.  If you are diagnosed with type 2 diabetes, get an exam as soon as possible after the diagnosis and  then yearly. Foot care exam  Visual foot exams are performed at every routine medical visit. The exams check for cuts, injuries, or other problems with the feet.  A comprehensive foot exam should be done yearly. This includes visual inspection as well as assessing foot pulses and testing for loss of sensation.  Check your feet nightly for cuts, injuries, or other problems with your feet. Tell your health care provider if anything is not healing. Kidney function test (urine microalbumin)  This test is performed once a year.  Type 1 diabetes: The first test is performed 5 years after diagnosis.  Type 2 diabetes: The first test is performed at the time of diagnosis.  A serum creatinine and estimated glomerular filtration rate (eGFR) test is done once a year to assess the level of chronic kidney disease (CKD), if present. Lipid profile (cholesterol, HDL, LDL, triglycerides)  Performed every 5 years for most people.  The goal for LDL is less than 100 mg/dL. If you are at high risk, the goal is less than 70 mg/dL.  The goal for HDL is 40 mg/dL 50 mg/dL for men and 50 mg/dL 60 mg/dL for women. An HDL cholesterol of 60 mg/dL or higher gives some protection against heart disease.  The goal for triglycerides is less than 150 mg/dL. Influenza vaccine, pneumococcal vaccine, and hepatitis B vaccine  The influenza vaccine is recommended yearly.  The pneumococcal  vaccine is generally given once in a lifetime. However, there are some instances when another vaccination is recommended. Check with your health care provider.  The hepatitis B vaccine is also recommended for adults with diabetes. Diabetes self-management education  Education is recommended at diagnosis and ongoing as needed. Treatment plan  Your treatment plan is reviewed at every medical visit. Document Released: 11/08/2008 Document Revised: 09/13/2012 Document Reviewed: 06/13/2012 [facility] Patient Information 2014 [place].

## 2013-05-21 NOTE — Progress Notes (Signed)
Subjective:    Patient ID: Jasmine Howard, female    DOB: April 25, 1975, 38 y.o.   MRN: [id number]  HPI  Patient is here for follow up  Reviewed chronic medical issues and interval medical events  Past Medical History  Diagnosis Date  . BENIGN POSITIONAL VERTIGO   . DEPRESSION   . FATTY LIVER DISEASE   . HYPOTHYROIDISM   . Irritable bowel syndrome   . IRITIS   . Polycystic ovaries   . Infertility, female   . Cervical incompetence     s/p cerclage 03/01/12  . Postpartum care following cesarean delivery (breech 11/16) 12/12/2010  . DIABETES MELLITUS, TYPE II     metformin  . ANEMIA-NOS     Review of Systems  Constitutional: Negative for fever and unexpected weight change.  HENT: Negative for sinus pressure and sneezing.   Eyes: Positive for visual disturbance (s/p eye exam and new glasses for same). Negative for pain, discharge and redness.  Respiratory: Negative for cough and shortness of breath.   Cardiovascular: Negative for chest pain, palpitations and leg swelling.  Neurological: Positive for headaches (stress induced, intermittent 2-4x/week). Negative for dizziness, tremors, seizures, facial asymmetry, speech difficulty, weakness, light-headedness and numbness.       Objective:   Physical Exam  BP 112/78  Pulse 84  Temp(Src) 98.3 F (36.8 C) (Oral)  Wt 196 lb 12.8 oz (89.268 kg)  SpO2 98% Wt Readings from Last 3 Encounters:  05/21/13 196 lb 12.8 oz (89.268 kg)  11/20/12 193 lb 6.4 oz (87.726 kg)  09/01/12 208 lb (94.348 kg)   Constitutional: She is overweight, but appears well-developed and well-nourished. No distress.  Neck: Normal range of motion. Neck supple. No JVD present. No thyromegaly present.  Cardiovascular: Normal rate, regular rhythm and normal heart sounds.  No murmur heard. No BLE edema. Pulmonary/Chest: Effort normal and breath sounds normal. No respiratory distress. She has no wheezes.  Neurological: AAOx4, CN 2-12 symmetrically intact, speech,  gait/balance, recall/memory is normal Psychiatric: She has a normal mood and affect. Her behavior is normal. Judgment and thought content normal.   Lab Results  Component Value Date   WBC 9.1 11/20/2012   HGB 11.6* 11/20/2012   HCT 34.2* 11/20/2012   PLT 215.0 11/20/2012   GLUCOSE 102* 11/20/2012   CHOL 154 11/20/2012   TRIG 140.0 11/20/2012   HDL 48.10 11/20/2012   LDLCALC 78 11/20/2012   ALT 34 11/20/2012   AST 23 11/20/2012   NA 139 11/20/2012   K 4.0 11/20/2012   CL 103 11/20/2012   CREATININE 0.6 11/20/2012   BUN 6 11/20/2012   CO2 28 11/20/2012   TSH 1.20 11/20/2012   HGBA1C 5.3 11/20/2012   MICROALBUR 5.0* 11/20/2012    No results found.     Assessment & Plan:   headache - tension-type, exacerbated by no synthroid x 2 mo and stress, occurs 3-4x/week, completely resolves with tylenol 1g prn - neuro exam today benign -  we'll resume Synthroid medication.  Patient instructed if headaches unimprove with resume hormones, unresolved with continued use of Tylenol, or other worsening change, patient will call for further evaluation to include imaging as needed  Problem List Items Addressed This Visit   DIABETES MELLITUS, TYPE II - Primary      Lab Results  Component Value Date   HGBA1C 5.3 11/20/2012   On metformin Check a1c q3-7262mo and titrate as needed The patient is asked to make a resumed effort to improve diet and exercise  patterns to aid in medical management of this problem.     Relevant Orders      Hemoglobin A1c   HYPOTHYROIDISM      On replacement -sporadic use in past 2 months because of insurance issues Check TSH, adjust as needed Lab Results  Component Value Date   TSH 1.20 11/20/2012      Relevant Orders      TSH

## 2013-05-21 NOTE — Assessment & Plan Note (Signed)
Lab Results  Component Value Date   HGBA1C 5.3 11/20/2012   On metformin Check a1c q3-8360mo and titrate as needed The patient is asked to make a resumed effort to improve diet and exercise patterns to aid in medical management of this problem.

## 2013-05-21 NOTE — Progress Notes (Signed)
Jasmine Howard [id number] 05/21/2013  Chief Complaint  Patient presents with  . Follow-up    6 MONTHS    Subjective  HPI  Here for 6 mo f/u  Diabetes Mellitus II: Stable on Metformin BID (Fasting BG on 11/20/12- 102) Has not been monitoring BG levels at home. Saw opthamologist in past few months and received new eye glasses.   Hypothyroidism: Stopped taking Synthroid a few months ago because insurance requires prior authorization for pharmacy. Previously stable according to TSH of 1.2 on 11/20/12.   Has had intermittent HA X 2 months. Has never had a similar episode. Thinks onset occurred around time she d/c Synthroid. Pain originates in left occipital region and radiates to forehead. Describes HA as regular, unilateral, and dull. Occurs off and on throughout the day. Stress/emotional upset trigger it. 1000mg  Tylenol improves sxs. 9/10 on pain scale at worst, resolves to 0/10 with tylenol. Does not currently have HA. HA associated w/ blurry vision, watery eyes. Denies trauma, aura, halos, N/V, dizziness, balance issues, loss of vision.   Past Medical History  Diagnosis Date  . BENIGN POSITIONAL VERTIGO   . DEPRESSION   . FATTY LIVER DISEASE   . HYPOTHYROIDISM   . Irritable bowel syndrome   . IRITIS   . Polycystic ovaries   . Infertility, female   . Cervical incompetence     s/p cerclage 03/01/12  . Postpartum care following cesarean delivery (breech 11/16) 12/12/2010  . DIABETES MELLITUS, TYPE II     metformin  . ANEMIA-NOS     Past Surgical History  Procedure Laterality Date  . Cholecystectomy  2009  . Cesarean section  12/11/2010    Procedure: CESAREAN SECTION;  Surgeon: Tresa Endo A. Fogleman;  Location: [facility];  Service: Gynecology;  Laterality: N/A;  . Cervical cerclage N/A 03/03/2012    Procedure: CERCLAGE CERVICAL;  Surgeon: Tresa Endo A. Ernestina Penna, MD;  Location: [facility];  Service: Gynecology;  Laterality: N/A;  EDD:  09/07/12  . Cesarean section N/A 09/01/2012    Procedure:  Repeat CESAREAN SECTION;  Surgeon: Tresa Endo A. Ernestina Penna, MD;  Location: [facility];  Service: Obstetrics;  Laterality: N/A;  EDD: 09/07/12  . Scar revision  09/01/2012    Procedure: SCAR REVISION;  Surgeon: Tresa Endo A. Ernestina Penna, MD;  Location: [facility];  Service: Obstetrics;;  cesarean section scar revision    Family History  Problem Relation Age of Onset  . Arthritis Other     Parent  . Alcohol abuse Other     Parent    History  Substance Use Topics  . Smoking status: Never Smoker   . Smokeless tobacco: Never Used     Comment: Married, lives with spouse and 2 kids. 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 Alcohol Use: No    Current Outpatient Prescriptions on File Prior to Visit  Medication Sig Dispense Refill  . ibuprofen (ADVIL,MOTRIN) 600 MG tablet Take 600 mg by mouth every 6 (six) hours as needed for pain.      Marland Kitchen levothyroxine (SYNTHROID, LEVOTHROID) 50 MCG tablet Take 50 mcg by mouth daily.       . metFORMIN (GLUCOPHAGE-XR) 500 MG 24 hr tablet Take 500 mg by mouth 2 (two) times daily with a meal.      . Prenatal Vit-Fe Fumarate-FA (PRENATAL MULTIVITAMIN) TABS Take 1 tablet by mouth daily.       No current facility-administered medications on file prior to visit.     Allergies:No Known Allergies  Review of Systems  Constitutional: Negative for fever and chills.  HENT: Negative for congestion, ear pain, hearing loss, sore throat and tinnitus.        Recent hx of HA's, though not currently experiencing sxs.   Eyes: Positive for blurred vision and photophobia. Negative for discharge.       Associated w/ HA though not present at this time.  Respiratory: Negative for cough, shortness of breath and wheezing.   Cardiovascular: Negative for chest pain, palpitations and leg swelling.  Gastrointestinal: Negative for nausea, vomiting, abdominal pain and diarrhea.  Genitourinary:       No urinary changes.  Musculoskeletal: Negative for back pain, myalgias and neck pain.    Neurological: Positive for headaches. Negative for dizziness, tingling, tremors and weakness.       Objective  Filed Vitals:   05/21/13 1445  BP: 112/78  Pulse: 84  Temp: 98.3 F (36.8 C)  TempSrc: Oral  Weight: 196 lb 12.8 oz (89.268 kg)  SpO2: 98%    Physical Exam  Constitutional: She is oriented to person, place, and time. She appears well-developed and well-nourished. No distress.  Patient is obese.  HENT:  Right Ear: External ear normal.  Left Ear: External ear normal.  Nose: Nose normal.  Mouth/Throat: Oropharynx is clear and moist. No oropharyngeal exudate.  Eyes: EOM are normal. Pupils are equal, round, and reactive to light. Right eye exhibits no discharge. No scleral icterus.  Neck: Normal range of motion. Neck supple. No thyromegaly present.  Cardiovascular: Normal rate, regular rhythm, normal heart sounds and intact distal pulses.  Exam reveals no gallop and no friction rub.   No murmur heard. Respiratory: Effort normal and breath sounds normal. No respiratory distress. She has no wheezes. She has no rales.  Musculoskeletal: She exhibits no edema.  Lymphadenopathy:    She has no cervical adenopathy.  Neurological: She is alert and oriented to person, place, and time. She has normal reflexes. She displays normal reflexes. No cranial nerve deficit. She exhibits normal muscle tone. Coordination normal.  Gait, motor, proprioception, coordination, and sensation all in tact. CN II - CN XII all in tact.     BP Readings from Last 3 Encounters:  05/21/13 112/78  05/01/13 120/76  11/20/12 112/72    Wt Readings from Last 3 Encounters:  05/21/13 196 lb 12.8 oz (89.268 kg)  11/20/12 193 lb 6.4 oz (87.726 kg)  09/01/12 208 lb (94.348 kg)    Lab Results  Component Value Date   WBC 9.1 11/20/2012   HGB 11.6* 11/20/2012   HCT 34.2* 11/20/2012   PLT 215.0 11/20/2012   GLUCOSE 102* 11/20/2012   CHOL 154 11/20/2012   TRIG 140.0 11/20/2012   HDL 48.10 11/20/2012    LDLCALC 78 11/20/2012   ALT 34 11/20/2012   AST 23 11/20/2012   NA 139 11/20/2012   K 4.0 11/20/2012   CL 103 11/20/2012   CREATININE 0.6 11/20/2012   BUN 6 11/20/2012   CO2 28 11/20/2012   TSH 1.20 11/20/2012   HGBA1C 5.3 11/20/2012   MICROALBUR 5.0* 11/20/2012    No results found.     Assessment and Plan  Diabetes Mellitus II: Here for 6 mo f/u. Last BG level checked 11/20/12 120. Stable on current medication regimen of metformin. Has not been monitoring BG at home. Order A1C. Adjust medication regimen if necessary. Encouraged medication compliance and home monitoring of BG. Told patient to continue following up with opthamalogist yearly for eye exam.  Hypothyroidism: Last TSH WNL on 11/20/12.  Stopped taking Synthroid about 2 months ago because insurance required pre-authorization. Check TSH and get prior authorization for Synthroid for patient.  Tension HA: Unilateral, daily HA X 2 mos since stopped taking Synthroid. Triggered by emotional stress/anxiety and relieved by 1000mg  of Tylenol. Neurological exam normal. If HA does not resolve after retaking Synthroid, told patient further imaging would be done to evaluate for possible neurological etiology.   Labs ordered: TSH, A1C  Told patient if sxs worsen or if new problems arise, to please call.   No Follow-up on file. Larwance RoteKatie G Amarachukwu Lakatos, Student-PA    I have personally reviewed this case with PA student. I also personally examined this patient. I agree with history and findings as documented above. I reviewed, discussed and approve of the assessment and plan as listed above. Newt LukesValerie A Leschber, MD

## 2013-05-21 NOTE — Progress Notes (Signed)
Pre visit review using our clinic review tool, if applicable. No additional management support is needed unless otherwise documented below in the visit note. 

## 2013-07-04 ENCOUNTER — Other Ambulatory Visit: Payer: Self-pay | Admitting: *Deleted

## 2013-07-04 MED ORDER — LEVOTHYROXINE SODIUM 50 MCG PO TABS: 50.0000 ug | ORAL_TABLET | Freq: Every day | ORAL | Status: DC

## 2013-11-21 ENCOUNTER — Encounter: Payer: BC Managed Care – PPO | Admitting: Internal Medicine

## 2013-11-21 ENCOUNTER — Other Ambulatory Visit (INDEPENDENT_AMBULATORY_CARE_PROVIDER_SITE_OTHER): Payer: BC Managed Care – PPO

## 2013-11-21 ENCOUNTER — Encounter: Payer: Self-pay | Admitting: Family

## 2013-11-21 ENCOUNTER — Ambulatory Visit (INDEPENDENT_AMBULATORY_CARE_PROVIDER_SITE_OTHER): Payer: BC Managed Care – PPO | Admitting: Family

## 2013-11-21 VITALS — BP 112/70 | HR 76 | Temp 98.3°F | Resp 18 | Ht 62.0 in | Wt 195.2 lb

## 2013-11-21 DIAGNOSIS — IMO0002 Reserved for concepts with insufficient information to code with codable children: Secondary | ICD-10-CM | POA: Insufficient documentation

## 2013-11-21 DIAGNOSIS — E039 Hypothyroidism, unspecified: Secondary | ICD-10-CM

## 2013-11-21 DIAGNOSIS — N941 Dyspareunia: Secondary | ICD-10-CM

## 2013-11-21 DIAGNOSIS — E119 Type 2 diabetes mellitus without complications: Secondary | ICD-10-CM

## 2013-11-21 LAB — BASIC METABOLIC PANEL
BUN: 11 mg/dL (ref 6–23)
CALCIUM: 9.5 mg/dL (ref 8.4–10.5)
CO2: 27 meq/L (ref 19–32)
CREATININE: 0.6 mg/dL (ref 0.4–1.2)
Chloride: 104 mEq/L (ref 96–112)
GFR: 115.79 mL/min (ref >60.00)
GLUCOSE: 87 mg/dL (ref 70–99)
Potassium: 4.2 mEq/L (ref 3.5–5.1)
Sodium: 137 mEq/L (ref 135–145)

## 2013-11-21 LAB — TSH: TSH: 2.67 u[IU]/mL (ref 0.35–4.50)

## 2013-11-21 LAB — HEMOGLOBIN A1C: Hgb A1c MFr Bld: 5.5 % (ref 4.6–6.5)

## 2013-11-21 MED ORDER — METFORMIN HCL ER 500 MG PO TB24: 500.0000 mg | ORAL_TABLET | Freq: Two times a day (BID) | ORAL | Status: DC

## 2013-11-21 MED ORDER — PRENATAL MULTIVITAMIN CH: 1.0000 | ORAL_TABLET | Freq: Every day | ORAL | Status: DC

## 2013-11-21 MED ORDER — LEVOTHYROXINE SODIUM 50 MCG PO TABS: 50.0000 ug | ORAL_TABLET | Freq: Every day | ORAL | Status: DC

## 2013-11-21 NOTE — Assessment & Plan Note (Signed)
Describes symptoms of vaginal dryness. Discussed starting with over the counter lubricants such as KY jelly. Will have her follow up with GYN regarding potential for any hormonal therapy if alternative lubricant does not work.

## 2013-11-21 NOTE — Patient Instructions (Addendum)
Thank you for choosing [facility].  Summary/Instructions:   Please continue to take your medication as prescribed  Please stop by the lab for your blood work - we will be in touch regarding   Follow up for your physical as needed.  Follow up for diabetes / thyroid in about 6 months unless labs suggest otherwise.

## 2013-11-21 NOTE — Progress Notes (Signed)
Pre visit review using our clinic review tool, if applicable. No additional management support is needed unless otherwise documented below in the visit note. 

## 2013-11-21 NOTE — Assessment & Plan Note (Addendum)
Hypothyroid appears controlled at present. Last TSH 1.91 in April 2015. Denies any associated symptoms. Continue levothyroxine 50 mcg daily. Recheck TSH today.

## 2013-11-21 NOTE — Assessment & Plan Note (Addendum)
Appears currently controlled. Last A1c was 5.4. Denies any current symptoms. Continue metformin-XR 500 mg daily. Obtain A1c, BMET and microalbumin. Diabetic foot exam completed. Follow up in 6 months as needed.

## 2013-11-21 NOTE — Progress Notes (Signed)
   Subjective:    Patient ID: Jasmine Howard, female    DOB: 07-Sep-1975, 38 y.o.   MRN: [id number]  No chief complaint on file.   HPI:  Jasmine HalsOlivia M Nunziata is a 38 y.o. female who presents today for diabetes follow up.   1) Diabetes - Currently maintained on metformin. Denies any issues with GI upset or diarrhea unless she doesn't take it with foot. Up to date with last diabetic eye exam. Denies any other adverse side effects.   Lab Results  Component Value Date   HGBA1C 5.4 05/21/2013    2) Hypothyroidism - Currently maintained on 50 mcg daily. Denies any heat/cold intolerance or changes to hair/nails.   Lab Results  Component Value Date   TSH 1.91 05/21/2013   3) Dyspareunia - concern for going through menopause because of excessive dryness during intercourse. States that the intensity can be overwhelming that she does not want to have intercourse.    No Known Allergies  Current Outpatient Prescriptions on File Prior to Visit  Medication Sig Dispense Refill  . ibuprofen (ADVIL,MOTRIN) 600 MG tablet Take 600 mg by mouth every 6 (six) hours as needed for pain.      Marland Kitchen. levonorgestrel (MIRENA) 20 MCG/24HR IUD 1 Intra Uterine Device (1 each total) by Intrauterine route once.  1 each  0   No current facility-administered medications on file prior to visit.   Past Medical History  Diagnosis Date  . BENIGN POSITIONAL VERTIGO   . DEPRESSION   . FATTY LIVER DISEASE   . HYPOTHYROIDISM   . Irritable bowel syndrome   . IRITIS   . Polycystic ovaries   . Infertility, female   . Cervical incompetence     s/p cerclage 03/01/12  . Postpartum care following cesarean delivery (breech 11/16) 12/12/2010  . DIABETES MELLITUS, TYPE II     metformin  . ANEMIA-NOS     Review of Systems    See HPI Objective:    BP 112/70  Pulse 76  Temp(Src) 98.3 F (36.8 C) (Oral)  Resp 18  Ht 5\' 2"  (1.575 m)  Wt 195 lb 3.2 oz (88.542 kg)  BMI 35.69 kg/m2  SpO2 97%  Nursing note and vital signs  reviewed.  Physical Exam  Constitutional: She is oriented to person, place, and time. She appears well-developed and well-nourished.  Cardiovascular: Normal rate, regular rhythm, normal heart sounds and intact distal pulses.   Pulmonary/Chest: Effort normal and breath sounds normal.  Musculoskeletal:  Diabetic foot exam completed.   Neurological: She is alert and oriented to person, place, and time.  Skin: Skin is warm and dry.  Psychiatric: She has a normal mood and affect. Her behavior is normal. Judgment and thought content normal.       Assessment & Plan:

## 2013-11-22 ENCOUNTER — Telehealth: Payer: Self-pay | Admitting: Family

## 2013-11-22 LAB — MICROALBUMIN, URINE: Microalb, Ur: 0.3 mg/dL (ref <2.0)

## 2013-11-22 NOTE — Telephone Encounter (Signed)
Please call the patient to inform her that her lab work that was completed was within the expected ranges. Her A1c was 5.5 which indicates her diabetes is well controlled. So right now there is nothing that needs to be changed.

## 2013-11-23 NOTE — Telephone Encounter (Signed)
Called pt to let her know results. No answer left a message for her to call me back

## 2013-11-26 ENCOUNTER — Encounter: Payer: Self-pay | Admitting: Family

## 2013-11-26 NOTE — Telephone Encounter (Signed)
Sent results in the mail. 

## 2014-03-29 ENCOUNTER — Ambulatory Visit (INDEPENDENT_AMBULATORY_CARE_PROVIDER_SITE_OTHER): Payer: BLUE CROSS/BLUE SHIELD | Admitting: Family

## 2014-03-29 ENCOUNTER — Encounter: Payer: Self-pay | Admitting: Family

## 2014-03-29 VITALS — BP 112/84 | HR 68 | Temp 97.9°F | Resp 18 | Ht 62.0 in | Wt 199.0 lb

## 2014-03-29 DIAGNOSIS — R519 Headache, unspecified: Secondary | ICD-10-CM | POA: Insufficient documentation

## 2014-03-29 DIAGNOSIS — R51 Headache: Secondary | ICD-10-CM

## 2014-03-29 MED ORDER — ACETAMINOPHEN-CODEINE #3 300-30 MG PO TABS: 1.0000 | ORAL_TABLET | Freq: Four times a day (QID) | ORAL | Status: DC | PRN

## 2014-03-29 NOTE — Patient Instructions (Addendum)
Thank you for choosing [facility].  Summary/Instructions:  Try Excedrine migraine which is available OTC. If this does not work, please use the Tylenol #3 as needed for pain.   Also recommend some sudafed.  Your prescription(s) have been submitted to your pharmacy or been printed and provided for you. Please take as directed and contact our office if you believe you are having problem(s) with the medication(s) or have any questions.  If your symptoms worsen or fail to improve, please contact our office for further instruction, or in case of emergency go directly to the emergency room at the closest medical facility.    Tension Headache A tension headache is a feeling of pain, pressure, or aching often felt over the front and sides of the head. The pain can be dull or can feel tight (constricting). It is the most common type of headache. Tension headaches are not normally associated with nausea or vomiting and do not get worse with physical activity. Tension headaches can last 30 minutes to several days.  CAUSES  The exact cause is not known, but it may be caused by chemicals and hormones in the brain that lead to pain. Tension headaches often begin after stress, anxiety, or depression. Other triggers may include:  Alcohol.  Caffeine (too much or withdrawal).  Respiratory infections (colds, flu, sinus infections).  Dental problems or teeth clenching.  Fatigue.  Holding your head and neck in one position too long while using a computer. SYMPTOMS   Pressure around the head.   Dull, aching head pain.   Pain felt over the front and sides of the head.   Tenderness in the muscles of the head, neck, and shoulders. DIAGNOSIS  A tension headache is often diagnosed based on:   Symptoms.   Physical examination.   A CT scan or MRI of your head. These tests may be ordered if symptoms are severe or unusual. TREATMENT  Medicines may be given to help relieve symptoms.   HOME CARE INSTRUCTIONS   Only take over-the-counter or prescription medicines for pain or discomfort as directed by your caregiver.   Lie down in a dark, quiet room when you have a headache.   Keep a journal to find out what may be triggering your headaches. For example, write down:  What you eat and drink.  How much sleep you get.  Any change to your diet or medicines.  Try massage or other relaxation techniques.   Ice packs or heat applied to the head and neck can be used. Use these 3 to 4 times per day for 15 to 20 minutes each time, or as needed.   Limit stress.   Sit up straight, and do not tense your muscles.   Quit smoking if you smoke.  Limit alcohol use.  Decrease the amount of caffeine you drink, or stop drinking caffeine.  Eat and exercise regularly.  Get 7 to 9 hours of sleep, or as recommended by your caregiver.  Avoid excessive use of pain medicine as recurrent headaches can occur.  SEEK MEDICAL CARE IF:   You have problems with the medicines you were prescribed.  Your medicines do not work.  You have a change from the usual headache.  You have nausea or vomiting. SEEK IMMEDIATE MEDICAL CARE IF:   Your headache becomes severe.  You have a fever.  You have a stiff neck.  You have loss of vision.  You have muscular weakness or loss of muscle control.  You lose your  balance or have trouble walking.  You feel faint or pass out.  You have severe symptoms that are different from your first symptoms. MAKE SURE YOU:   Understand these instructions.  Will watch your condition.  Will get help right away if you are not doing well or get worse. Document Released: 01/11/2005 Document Revised: 04/05/2011 Document Reviewed: 01/01/2011 [facility] Patient Information 2015 [place] [place]. This information is not intended to replace advice given to you by your health care provider. Make sure you discuss any questions you have with your health  care provider.

## 2014-03-29 NOTE — Progress Notes (Signed)
Subjective:    Patient ID: Jasmine Howard, female    DOB: Mar 26, 1975, 39 y.o.   MRN: [id number]  Chief Complaint  Patient presents with  . Follow-up    x3 headache says it makes her head and face feel numb, unable to focus, no energy, thinks she might have an infection in one of her teeth    HPI:  Jasmine Howard is a 39 y.o. female who presents today for an acute visit.  This is a new problem. Associated symptom of headache has been going on for about 3 days. Indicates she has had a waxing and waning headache for a couple of weeks. Headaches are described as pressure-like which also make her head and face feel numb with an intensity of 8-9/10 and is located in the back of her head. Describes some sensitivity to light and sound. Denies nausea and vomiting. Has tried Tylenol and Aleve which have provided minimal relief. Denies any trauma to her head. Denies history of migraines or other headaches.    No Known Allergies  Current Outpatient Prescriptions on File Prior to Visit  Medication Sig Dispense Refill  . ibuprofen (ADVIL,MOTRIN) 600 MG tablet Take 600 mg by mouth every 6 (six) hours as needed for pain.    Marland Kitchen levonorgestrel (MIRENA) 20 MCG/24HR IUD 1 Intra Uterine Device (1 each total) by Intrauterine route once. 1 each 0  . levothyroxine (SYNTHROID, LEVOTHROID) 50 MCG tablet Take 1 tablet (50 mcg total) by mouth daily. 90 tablet 3  . metFORMIN (GLUCOPHAGE-XR) 500 MG 24 hr tablet Take 1 tablet (500 mg total) by mouth 2 (two) times daily with a meal. 180 tablet 3  . Prenatal Vit-Fe Fumarate-FA (PRENATAL MULTIVITAMIN) TABS tablet Take 1 tablet by mouth daily. 90 tablet 3   No current facility-administered medications on file prior to visit.    Past Medical History  Diagnosis Date  . BENIGN POSITIONAL VERTIGO   . DEPRESSION   . FATTY LIVER DISEASE   . HYPOTHYROIDISM   . Irritable bowel syndrome   . IRITIS   . Polycystic ovaries   . Infertility, female   . Cervical  incompetence     s/p cerclage 03/01/12  . Postpartum care following cesarean delivery (breech 11/16) 12/12/2010  . DIABETES MELLITUS, TYPE II     metformin  . ANEMIA-NOS     Review of Systems  Constitutional: Positive for chills. Negative for fever.  HENT: Negative for congestion and ear pain.   Neurological: Positive for headaches.      Objective:    BP 112/84 mmHg  Pulse 68  Temp(Src) 97.9 F (36.6 C) (Oral)  Resp 18  Ht  (1.575 m)  Wt 199 lb (90.266 kg)  BMI 36.39 kg/m2  SpO2 98% Nursing note and vital signs reviewed.  Physical Exam  Constitutional: She is oriented to person, place, and time. She appears well-developed and well-nourished. No distress.  HENT:  Right Ear: Hearing, tympanic membrane, external ear and ear canal normal.  Left Ear: Hearing, tympanic membrane, external ear and ear canal normal.  Nose: Right sinus exhibits no maxillary sinus tenderness and no frontal sinus tenderness. Left sinus exhibits no maxillary sinus tenderness and no frontal sinus tenderness.  Mouth/Throat: Uvula is midline, oropharynx is clear and moist and mucous membranes are normal.  Cardiovascular: Normal rate, regular rhythm, normal heart sounds and intact distal pulses.   Pulmonary/Chest: Effort normal and breath sounds normal.  Neurological: She is alert and oriented to person, place, and time.  She has normal reflexes. No cranial nerve deficit. She exhibits normal muscle tone. Coordination normal.  Skin: Skin is warm and dry.  Psychiatric: She has a normal mood and affect. Her behavior is normal. Judgment and thought content normal.       Assessment & Plan:

## 2014-03-29 NOTE — Assessment & Plan Note (Signed)
Patient presents with mixed headache, with no distinct cause. Headache has been refractory to over-the-counter Tylenol and Aleve. Recommend trying over-the-counter Excedrin Migraine. If this does not work start Tylenol 3 as needed for extreme pain. Patient instructed to seek emergency care if the worst headache of her life develops. Follow up if symptoms worsen or fail to improve.

## 2014-03-29 NOTE — Progress Notes (Signed)
Pre visit review using our clinic review tool, if applicable. No additional management support is needed unless otherwise documented below in the visit note. 

## 2014-04-02 ENCOUNTER — Other Ambulatory Visit: Payer: Self-pay | Admitting: Family

## 2014-04-02 ENCOUNTER — Telehealth: Payer: Self-pay

## 2014-04-02 MED ORDER — ACETAMINOPHEN-CODEINE #3 300-30 MG PO TABS: 1.0000 | ORAL_TABLET | Freq: Four times a day (QID) | ORAL | Status: DC | PRN

## 2014-04-02 NOTE — Telephone Encounter (Signed)
Received an e-mail printed on my desk that stated that pt did not get her pain medicine at her visit with Tammy SoursGreg on 03/29/2015. Tried calling pt to get this cleared up. Phone was cut off and LVM for her to call back.

## 2014-04-09 ENCOUNTER — Encounter: Payer: Self-pay | Admitting: Internal Medicine

## 2014-04-09 ENCOUNTER — Ambulatory Visit (INDEPENDENT_AMBULATORY_CARE_PROVIDER_SITE_OTHER): Payer: BLUE CROSS/BLUE SHIELD | Admitting: Internal Medicine

## 2014-04-09 VITALS — BP 122/76 | HR 71 | Temp 98.1°F | Ht 62.0 in | Wt 199.0 lb

## 2014-04-09 DIAGNOSIS — G8911 Acute pain due to trauma: Secondary | ICD-10-CM

## 2014-04-09 DIAGNOSIS — M545 Low back pain: Secondary | ICD-10-CM

## 2014-04-09 MED ORDER — PREDNISONE 20 MG PO TABS: 20.0000 mg | ORAL_TABLET | Freq: Two times a day (BID) | ORAL | Status: DC

## 2014-04-09 MED ORDER — TRAMADOL HCL 50 MG PO TABS: 50.0000 mg | ORAL_TABLET | Freq: Four times a day (QID) | ORAL | Status: DC | PRN

## 2014-04-09 MED ORDER — CYCLOBENZAPRINE HCL 5 MG PO TABS: ORAL_TABLET | ORAL | Status: DC

## 2014-04-09 NOTE — Progress Notes (Signed)
   Subjective:    Patient ID: Jasmine Howard, female    DOB: 1975-05-17, 39 y.o.   MRN: [id number]  HPI  Symptoms began as acute LS area pain after she misstepped while on a ladder hanging decorations 04/04/14 & landed on her feet. The pain has persisted up to a level X. It's described as sharp. It is worse with movement. Bending over and rising to standing position aggravates it. Using pillows and cushions when positioned in bed has helped. Tylenol 3 previously prescribed for headache was of no benefit. She states that she's had some pain radiating down to the knees bilaterally.   Review of Systems  She denies fever, chills, sweats.  She has no numbness, tingling, weakness in lower extremities.  There is no loss of control of bladder or bowel function.     Objective:   Physical Exam  Pertinent or positive findings include: Abdomen is protuberant.  She exhibits the classic low back crawl as she lies down and sits up on the exam table.  Straight leg raising is negative to 80 bilaterally.  She is unable to do heel and toe walking due to low back pain.   General appearance :adequately nourished; in no distress. Eyes: No conjunctival inflammation or scleral icterus is present. Heart:  Normal rate and regular rhythm. S1 and S2 normal without gallop, murmur, click, rub or other extra sounds   Lungs:Chest clear to auscultation; no wheezes, rhonchi,rales ,or rubs present.No increased work of breathing.  Abdomen: bowel sounds normal, soft and non-tender without masses, organomegaly or hernias noted.  No guarding or rebound. Vascular : all pulses equal ; no bruits present. Skin:Warm & dry.  Intact without suspicious lesions or rashes ; no tenting  Lymphatic: No lymphadenopathy is noted about the head, neck, axilla Neuro: Strength, tone & DTRs normal.        Assessment & Plan:  #1 acute low back pain related to mechanical injury  Plan: See orders and recommendations

## 2014-04-09 NOTE — Patient Instructions (Signed)
Use an anti-inflammatory cream such as Aspercreme or Zostrix cream twice a day to the affected area as needed. In lieu of this warm moist compresses or  hot water bottle can be used. Do not apply ice . 

## 2014-04-09 NOTE — Progress Notes (Signed)
Pre visit review using our clinic review tool, if applicable. No additional management support is needed unless otherwise documented below in the visit note. 

## 2014-04-16 ENCOUNTER — Ambulatory Visit (INDEPENDENT_AMBULATORY_CARE_PROVIDER_SITE_OTHER)
Admission: RE | Admit: 2014-04-16 | Discharge: 2014-04-16 | Disposition: A | Discharge status: Home / Self Care | Payer: BLUE CROSS/BLUE SHIELD | Source: Ambulatory Visit | Attending: Internal Medicine | Admitting: Internal Medicine

## 2014-04-16 ENCOUNTER — Ambulatory Visit (INDEPENDENT_AMBULATORY_CARE_PROVIDER_SITE_OTHER): Payer: BLUE CROSS/BLUE SHIELD | Admitting: Internal Medicine

## 2014-04-16 ENCOUNTER — Encounter: Payer: Self-pay | Admitting: Internal Medicine

## 2014-04-16 VITALS — BP 100/80 | HR 81 | Temp 98.3°F | Ht 62.0 in | Wt 199.1 lb

## 2014-04-16 DIAGNOSIS — M545 Low back pain, unspecified: Secondary | ICD-10-CM

## 2014-04-16 MED ORDER — HYDROCODONE-ACETAMINOPHEN 10-325 MG PO TABS: 1.0000 | ORAL_TABLET | Freq: Three times a day (TID) | ORAL | Status: DC | PRN

## 2014-04-16 MED ORDER — CYCLOBENZAPRINE HCL 5 MG PO TABS: ORAL_TABLET | ORAL | Status: DC

## 2014-04-16 NOTE — Progress Notes (Signed)
   Subjective:    Patient ID: Jasmine Howard, female    DOB: 02-Dec-1975, 39 y.o.   MRN: [id number]  HPI She returns with persistent lumbosacral pain. Original insult was stepping off a ladder landing on her feet 13 days ago. Tramadol was of no benefit. The muscle relaxant @ night was somewhat helpful. Sitting and standing she has pain that radiates from the buttocks down the posterior thighs and lateral thighs as far as the knees. It is described as burning. Supine position does improve the pain.   Review of Systems She denies fever, chills, sweats.  She has no loss control of bladder or bowels.  There is no associated numbness, tingling, weakness in the lower extremities.     Objective:   Physical Exam Pertinent or positive findings include: Abdomen is protuberant.  She has pain with flexion of the knees or elevation of the lower extremities beyond 15.  She was unable to do heel or toe walking due to the pain. She exhibits the classic "low back crawl" lying down and sitting up on the exam table.  General appearance :adequately nourished; in no distress but uncomfortable. Eyes: No conjunctival inflammation or scleral icterus is present. Heart:  Normal rate and regular rhythm. S1 and S2 normal without gallop, murmur, click, rub or other extra sounds   Lungs:Chest clear to auscultation; no wheezes, rhonchi,rales ,or rubs present.No increased work of breathing.  Abdomen: bowel sounds normal, soft and non-tender without masses, organomegaly or hernias noted.  No guarding or rebound. No flank tenderness to percussion. Vascular : all pulses equal ; no bruits present. Skin:Warm & dry.  Intact without suspicious lesions or rashes ; no tenting  Lymphatic: No lymphadenopathy is noted about the head, neck, axilla Neuro: Strength, tone & DTRs normal.        Assessment & Plan:  #1 acute low back pain  Plan :see orders and recommendations

## 2014-04-16 NOTE — Progress Notes (Signed)
Pre visit review using our clinic review tool, if applicable. No additional management support is needed unless otherwise documented below in the visit note. 

## 2014-04-16 NOTE — Patient Instructions (Signed)
  Your next office appointment will be determined based upon review of your pending  x-rays. Those instructions will be transmitted to you through by mail. Critical results will be called. Followup as needed for any active or acute issue. Please report any significant change in your symptoms.

## 2014-05-15 ENCOUNTER — Telehealth: Payer: Self-pay | Admitting: *Deleted

## 2014-05-15 NOTE — Telephone Encounter (Signed)
Pt never pick-up rx for  Tylenol #3 dated 04/02/14. Shredded script...Raechel Chute/lmb

## 2014-11-27 ENCOUNTER — Other Ambulatory Visit: Payer: Self-pay | Admitting: Family

## 2014-12-13 ENCOUNTER — Other Ambulatory Visit: Payer: Self-pay | Admitting: Family

## 2015-01-09 ENCOUNTER — Ambulatory Visit: Payer: BLUE CROSS/BLUE SHIELD | Admitting: Family

## 2015-01-10 ENCOUNTER — Other Ambulatory Visit (INDEPENDENT_AMBULATORY_CARE_PROVIDER_SITE_OTHER): Payer: BLUE CROSS/BLUE SHIELD

## 2015-01-10 ENCOUNTER — Ambulatory Visit (INDEPENDENT_AMBULATORY_CARE_PROVIDER_SITE_OTHER): Payer: BLUE CROSS/BLUE SHIELD | Admitting: Internal Medicine

## 2015-01-10 ENCOUNTER — Ambulatory Visit (HOSPITAL_COMMUNITY)
Admission: RE | Admit: 2015-01-10 | Discharge: 2015-01-10 | Disposition: A | Discharge status: Home / Self Care | Payer: BLUE CROSS/BLUE SHIELD | Source: Ambulatory Visit | Attending: Internal Medicine | Admitting: Internal Medicine

## 2015-01-10 ENCOUNTER — Telehealth: Payer: Self-pay | Admitting: *Deleted

## 2015-01-10 ENCOUNTER — Encounter: Payer: Self-pay | Admitting: Internal Medicine

## 2015-01-10 VITALS — BP 100/68 | HR 72 | Temp 98.1°F | Wt 205.0 lb

## 2015-01-10 DIAGNOSIS — R1031 Right lower quadrant pain: Secondary | ICD-10-CM | POA: Insufficient documentation

## 2015-01-10 DIAGNOSIS — Z975 Presence of (intrauterine) contraceptive device: Secondary | ICD-10-CM | POA: Diagnosis not present

## 2015-01-10 DIAGNOSIS — K760 Fatty (change of) liver, not elsewhere classified: Secondary | ICD-10-CM | POA: Insufficient documentation

## 2015-01-10 DIAGNOSIS — R112 Nausea with vomiting, unspecified: Secondary | ICD-10-CM

## 2015-01-10 DIAGNOSIS — Z9049 Acquired absence of other specified parts of digestive tract: Secondary | ICD-10-CM | POA: Insufficient documentation

## 2015-01-10 DIAGNOSIS — N83201 Unspecified ovarian cyst, right side: Secondary | ICD-10-CM | POA: Insufficient documentation

## 2015-01-10 DIAGNOSIS — R1030 Lower abdominal pain, unspecified: Secondary | ICD-10-CM | POA: Insufficient documentation

## 2015-01-10 LAB — URINALYSIS
BILIRUBIN URINE: NEGATIVE
HGB URINE DIPSTICK: NEGATIVE
Ketones, ur: NEGATIVE
LEUKOCYTES UA: NEGATIVE
Nitrite: NEGATIVE
Specific Gravity, Urine: 1.02 (ref 1.000–1.030)
TOTAL PROTEIN, URINE-UPE24: NEGATIVE
UROBILINOGEN UA: 0.2 (ref 0.0–1.0)
Urine Glucose: NEGATIVE
pH: 6.5 (ref 5.0–8.0)

## 2015-01-10 LAB — HEPATIC FUNCTION PANEL
ALT: 40 U/L — AB (ref 0–35)
AST: 20 U/L (ref 0–37)
Albumin: 4.2 g/dL (ref 3.5–5.2)
Alkaline Phosphatase: 66 U/L (ref 39–117)
BILIRUBIN DIRECT: 0.1 mg/dL (ref 0.0–0.3)
BILIRUBIN TOTAL: 0.5 mg/dL (ref 0.2–1.2)
TOTAL PROTEIN: 7.6 g/dL (ref 6.0–8.3)

## 2015-01-10 LAB — CBC WITH DIFFERENTIAL/PLATELET
BASOS ABS: 0 10*3/uL (ref 0.0–0.1)
Basophils Relative: 0.2 % (ref 0.0–3.0)
EOS ABS: 0.1 10*3/uL (ref 0.0–0.7)
Eosinophils Relative: 1.9 % (ref 0.0–5.0)
HEMATOCRIT: 38.8 % (ref 36.0–46.0)
HEMOGLOBIN: 13.1 g/dL (ref 12.0–15.0)
LYMPHS PCT: 25.9 % (ref 12.0–46.0)
Lymphs Abs: 1.7 10*3/uL (ref 0.7–4.0)
MCHC: 33.8 g/dL (ref 30.0–36.0)
MCV: 85 fl (ref 78.0–100.0)
MONO ABS: 0.5 10*3/uL (ref 0.1–1.0)
Monocytes Relative: 8 % (ref 3.0–12.0)
Neutro Abs: 4.3 10*3/uL (ref 1.4–7.7)
Neutrophils Relative %: 64 % (ref 43.0–77.0)
Platelets: 214 10*3/uL (ref 150.0–400.0)
RBC: 4.56 Mil/uL (ref 3.87–5.11)
RDW: 13.5 % (ref 11.5–15.5)
WBC: 6.8 10*3/uL (ref 4.0–10.5)

## 2015-01-10 LAB — BASIC METABOLIC PANEL
BUN: 7 mg/dL (ref 6–23)
CALCIUM: 9.3 mg/dL (ref 8.4–10.5)
CHLORIDE: 104 meq/L (ref 96–112)
CO2: 29 mEq/L (ref 19–32)
CREATININE: 0.6 mg/dL (ref 0.40–1.20)
GFR: 117.35 mL/min (ref >60.00)
Glucose, Bld: 103 mg/dL — ABNORMAL HIGH (ref 70–99)
Potassium: 4.1 mEq/L (ref 3.5–5.1)
Sodium: 139 mEq/L (ref 135–145)

## 2015-01-10 LAB — SEDIMENTATION RATE: SED RATE: 22 mm/h (ref 0–22)

## 2015-01-10 LAB — LIPASE: LIPASE: 55 U/L (ref 11.0–59.0)

## 2015-01-10 MED ORDER — HYDROCODONE-ACETAMINOPHEN 5-325 MG PO TABS: 1.0000 | ORAL_TABLET | Freq: Four times a day (QID) | ORAL | Status: DC | PRN

## 2015-01-10 MED ORDER — CIPROFLOXACIN HCL 500 MG PO TABS: 500.0000 mg | ORAL_TABLET | Freq: Two times a day (BID) | ORAL | Status: DC

## 2015-01-10 MED ORDER — IOHEXOL 300 MG/ML  SOLN
100.0000 mL | Freq: Once | INTRAMUSCULAR | Status: AC | PRN
  Administered 2015-01-10: 100 mL via INTRAVENOUS

## 2015-01-10 MED ORDER — ONDANSETRON HCL 4 MG PO TABS: 4.0000 mg | ORAL_TABLET | Freq: Three times a day (TID) | ORAL | Status: DC | PRN

## 2015-01-10 MED ORDER — IOHEXOL 300 MG/ML  SOLN
50.0000 mL | Freq: Once | INTRAMUSCULAR | Status: AC | PRN
  Administered 2015-01-10: 50 mL via ORAL

## 2015-01-10 NOTE — Patient Instructions (Signed)
Go to ER if worse 

## 2015-01-10 NOTE — Assessment & Plan Note (Signed)
12/16 r/o appendicitis vs other, ie ovarian cyst, [facility] Labs stat CT today Empiric abx Pain meds, Zofran 

## 2015-01-10 NOTE — Assessment & Plan Note (Addendum)
12/16 r/o appendicitis vs other, ie ovarian cyst, [facility] Labs stat CT today Empiric abx Pain meds, Zofran

## 2015-01-10 NOTE — Progress Notes (Signed)
Subjective:  Patient ID: Jasmine Howard, female    DOB: August 09, 1975  Age: 39 y.o. MRN: [id number]  CC: No chief complaint on file.   Abdominal Pain This is a new problem. The current episode started in the past 7 days. The onset quality is undetermined. The problem occurs 2 to 4 times per day. The problem has been waxing and waning. The pain is located in the RLQ. The pain is severe. The quality of the pain is cramping, dull, a sensation of fullness and colicky. The abdominal pain radiates to the suprapubic region. Associated symptoms include nausea and vomiting. Pertinent negatives include no constipation, diarrhea, dysuria, fever, frequency, headaches or hematuria. The pain is aggravated by eating. The pain is relieved by nothing. She has tried nothing for the symptoms. Her past medical history is significant for abdominal surgery and gallstones. There is no history of pancreatitis or ulcerative colitis.   Jasmine HalsOlivia M Gravois presents for RLQ abd pain since Sat  LMP July - on [place]    Outpatient Prescriptions Prior to Visit  Medication Sig Dispense Refill  . HYDROcodone-acetaminophen (NORCO) 10-325 MG per tablet Take 1 tablet by mouth every 8 (eight) hours as needed. 30 tablet 0  . levonorgestrel (MIRENA) 20 MCG/24HR IUD 1 Intra Uterine Device (1 each total) by Intrauterine route once. 1 each 0  . levothyroxine (SYNTHROID, LEVOTHROID) 50 MCG tablet TAKE 1 TABLET DAILY 90 tablet 0  . metFORMIN (GLUCOPHAGE-XR) 500 MG 24 hr tablet TAKE 1 TABLET TWICE A DAY WITH MEALS 180 tablet 2  . acetaminophen-codeine (TYLENOL #3) 300-30 MG per tablet Take 1 tablet by mouth every 6 (six) hours as needed for moderate pain. (Patient not taking: Reported on 01/10/2015) 30 tablet 0  . cyclobenzaprine (FLEXERIL) 5 MG tablet 1-2 qhs prn (Patient not taking: Reported on 01/10/2015) 14 tablet 0  . ibuprofen (ADVIL,MOTRIN) 600 MG tablet Take 600 mg by mouth every 6 (six) hours as needed for pain. Reported on 01/10/2015     . predniSONE (DELTASONE) 20 MG tablet Take 1 tablet (20 mg total) by mouth 2 (two) times daily. (Patient not taking: Reported on 01/10/2015) 10 tablet 0  . Prenatal Vit-Fe Fumarate-FA (PRENATAL MULTIVITAMIN) TABS tablet Take 1 tablet by mouth daily. (Patient not taking: Reported on 01/10/2015) 90 tablet 3  . traMADol (ULTRAM) 50 MG tablet Take 1 tablet (50 mg total) by mouth every 6 (six) hours as needed. (Patient not taking: Reported on 01/10/2015) 30 tablet 0   No facility-administered medications prior to visit.    ROS Review of Systems  Constitutional: Negative for fever, chills, activity change, appetite change, fatigue and unexpected weight change.  HENT: Negative for congestion, mouth sores and sinus pressure.   Eyes: Negative for visual disturbance.  Respiratory: Negative for cough and chest tightness.   Gastrointestinal: Positive for nausea, vomiting, abdominal pain and abdominal distention. Negative for diarrhea and constipation.  Genitourinary: Negative for dysuria, frequency, hematuria, decreased urine volume, difficulty urinating and vaginal pain.  Musculoskeletal: Negative for back pain and gait problem.  Skin: Negative for pallor and rash.  Neurological: Negative for dizziness, tremors, weakness, numbness and headaches.  Psychiatric/Behavioral: Negative for confusion and sleep disturbance.    Objective:  BP 100/68 mmHg  Pulse 72  Temp(Src) 98.1 F (36.7 C) (Oral)  Wt 205 lb (92.987 kg)  SpO2 97%  BP Readings from Last 3 Encounters:  01/10/15 100/68  04/16/14 100/80  04/09/14 122/76    Wt Readings from Last 3 Encounters:  01/10/15 205  lb (92.987 kg)  04/16/14 199 lb 0.8 oz (90.288 kg)  04/09/14 199 lb 0.1 oz (90.268 kg)    Physical Exam  Constitutional: She appears well-developed. No distress.  HENT:  Head: Normocephalic.  Right Ear: External ear normal.  Left Ear: External ear normal.  Nose: Nose normal.  Mouth/Throat: Oropharynx is clear and moist.    Eyes: Conjunctivae are normal. Pupils are equal, round, and reactive to light. Right eye exhibits no discharge. Left eye exhibits no discharge.  Neck: Normal range of motion. Neck supple. No JVD present. No tracheal deviation present. No thyromegaly present.  Cardiovascular: Normal rate, regular rhythm and normal heart sounds.   Pulmonary/Chest: No stridor. No respiratory distress. She has no wheezes.  Abdominal: Soft. Bowel sounds are normal. She exhibits no distension and no mass. There is tenderness. There is no rebound and no guarding.  Musculoskeletal: She exhibits no edema or tenderness.  Lymphadenopathy:    She has no cervical adenopathy.  Neurological: She displays normal reflexes. No cranial nerve deficit. She exhibits normal muscle tone. Coordination normal.  Skin: No rash noted. She is not diaphoretic. No erythema.  Psychiatric: She has a normal mood and affect. Her behavior is normal. Judgment and thought content normal.  RLQ and suprapubi area is tender w/o rebound; no mass  Lab Results  Component Value Date   WBC 9.1 11/20/2012   HGB 11.6* 11/20/2012   HCT 34.2* 11/20/2012   PLT 215.0 11/20/2012   GLUCOSE 87 11/21/2013   CHOL 154 11/20/2012   TRIG 140.0 11/20/2012   HDL 48.10 11/20/2012   LDLCALC 78 11/20/2012   ALT 34 11/20/2012   AST 23 11/20/2012   NA 137 11/21/2013   K 4.2 11/21/2013   CL 104 11/21/2013   CREATININE 0.6 11/21/2013   BUN 11 11/21/2013   CO2 27 11/21/2013   TSH 2.67 11/21/2013   HGBA1C 5.5 11/21/2013   MICROALBUR 0.3 11/21/2013    Dg Lumbar Spine Complete  04/16/2014  CLINICAL DATA:  Low back pain. EXAM: LUMBAR SPINE - COMPLETE 4+ VIEW COMPARISON:  None. FINDINGS: Soft tissue structures are unremarkable. No acute bony abnormality identified. Normal alignment and mineralization. No evidence of fracture. Surgical clips right upper quadrant. IUD noted. IMPRESSION: No acute abnormality. Electronically Signed   By: Maisie Fus  Register   On: 04/16/2014  12:12    Assessment & Plan:   There are no diagnoses linked to this encounter. I am having Ms. Harewood maintain her ibuprofen, levonorgestrel, prenatal multivitamin, acetaminophen-codeine, traMADol, predniSONE, HYDROcodone-acetaminophen, cyclobenzaprine, levothyroxine, metFORMIN, and PARoxetine.  Meds ordered this encounter  Medications  . PARoxetine (PAXIL) 10 MG tablet    Sig: Take 10 mg by mouth daily.    Refill:  4     Follow-up: No Follow-up on file.  Sonda Primes, MD

## 2015-01-10 NOTE — Addendum Note (Signed)
Addended by: Tresa GarterPLOTNIKOV, Zamariya Neal V on: 01/10/2015 12:20 PM   Modules accepted: Orders

## 2015-01-10 NOTE — Progress Notes (Signed)
Pre visit review using our clinic review tool, if applicable. No additional management support is needed unless otherwise documented below in the visit note. 

## 2015-01-10 NOTE — Telephone Encounter (Signed)
Caller giving verbal report on 01/10/15 CT Abd/Pelvis. See in "Imaging" tab. PCP shown report. He advises ok for pt to go home. I also informed pt she has a right ovarian cyst. Not to take antibiotic that PCP gave her, but to take the Hydro/APAP and zofran as prescribed and go back to [facility] or [facility] if her pain/sumptoms worsen or persist. Pt voiced understanding.

## 2015-03-02 ENCOUNTER — Other Ambulatory Visit: Payer: Self-pay | Admitting: Internal Medicine

## 2015-03-02 DIAGNOSIS — E038 Other specified hypothyroidism: Secondary | ICD-10-CM

## 2015-03-03 NOTE — Telephone Encounter (Signed)
i have recd rx refill request for synthroid from patients pharmacy----patient would like to have dr burns as new pcp----last tsh lab was oct/2015---do you need labs and/or office visit before i refill synthroid?---please advise, thanks

## 2015-03-04 NOTE — Telephone Encounter (Signed)
tsh ordered.  rx sent to pharmacy.  She should come in, but it is ok if it is not for a few months - a physical would be ideal

## 2015-03-04 NOTE — Telephone Encounter (Signed)
Left message advising patient of dr burns note, any questions, patient can talk with tamara

## 2015-03-28 ENCOUNTER — Encounter: Payer: Self-pay | Admitting: Internal Medicine

## 2015-03-28 ENCOUNTER — Ambulatory Visit (INDEPENDENT_AMBULATORY_CARE_PROVIDER_SITE_OTHER): Payer: BLUE CROSS/BLUE SHIELD | Admitting: Internal Medicine

## 2015-03-28 ENCOUNTER — Other Ambulatory Visit (INDEPENDENT_AMBULATORY_CARE_PROVIDER_SITE_OTHER): Payer: BLUE CROSS/BLUE SHIELD

## 2015-03-28 VITALS — BP 124/82 | HR 71 | Temp 98.3°F | Resp 20 | Wt 203.0 lb

## 2015-03-28 DIAGNOSIS — E119 Type 2 diabetes mellitus without complications: Secondary | ICD-10-CM

## 2015-03-28 DIAGNOSIS — E039 Hypothyroidism, unspecified: Secondary | ICD-10-CM | POA: Diagnosis not present

## 2015-03-28 DIAGNOSIS — Z0000 Encounter for general adult medical examination without abnormal findings: Secondary | ICD-10-CM | POA: Diagnosis not present

## 2015-03-28 LAB — COMPREHENSIVE METABOLIC PANEL
ALT: 46 U/L — ABNORMAL HIGH (ref 0–35)
AST: 26 U/L (ref 0–37)
Albumin: 4.5 g/dL (ref 3.5–5.2)
Alkaline Phosphatase: 59 U/L (ref 39–117)
BUN: 9 mg/dL (ref 6–23)
CALCIUM: 9.5 mg/dL (ref 8.4–10.5)
CHLORIDE: 106 meq/L (ref 96–112)
CO2: 26 meq/L (ref 19–32)
CREATININE: 0.66 mg/dL (ref 0.40–1.20)
GFR: 105.01 mL/min (ref >60.00)
GLUCOSE: 94 mg/dL (ref 70–99)
Potassium: 4.6 mEq/L (ref 3.5–5.1)
SODIUM: 140 meq/L (ref 135–145)
Total Bilirubin: 0.5 mg/dL (ref 0.2–1.2)
Total Protein: 7.7 g/dL (ref 6.0–8.3)

## 2015-03-28 LAB — LIPID PANEL
CHOL/HDL RATIO: 4
Cholesterol: 154 mg/dL (ref 0–200)
HDL: 39.2 mg/dL (ref >39.00)
LDL CALC: 83 mg/dL (ref 0–99)
NonHDL: 114.74
TRIGLYCERIDES: 157 mg/dL — AB (ref 0.0–149.0)
VLDL: 31.4 mg/dL (ref 0.0–40.0)

## 2015-03-28 LAB — T4, FREE: FREE T4: 0.88 ng/dL (ref 0.60–1.60)

## 2015-03-28 LAB — TSH: TSH: 3.94 u[IU]/mL (ref 0.35–4.50)

## 2015-03-28 LAB — HEMOGLOBIN A1C: HEMOGLOBIN A1C: 5.6 % (ref 4.6–6.5)

## 2015-03-28 NOTE — Assessment & Plan Note (Signed)
She is likely overtreated as last HgA1c is <6. Checking HgA1c today and foot exam done. If still low will decrease metformin to daily only. Is on ACE-I. Reminded about eye exam and the need for regular exercise.

## 2015-03-28 NOTE — Progress Notes (Signed)
Pre visit review using our clinic review tool, if applicable. No additional management support is needed unless otherwise documented below in the visit note. 

## 2015-03-28 NOTE — Progress Notes (Signed)
   Subjective:    Patient ID: Jasmine Howard, female    DOB: [birth year], 40 y.o.   MRN: [id number]  HPI The patient is a 40 YO female coming in for wellness. No major concerns. Some mild eye watering and mild allergies the last couple of weeks.   [facility], social history reviewed and updated.   Review of Systems  Constitutional: Negative.   HENT: Negative.   Eyes: Negative.   Respiratory: Negative for cough, chest tightness, shortness of breath and wheezing.   Cardiovascular: Negative for chest pain, palpitations and leg swelling.  Gastrointestinal: Negative for nausea, abdominal pain, diarrhea, constipation, abdominal distention and rectal pain.  Musculoskeletal: Negative for myalgias, back pain and arthralgias.  Skin: Negative.   Neurological: Negative.   Psychiatric/Behavioral: Negative.       Objective:   Physical Exam  Constitutional: She is oriented to person, place, and time. She appears well-developed and well-nourished.  Overweight  HENT:  Head: Normocephalic and atraumatic.  Eyes: EOM are normal.  Neck: Normal range of motion.  Cardiovascular: Normal rate and regular rhythm.   Pulmonary/Chest: Effort normal and breath sounds normal. No respiratory distress. She has no wheezes. She has no rales.  Abdominal: Soft. Bowel sounds are normal. She exhibits no distension. There is no tenderness. There is no rebound.  Musculoskeletal: She exhibits no edema.  Neurological: She is alert and oriented to person, place, and time. Coordination normal.  Skin: Skin is warm and dry.  Psychiatric: She has a normal mood and affect.   Filed Vitals:   03/28/15 1103  BP: 124/82  Pulse: 71  Temp: 98.3 F (36.8 C)  TempSrc: Oral  Resp: 20  Weight: 203 lb (92.08 kg)  SpO2: 97%      Assessment & Plan:

## 2015-03-28 NOTE — Assessment & Plan Note (Signed)
Checking labs today and adjust regimen as needed. Declines flu shot today and pneumonia shot. Counseled her about exercise and her weight as being limiting factors to her health.

## 2015-03-28 NOTE — Assessment & Plan Note (Signed)
Checking TSH and free T4 as none recently. Synthroid 50 mcg daily and adjust as needed.

## 2015-03-28 NOTE — Patient Instructions (Signed)
We will check the labs today and call you back about the results.   We may decrease the sugar medicine metformin to only once per day.   Keep working on adding more exercise to your day by parking father away from buildings.   Diabetes and Standards of Medical Care Diabetes is complicated. You may find that your diabetes team includes a dietitian, nurse, diabetes educator, eye doctor, and more. To help everyone know what is going on and to help you get the care you deserve, the following schedule of care was developed to help keep you on track. Below are the tests, exams, vaccines, medicines, education, and plans you will need. HbA1c test This test shows how well you have controlled your glucose over the past 2-3 months. It is used to see if your diabetes management plan needs to be adjusted.   It is performed at least 2 times a year if you are meeting treatment goals.  It is performed 4 times a year if therapy has changed or if you are not meeting treatment goals. Blood pressure test  This test is performed at every routine medical visit. The goal is less than 140/90 mm Hg for most people, but 130/80 mm Hg in some cases. Ask your health care provider about your goal. Dental exam  Follow up with the dentist regularly. Eye exam  If you are diagnosed with type 1 diabetes as a child, get an exam upon reaching the age of 28 years or older and having had diabetes for 3-5 years. Yearly eye exams are recommended after that initial eye exam.  If you are diagnosed with type 1 diabetes as an adult, get an exam within 5 years of diagnosis and then yearly.  If you are diagnosed with type 2 diabetes, get an exam as soon as possible after the diagnosis and then yearly. Foot care exam  Visual foot exams are performed at every routine medical visit. The exams check for cuts, injuries, or other problems with the feet.  You should have a complete foot exam performed every year. This exam includes an  inspection of the structure and skin of your feet, a check of the pulses in your feet, and a check of the sensation in your feet.  Type 1 diabetes: The first exam is performed 5 years after diagnosis.  Type 2 diabetes: The first exam is performed at the time of diagnosis.  Check your feet nightly for cuts, injuries, or other problems with your feet. Tell your health care provider if anything is not healing. Kidney function test (urine microalbumin)  This test is performed once a year.  Type 1 diabetes: The first test is performed 5 years after diagnosis.  Type 2 diabetes: The first test is performed at the time of diagnosis.  A serum creatinine and estimated glomerular filtration rate (eGFR) test is done once a year to assess the level of chronic kidney disease (CKD), if present. Lipid profile (cholesterol, HDL, LDL, triglycerides)  Performed every 5 years for most people.  The goal for LDL is less than 100 mg/dL. If you are at high risk, the goal is less than 70 mg/dL.  The goal for HDL is 40 mg/dL-50 mg/dL for men and 50 mg/dL-60 mg/dL for women. An HDL cholesterol of 60 mg/dL or higher gives some protection against heart disease.  The goal for triglycerides is less than 150 mg/dL. Immunizations  The flu (influenza) vaccine is recommended yearly for every person 63 months of age or  older who has diabetes.  The pneumonia (pneumococcal) vaccine is recommended for every person 12 years of age or older who has diabetes. Adults 90 years of age or older may receive the pneumonia vaccine as a series of two separate shots.  The hepatitis B vaccine is recommended for adults shortly after they have been diagnosed with diabetes.  The Tdap (tetanus, diphtheria, and pertussis) vaccine should be given:  According to normal childhood vaccination schedules, for children.  Every 10 years, for adults who have diabetes. Diabetes self-management education  Education is recommended at diagnosis  and ongoing as needed. Treatment plan  Your treatment plan is reviewed at every medical visit.   This information is not intended to replace advice given to you by your health care provider. Make sure you discuss any questions you have with your health care provider.   Document Released: 11/08/2008 Document Revised: 02/01/2014 Document Reviewed: 06/13/2012 Elsevier Interactive Patient Education [employer].

## 2015-06-06 ENCOUNTER — Other Ambulatory Visit: Payer: Self-pay | Admitting: Internal Medicine

## 2015-06-18 ENCOUNTER — Ambulatory Visit (INDEPENDENT_AMBULATORY_CARE_PROVIDER_SITE_OTHER): Payer: BLUE CROSS/BLUE SHIELD | Admitting: Internal Medicine

## 2015-06-18 ENCOUNTER — Encounter: Payer: Self-pay | Admitting: Internal Medicine

## 2015-06-18 VITALS — BP 110/70 | HR 86 | Temp 99.6°F | Resp 18 | Ht 62.0 in | Wt 190.8 lb

## 2015-06-18 DIAGNOSIS — J019 Acute sinusitis, unspecified: Secondary | ICD-10-CM | POA: Insufficient documentation

## 2015-06-18 DIAGNOSIS — J0110 Acute frontal sinusitis, unspecified: Secondary | ICD-10-CM

## 2015-06-18 MED ORDER — AMOXICILLIN-POT CLAVULANATE 875-125 MG PO TABS: 1.0000 | ORAL_TABLET | Freq: Two times a day (BID) | ORAL | Status: DC

## 2015-06-18 NOTE — Assessment & Plan Note (Signed)
Rx for augmentin and advised to start taking zyrtec. Return for no improvement, note for work given and advised to not return until fever free for 24 hours.

## 2015-06-18 NOTE — Progress Notes (Signed)
   Subjective:    Patient ID: Jasmine Howard, female    DOB: 1975-11-13, 40 y.o.   MRN: [id number]  HPI The patient is a  YO female coming in for sinus pain and drainage. Started about 10 days ago and having fevers and chills since that time. She is taking OTC tylenol and mucinex but did not know what to try. Getting headaches and pain in her jaw and face. Nasal drainage which is yellow. Some mild cough with production. No muscle pain, nausea, vomiting, or stomach pain. Overall worsening and not able to work yesterday.   Review of Systems  Constitutional: Positive for fever, chills, activity change and appetite change. Negative for unexpected weight change.  HENT: Positive for congestion, postnasal drip, rhinorrhea and sinus pressure. Negative for dental problem, ear discharge, ear pain, sore throat and trouble swallowing.   Eyes: Negative.   Respiratory: Positive for cough and shortness of breath. Negative for chest tightness and wheezing.   Cardiovascular: Negative for chest pain, palpitations and leg swelling.  Gastrointestinal: Negative for nausea, abdominal pain, diarrhea, constipation, abdominal distention and rectal pain.  Musculoskeletal: Negative for myalgias, back pain and arthralgias.      Objective:   Physical Exam  Constitutional: She is oriented to person, place, and time. She appears well-developed and well-nourished.  Overweight  HENT:  Head: Normocephalic and atraumatic.  Right Ear: External ear normal.  Left Ear: External ear normal.  Sinus pressure and pain, nasal crusting and redness with drainage of the oropharynx.   Eyes: EOM are normal.  Neck: Normal range of motion.  Cardiovascular: Normal rate and regular rhythm.   Pulmonary/Chest: Effort normal and breath sounds normal. No respiratory distress. She has no wheezes. She has no rales.  Abdominal: Soft. Bowel sounds are normal. She exhibits no distension. There is no tenderness. There is no rebound.    Lymphadenopathy:    She has cervical adenopathy.  Neurological: She is alert and oriented to person, place, and time. Coordination normal.  Skin: Skin is warm and dry.   Filed Vitals:   06/18/15 1011  BP: 110/70  Pulse: 86  Temp: 99.6 F (37.6 C)  TempSrc: Oral  Resp: 18  Height: 5\' 2"  (1.575 m)  Weight: 190 lb 12.8 oz (86.546 kg)  SpO2: 98%      Assessment & Plan:

## 2015-06-18 NOTE — Progress Notes (Signed)
Pre visit review using our clinic review tool, if applicable. No additional management support is needed unless otherwise documented below in the visit note. 

## 2015-06-18 NOTE — Patient Instructions (Signed)
We would like you to start taking zyrtec (also called cetirizine and getting the generic brand is fine) 10 mg daily for the sinuses to help it get better faster. Take this for the next 1-2 weeks.   We have sent in an antibiotic called augmentin which is 1 pill twice a day until gone (10 days) to help clear the sinuses. Make sure to take all the medicine even if you are feeling better sooner.   It is okay to go to work as long as you do not have a fever.   Sinusitis, Adult Sinusitis is redness, soreness, and inflammation of the paranasal sinuses. Paranasal sinuses are air pockets within the bones of your face. They are located beneath your eyes, in the middle of your forehead, and above your eyes. In healthy paranasal sinuses, mucus is able to drain out, and air is able to circulate through them by way of your nose. However, when your paranasal sinuses are inflamed, mucus and air can become trapped. This can allow bacteria and other germs to grow and cause infection. Sinusitis can develop quickly and last only a short time (acute) or continue over a long period (chronic). Sinusitis that lasts for more than 12 weeks is considered chronic. CAUSES Causes of sinusitis include:  Allergies.  Structural abnormalities, such as displacement of the cartilage that separates your nostrils (deviated septum), which can decrease the air flow through your nose and sinuses and affect sinus drainage.  Functional abnormalities, such as when the small hairs (cilia) that line your sinuses and help remove mucus do not work properly or are not present. SIGNS AND SYMPTOMS Symptoms of acute and chronic sinusitis are the same. The primary symptoms are pain and pressure around the affected sinuses. Other symptoms include:  Upper toothache.  Earache.  Headache.  Bad breath.  Decreased sense of smell and taste.  A cough, which worsens when you are lying flat.  Fatigue.  Fever.  Thick drainage from your nose,  which often is green and may contain pus (purulent).  Swelling and warmth over the affected sinuses. DIAGNOSIS Your health care provider will perform a physical exam. During your exam, your health care provider may perform any of the following to help determine if you have acute sinusitis or chronic sinusitis:  Look in your nose for signs of abnormal growths in your nostrils (nasal polyps).  Tap over the affected sinus to check for signs of infection.  View the inside of your sinuses using an imaging device that has a light attached (endoscope). If your health care provider suspects that you have chronic sinusitis, one or more of the following tests may be recommended:  Allergy tests.  Nasal culture. A sample of mucus is taken from your nose, sent to a lab, and screened for bacteria.  Nasal cytology. A sample of mucus is taken from your nose and examined by your health care provider to determine if your sinusitis is related to an allergy. TREATMENT Most cases of acute sinusitis are related to a viral infection and will resolve on their own within 10 days. Sometimes, medicines are prescribed to help relieve symptoms of both acute and chronic sinusitis. These may include pain medicines, decongestants, nasal steroid sprays, or saline sprays. However, for sinusitis related to a bacterial infection, your health care provider will prescribe antibiotic medicines. These are medicines that will help kill the bacteria causing the infection. Rarely, sinusitis is caused by a fungal infection. In these cases, your health care provider will prescribe  antifungal medicine. For some cases of chronic sinusitis, surgery is needed. Generally, these are cases in which sinusitis recurs more than 3 times per year, despite other treatments. HOME CARE INSTRUCTIONS  Drink plenty of water. Water helps thin the mucus so your sinuses can drain more easily.  Use a humidifier.  Inhale steam 3-4 times a day (for  example, sit in the bathroom with the shower running).  Apply a warm, moist washcloth to your face 3-4 times a day, or as directed by your health care provider.  Use saline nasal sprays to help moisten and clean your sinuses.  Take medicines only as directed by your health care provider.  If you were prescribed either an antibiotic or antifungal medicine, finish it all even if you start to feel better. SEEK IMMEDIATE MEDICAL CARE IF:  You have increasing pain or severe headaches.  You have nausea, vomiting, or drowsiness.  You have swelling around your face.  You have vision problems.  You have a stiff neck.  You have difficulty breathing.   This information is not intended to replace advice given to you by your health care provider. Make sure you discuss any questions you have with your health care provider.   Document Released: 01/11/2005 Document Revised: 02/01/2014 Document Reviewed: 01/26/2011 Elsevier Interactive Patient Education [employer]

## 2015-09-09 ENCOUNTER — Other Ambulatory Visit: Payer: Self-pay | Admitting: Family

## 2015-09-12 ENCOUNTER — Other Ambulatory Visit: Payer: Self-pay | Admitting: *Deleted

## 2015-09-12 MED ORDER — LEVOTHYROXINE SODIUM 50 MCG PO TABS: 50.0000 ug | ORAL_TABLET | Freq: Every day | ORAL | 2 refills | Status: DC

## 2015-09-12 NOTE — Telephone Encounter (Signed)
Rec'd call pt states [facility] no longer uses [facility] need [facility]ill on [facility]ine sent to [facility]. Sent rx to [facility].Marland Kitchen.Raechel Chute/lmb

## 2015-11-21 ENCOUNTER — Encounter: Payer: Self-pay | Admitting: Internal Medicine

## 2015-11-21 ENCOUNTER — Ambulatory Visit (INDEPENDENT_AMBULATORY_CARE_PROVIDER_SITE_OTHER): Payer: Self-pay | Admitting: Internal Medicine

## 2015-11-21 DIAGNOSIS — R030 Elevated blood-pressure reading, without diagnosis of hypertension: Secondary | ICD-10-CM

## 2015-11-21 MED ORDER — AMLODIPINE BESYLATE 5 MG PO TABS: 5.0000 mg | ORAL_TABLET | Freq: Every day | ORAL | 3 refills | Status: DC

## 2015-11-21 NOTE — Assessment & Plan Note (Signed)
Rx for amlodipine for if she has more episodes of hypertension. She has strong family history of htn. She is asked to stop sodas and decrease salt back to her normal and see if she has more episodes.

## 2015-11-21 NOTE — Progress Notes (Signed)
   Subjective:    Patient ID: Jasmine Howard, female    DOB: 1975-05-01, 40 y.o.   MRN: [id number]  HPI The patient is a 40 YO female coming in for high blood pressure at the dentist. She is not normally on blood pressure medication. Since that time she has had multiple episodes where it is high and she gets headache and feels nauseous and bad for days with high blood pressure. She does not recall the numbers. She has not been on medicine for blood pressure before. Strong family history of blood pressure problems. She is drinking more coke and salt recently but that is the only change. No new medicines or over the counter medicines. Currently she is feeling well since yesterday.   Review of Systems  Constitutional: Positive for activity change. Negative for appetite change, fatigue, fever and unexpected weight change.  Respiratory: Negative for cough, shortness of breath and wheezing.   Cardiovascular: Negative.   Gastrointestinal: Positive for nausea. Negative for abdominal distention, abdominal pain, constipation and diarrhea.  Musculoskeletal: Negative.   Skin: Negative.   Neurological: Positive for light-headedness and headaches. Negative for dizziness, tremors, seizures, speech difficulty and weakness.      Objective:   Physical Exam  Constitutional: She is oriented to person, place, and time. She appears well-developed and well-nourished.  HENT:  Head: Normocephalic and atraumatic.  Eyes: EOM are normal.  Cardiovascular: Normal rate and regular rhythm.   Pulmonary/Chest: Effort normal and breath sounds normal.  Abdominal: Soft. She exhibits no distension. There is no tenderness. There is no rebound.  Neurological: She is alert and oriented to person, place, and time.  Skin: Skin is warm and dry.   Vitals:   11/21/15 0834  BP: 112/70  Pulse: 66  Resp: 12  Temp: 98.3 F (36.8 C)  TempSrc: Oral  SpO2: 98%  Weight: 196 lb (88.9 kg)  Height: 5\' 2"  (1.575 m)      Assessment  & Plan:

## 2015-11-21 NOTE — Patient Instructions (Signed)
Work on decreasing the cokes and the salt in the diet.   Think about doing some stairs for exercise in your day to help as well with the pressure.   The pressure goal is <140 for the top number and <90 for the bottom number.   We have sent in amlodipine that you can take (1 pill daily) if needed if you start feeling bad again. Call us if you are needing to take it more than 3 days per week.  DASH Eating Plan DASH stands for "Dietary Approaches to Stop Hypertension." The DASH eating plan is a healthy eating plan that has been shown to reduce high blood pressure (hypertension). Additional health benefits may include reducing the risk of type 2 diabetes mellitus, heart disease, and stroke. The DASH eating plan may also help with weight loss. WHAT DO I NEED TO KNOW ABOUT THE DASH EATING PLAN? For the DASH eating plan, you will follow these general guidelines:  Choose foods with a percent daily value for sodium of less than 5% (as listed on the food label).  Use salt-free seasonings or herbs instead of table salt or sea salt.  Check with your health care provider or pharmacist before using salt substitutes.  Eat lower-sodium products, often labeled as "lower sodium" or "no salt added."  Eat fresh foods.  Eat more vegetables, fruits, and low-fat dairy products.  Choose whole grains. Look for the word "whole" as the first word in the ingredient list.  Choose fish and skinless chicken or [place] more often than red meat. Limit fish, poultry, and meat to 6 oz (170 g) each day.  Limit sweets, desserts, sugars, and sugary drinks.  Choose heart-healthy fats.  Limit cheese to 1 oz (28 g) per day.  Eat more home-cooked food and less restaurant, buffet, and fast food.  Limit fried foods.  Cook foods using methods other than frying.  Limit canned vegetables. If you do use them, rinse them well to decrease the sodium.  When eating at a restaurant, ask that your food be prepared with less  salt, or no salt if possible. WHAT FOODS CAN I EAT? Seek help from a dietitian for individual calorie needs. Grains Whole grain or whole wheat bread. Brown rice. Whole grain or whole wheat pasta. Quinoa, bulgur, and whole grain cereals. Low-sodium cereals. Corn or whole wheat flour tortillas. Whole grain cornbread. Whole grain crackers. Low-sodium crackers. Vegetables Fresh or frozen vegetables (raw, steamed, roasted, or grilled). Low-sodium or reduced-sodium tomato and vegetable juices. Low-sodium or reduced-sodium tomato sauce and paste. Low-sodium or reduced-sodium canned vegetables.  Fruits All fresh, canned (in natural juice), or frozen fruits. Meat and Other Protein Products Ground beef (85% or leaner), grass-fed beef, or beef trimmed of fat. Skinless chicken or [place]key. Ground chicken or [place]key. Pork trimmed of fat. All fish and seafood. Eggs. Dried beans, peas, or lentils. Unsalted nuts and seeds. Unsalted canned beans. Dairy Low-fat dairy products, such as skim or 1% milk, 2% or reduced-fat cheeses, low-fat ricotta or cottage cheese, or plain low-fat yogurt. Low-sodium or reduced-sodium cheeses. Fats and Oils Tub margarines without trans fats. Light or reduced-fat mayonnaise and salad dressings (reduced sodium). Avocado. Safflower, olive, or canola oils. Natural peanut or almond butter. Other Unsalted popcorn and pretzels. The items listed above may not be a complete list of recommended foods or beverages. Contact your dietitian for more options. WHAT FOODS ARE NOT RECOMMENDED? Grains White bread. White pasta. White rice. Refined cornbread. Bagels and croissants. Crackers that contain trans  fat. Vegetables Creamed or fried vegetables. Vegetables in a cheese sauce. Regular canned vegetables. Regular canned tomato sauce and paste. Regular tomato and vegetable juices. Fruits Dried fruits. Canned fruit in light or heavy syrup. Fruit juice. Meat and Other Protein Products Fatty cuts of  meat. Ribs, chicken wings, bacon, sausage, bologna, salami, chitterlings, fatback, hot dogs, bratwurst, and packaged luncheon meats. Salted nuts and seeds. Canned beans with salt. Dairy Whole or 2% milk, cream, half-and-half, and cream cheese. Whole-fat or sweetened yogurt. Full-fat cheeses or blue cheese. Nondairy creamers and whipped toppings. Processed cheese, cheese spreads, or cheese curds. Condiments Onion and garlic salt, seasoned salt, table salt, and sea salt. Canned and packaged gravies. Worcestershire sauce. Tartar sauce. Barbecue sauce. Teriyaki sauce. Soy sauce, including reduced sodium. Steak sauce. Fish sauce. Oyster sauce. Cocktail sauce. Horseradish. Ketchup and mustard. Meat flavorings and tenderizers. Bouillon cubes. Hot sauce. Tabasco sauce. Marinades. Taco seasonings. Relishes. Fats and Oils Butter, stick margarine, lard, shortening, ghee, and bacon fat. Coconut, palm kernel, or palm oils. Regular salad dressings. Other Pickles and olives. Salted popcorn and pretzels. The items listed above may not be a complete list of foods and beverages to avoid. Contact your dietitian for more information. WHERE CAN I FIND MORE INFORMATION? National Heart, Lung, and Blood Institute: [employer]   This information is not intended to replace advice given to you by your health care provider. Make sure you discuss any questions you have with your health care provider.   Document Released: 12/31/2010 Document Revised: 02/01/2014 Document Reviewed: 11/15/2012 Elsevier Interactive Patient Education [employer]

## 2015-11-21 NOTE — Progress Notes (Signed)
Pre visit review using our clinic review tool, if applicable. No additional management support is needed unless otherwise documented below in the visit note. 

## 2016-06-11 ENCOUNTER — Encounter: Payer: Self-pay | Admitting: Internal Medicine

## 2016-06-11 LAB — HM DIABETES EYE EXAM

## 2016-06-11 NOTE — Progress Notes (Unsigned)
Results entered and sent to scan  

## 2016-07-21 ENCOUNTER — Encounter (INDEPENDENT_AMBULATORY_CARE_PROVIDER_SITE_OTHER): Payer: Self-pay | Admitting: Physician Assistant

## 2016-07-21 ENCOUNTER — Ambulatory Visit (INDEPENDENT_AMBULATORY_CARE_PROVIDER_SITE_OTHER): Payer: Self-pay | Admitting: Physician Assistant

## 2016-07-21 VITALS — BP 114/78 | HR 73 | Temp 98.1°F | Ht 64.0 in | Wt 203.2 lb

## 2016-07-21 DIAGNOSIS — Z131 Encounter for screening for diabetes mellitus: Secondary | ICD-10-CM

## 2016-07-21 DIAGNOSIS — E119 Type 2 diabetes mellitus without complications: Secondary | ICD-10-CM

## 2016-07-21 DIAGNOSIS — R600 Localized edema: Secondary | ICD-10-CM

## 2016-07-21 DIAGNOSIS — E039 Hypothyroidism, unspecified: Secondary | ICD-10-CM

## 2016-07-21 DIAGNOSIS — Z760 Encounter for issue of repeat prescription: Secondary | ICD-10-CM

## 2016-07-21 LAB — POCT GLYCOSYLATED HEMOGLOBIN (HGB A1C): Hemoglobin A1C: 5.6

## 2016-07-21 MED ORDER — METFORMIN HCL ER 500 MG PO TB24: 500.0000 mg | ORAL_TABLET | Freq: Two times a day (BID) | ORAL | 3 refills | Status: DC

## 2016-07-21 MED ORDER — PAROXETINE HCL 10 MG PO TABS: 10.0000 mg | ORAL_TABLET | Freq: Every day | ORAL | 1 refills | Status: DC

## 2016-07-21 NOTE — Patient Instructions (Signed)
Edema (Edema) Un edema es una acumulacin anormal de lquidos. Es ms frecuente en las piernas y los muslos. La hinchazn indolora de pies y tobillos es ms probable a medida que una persona envejece. Tambin es comn en la piel ms floja, como alrededor [employer] los ojos. CUIDADOS EN EL HOGAR  Mantenga la parte afectada del cuerpo por encima del nivel del corazn mientras est recostado.  No se quede quieto ni permanezca de pie durante Con-waymucho tiempo.  No coloque nada exactamente debajo de las rodillas al recostarse.  No use ropa ajustada en los muslos.  Ejercite las piernas para ayudar a que la inflamacin (hinchazn) disminuya.  Use vendajes elsticos o medias de compresin segn las indicaciones del mdico.  Una dieta con bajo contenido de sal puede ayudar a disminuir la hinchazn.  Solo tome los [place] Energymedicamentos que le haya indicado su mdico.  SOLICITE AYUDA SI:  El tratamiento no funciona.  Tiene una enfermedad cardaca, heptica o renal, y nota que la piel parece hinchada o tiene aspecto brillante.  Tiene hinchazn en las piernas que no mejora cuando las eleva.  Ha aumentado sbitamente de peso sin ningn motivo.  SOLICITE AYUDA DE INMEDIATO SI:  Tiene dificultad para respirar o le duele el pecho.  No puede respirar cuando se acuesta.  Las reas hinchadas presentan dolor, enrojecimiento o Airline pilotcalor.  Tiene una enfermedad cardaca, heptica o renal, y le aparece un edema de repente.  Tiene fiebre y los sntomas empeoran de manera sbita.  ASEGRESE DE QUE:  Comprende estas instrucciones.  Controlar su afeccin.  Recibir ayuda de inmediato si no mejora o si empeora.  Esta informacin no tiene Theme park managercomo fin reemplazar el consejo del mdico. Asegrese de hacerle al mdico cualquier pregunta que tenga. Document Released: 11/01/2012 Document Revised: 01/16/2013 Document Reviewed: 11/03/2012 Elsevier Interactive Patient Education  2017 [employer]

## 2016-07-21 NOTE — Progress Notes (Signed)
Subjective:  Patient ID: Jasmine Howard, female    DOB: 1975/10/17  Age: 41 y.o. MRN: [id number]  CC: thyroid and glucose checks  HPI Jasmine Howard is a 41 y.o. female with a PMH of depression, DM2, hypothyroidism, IBS, PCOS, and anemia presents to have her glucose and thyroid levels checked. Says she is taking medications as prescribed. Reports LE edema at times, usually when standing for prolonged periods. No LE currently. Feels well and has no other complaints or symptoms.     Outpatient Medications Prior to Visit  Medication Sig Dispense Refill  . levonorgestrel (MIRENA) 20 MCG/24HR IUD 1 Intra Uterine Device (1 each total) by Intrauterine route once. 1 each 0  . levothyroxine (SYNTHROID, LEVOTHROID) 50 MCG tablet Take 1 tablet (50 mcg total) by mouth daily before breakfast. 90 tablet 2  . metFORMIN (GLUCOPHAGE-XR) 500 MG 24 hr tablet TAKE 1 TABLET TWICE A DAY WITH MEALS 180 tablet 2  . PARoxetine (PAXIL) 10 MG tablet Take 10 mg by mouth daily.  4  . amLODipine (NORVASC) 5 MG tablet Take 1 tablet (5 mg total) by mouth daily. (Patient not taking: Reported on 07/21/2016) 30 tablet 3   No facility-administered medications prior to visit.      ROS Review of Systems  Constitutional: Negative for chills, fever and malaise/fatigue.  Eyes: Negative for blurred vision.  Respiratory: Negative for shortness of breath.   Cardiovascular: Positive for leg swelling. Negative for chest pain and palpitations.  Gastrointestinal: Negative for abdominal pain and nausea.  Genitourinary: Negative for dysuria and hematuria.  Musculoskeletal: Negative for joint pain and myalgias.  Skin: Negative for rash.  Neurological: Negative for tingling and headaches.  Psychiatric/Behavioral: Negative for depression. The patient is not nervous/anxious.     Objective:  BP 114/78 (BP Location: Left Arm, Patient Position: Sitting, Cuff Size: Large)   Pulse 73   Temp 98.1 F (36.7 C) (Oral)   Ht 5\' 4"  (1.626  m)   Wt 203 lb 3.2 oz (92.2 kg)   SpO2 99%   BMI 34.88 kg/m   BP/Weight 07/21/2016 11/21/2015 06/18/2015  Systolic BP 114 112 110  Diastolic BP 78 70 70  Wt. (Lbs) 203.2 196 190.8  BMI 34.88 35.85 34.89      Physical Exam  Constitutional: She is oriented to person, place, and time.  Well developed, well nourished, NAD, polite  HENT:  Head: Normocephalic and atraumatic.  Eyes: No scleral icterus.  Neck: Normal range of motion. Neck supple. No thyromegaly present.  Cardiovascular: Normal rate, regular rhythm and normal heart sounds.   Pulmonary/Chest: Effort normal and breath sounds normal.  Musculoskeletal: She exhibits no edema.  Neurological: She is alert and oriented to person, place, and time.  Skin: Skin is warm and dry. No rash noted. No erythema. No pallor.  Psychiatric: She has a normal mood and affect. Her behavior is normal. Thought content normal.  Vitals reviewed.    Assessment & Plan:   1. Hypothyroidism, unspecified type - Thyroid Panel With TSH  2. Type II diabetes mellitus, well controlled (HCC) - HgB A1c 5.6% in clinic today - Microalbumin/Creatinine Ratio, Urine - Refill metFORMIN (GLUCOPHAGE-XR) 500 MG 24 hr tablet; Take 1 tablet (500 mg total) by mouth 2 (two) times daily with a meal.  Dispense: 180 tablet; Refill: 3  3. Lower extremity edema - CBC with Differential - Comprehensive metabolic panel  4. Medication refill - refill PARoxetine (PAXIL) 10 MG tablet; Take 1 tablet (10 mg total) by mouth daily.  Dispense: 90 tablet; Refill: 1   Meds ordered this encounter  Medications  . metFORMIN (GLUCOPHAGE-XR) 500 MG 24 hr tablet    Sig: Take 1 tablet (500 mg total) by mouth 2 (two) times daily with a meal.    Dispense:  180 tablet    Refill:  3    Order Specific Question:   Supervising Provider    Answer:   Quentin AngstJEGEDE, OLUGBEMIGA E [id number]]  . PARoxetine (PAXIL) 10 MG tablet    Sig: Take 1 tablet (10 mg total) by mouth daily.    Dispense:  90  tablet    Refill:  1    Order Specific Question:   Supervising Provider    Answer:   Quentin AngstJEGEDE, OLUGBEMIGA E [[id number]]    Follow-up: Return in about 4 weeks (around 08/18/2016) for full physical .   Loletta Specteroger David Markies Mowatt PA

## 2016-07-22 ENCOUNTER — Telehealth (INDEPENDENT_AMBULATORY_CARE_PROVIDER_SITE_OTHER): Payer: Self-pay

## 2016-07-22 ENCOUNTER — Other Ambulatory Visit (INDEPENDENT_AMBULATORY_CARE_PROVIDER_SITE_OTHER): Payer: Self-pay | Admitting: Physician Assistant

## 2016-07-22 DIAGNOSIS — E039 Hypothyroidism, unspecified: Secondary | ICD-10-CM

## 2016-07-22 LAB — COMPREHENSIVE METABOLIC PANEL
ALK PHOS: 63 IU/L (ref 39–117)
ALT: 31 IU/L (ref 0–32)
AST: 22 IU/L (ref 0–40)
Albumin/Globulin Ratio: 1.4 (ref 1.2–2.2)
Albumin: 4.5 g/dL (ref 3.5–5.5)
BUN / CREAT RATIO: 17 (ref 9–23)
BUN: 10 mg/dL (ref 6–24)
Bilirubin Total: 0.3 mg/dL (ref 0.0–1.2)
CALCIUM: 9.5 mg/dL (ref 8.7–10.2)
CO2: 22 mmol/L (ref 20–29)
CREATININE: 0.6 mg/dL (ref 0.57–1.00)
Chloride: 103 mmol/L (ref 96–106)
GFR, EST AFRICAN AMERICAN: 130 mL/min/{1.73_m2} (ref >59)
GFR, EST NON AFRICAN AMERICAN: 113 mL/min/{1.73_m2} (ref >59)
GLOBULIN, TOTAL: 3.2 g/dL (ref 1.5–4.5)
GLUCOSE: 91 mg/dL (ref 65–99)
Potassium: 4.4 mmol/L (ref 3.5–5.2)
SODIUM: 141 mmol/L (ref 134–144)
TOTAL PROTEIN: 7.7 g/dL (ref 6.0–8.5)

## 2016-07-22 LAB — CBC WITH DIFFERENTIAL/PLATELET
BASOS ABS: 0 10*3/uL (ref 0.0–0.2)
Basos: 0 %
EOS (ABSOLUTE): 0.2 10*3/uL (ref 0.0–0.4)
EOS: 3 %
HEMATOCRIT: 39 % (ref 34.0–46.6)
HEMOGLOBIN: 13 g/dL (ref 11.1–15.9)
IMMATURE GRANS (ABS): 0 10*3/uL (ref 0.0–0.1)
IMMATURE GRANULOCYTES: 0 %
LYMPHS ABS: 1.6 10*3/uL (ref 0.7–3.1)
LYMPHS: 27 %
MCH: 28.1 pg (ref 26.6–33.0)
MCHC: 33.3 g/dL (ref 31.5–35.7)
MCV: 84 fL (ref 79–97)
MONOCYTES: 5 %
Monocytes Absolute: 0.3 10*3/uL (ref 0.1–0.9)
Neutrophils Absolute: 3.8 10*3/uL (ref 1.4–7.0)
Neutrophils: 65 %
Platelets: 218 10*3/uL (ref 150–379)
RBC: 4.63 x10E6/uL (ref 3.77–5.28)
RDW: 14.1 % (ref 12.3–15.4)
WBC: 5.9 10*3/uL (ref 3.4–10.8)

## 2016-07-22 LAB — THYROID PANEL WITH TSH
FREE THYROXINE INDEX: 1.9 (ref 1.2–4.9)
T3 Uptake Ratio: 23 % — ABNORMAL LOW (ref 24–39)
T4, Total: 8.1 ug/dL (ref 4.5–12.0)
TSH: 4.06 u[IU]/mL (ref 0.450–4.500)

## 2016-07-22 LAB — MICROALBUMIN / CREATININE URINE RATIO
Creatinine, Urine: 58.1 mg/dL
Microalbumin, Urine: <3 ug/mL

## 2016-07-22 MED ORDER — LEVOTHYROXINE SODIUM 50 MCG PO TABS: 50.0000 ug | ORAL_TABLET | Freq: Every day | ORAL | 3 refills | Status: DC

## 2016-07-22 NOTE — Telephone Encounter (Signed)
Results given to patient. Adon Gehlhausen S Jeffery Bachmeier, CMA  

## 2016-07-27 ENCOUNTER — Ambulatory Visit: Payer: Self-pay | Attending: Physician Assistant

## 2016-08-17 ENCOUNTER — Encounter (INDEPENDENT_AMBULATORY_CARE_PROVIDER_SITE_OTHER): Payer: Self-pay | Admitting: Physician Assistant

## 2016-08-17 ENCOUNTER — Ambulatory Visit (INDEPENDENT_AMBULATORY_CARE_PROVIDER_SITE_OTHER): Payer: Self-pay | Admitting: Physician Assistant

## 2016-08-17 ENCOUNTER — Other Ambulatory Visit (HOSPITAL_COMMUNITY)
Admission: RE | Admit: 2016-08-17 | Discharge: 2016-08-17 | Disposition: A | Discharge status: Home / Self Care | Payer: Self-pay | Source: Ambulatory Visit | Attending: Physician Assistant | Admitting: Physician Assistant

## 2016-08-17 VITALS — BP 111/70 | HR 66 | Temp 98.0°F | Wt 208.6 lb

## 2016-08-17 DIAGNOSIS — Z0000 Encounter for general adult medical examination without abnormal findings: Secondary | ICD-10-CM | POA: Insufficient documentation

## 2016-08-17 DIAGNOSIS — Z124 Encounter for screening for malignant neoplasm of cervix: Secondary | ICD-10-CM | POA: Insufficient documentation

## 2016-08-17 LAB — POCT URINALYSIS DIPSTICK
BILIRUBIN UA: NEGATIVE
GLUCOSE UA: NEGATIVE
KETONES UA: NEGATIVE
LEUKOCYTES UA: NEGATIVE
NITRITE UA: NEGATIVE
PH UA: 6 (ref 5.0–8.0)
Protein, UA: NEGATIVE
RBC UA: NEGATIVE
Spec Grav, UA: <=1.005 — AB (ref 1.010–1.025)
Urobilinogen, UA: 0.2 E.U./dL

## 2016-08-17 NOTE — Patient Instructions (Signed)
Prueba de Papanicolaou  (Pap Test)  ¿POR QUÉ ME DEBO REALIZAR ESTA PRUEBA?  A esta prueba también se la denomina "frotis de Pap". Es una prueba de detección que se utiliza para detectar signos de cáncer de vagina, cuello del útero y útero. La prueba también puede identificar la presencia de infección o cambios precancerosos. El médico probablemente le recomiende que se realice esta prueba en forma regular. Esta prueba puede realizarse de la siguiente manera:  · Cada 3 años, a partir de los 21 años.  · Cada 5 años, en combinación con las pruebas que se realizan para detectar la presencia del virus del papiloma humano (VPH).  · Con mayor o menor frecuencia, en función de otras enfermedades que tenga.  ¿QUÉ TIPO DE MUESTRA SE TOMA?  El médico utilizará un pequeño hisopo de algodón, una espátula de plástico o un cepillo para recolectar una muestra de células de la superficie del cuello del útero. El cuello del útero es la apertura del útero, que también se conoce como matriz. También pueden recolectarse las secreciones del cuello del útero y la vagina.  ¿CÓMO DEBO PREPARARME PARA ESTA PRUEBA?  · Tenga en cuenta en qué etapa del ciclo menstrual se encuentra. Es posible que deba reprogramar la prueba si está menstruando el día en que debe realizársela.  · Si el día en que debe realizarse la prueba tiene una infección vaginal aparente, deberá reprogramar la prueba.  · Pueden pedirle que evite tomar una ducha o baño el día de la prueba o el día anterior.  · Algunos medicamentos pueden provocar resultados anormales de la prueba, como los digitálicos y la tetraciclina. Si toma alguno de estos medicamentos, hable con su médico antes de realizarse la prueba.  ¿QUÉ SIGNIFICAN LOS RESULTADOS?  Los resultados anormales de la prueba pueden indicar diversas enfermedades. Estas pueden incluir lo siguiente:  · Cáncer. Si bien los resultados de la prueba de Papanicolaou no pueden  utilizarse para diagnosticar cáncer de cuello del útero, de vagina o de útero, pueden indicar que existe una posibilidad de presencia de cáncer. En este caso, será necesario realizar pruebas adicionales para determinar la presencia de cáncer.  · Enfermedad de transmisión sexual.  · Infecciones por hongos.  · Infección por parásitos.  · Infección por herpes.  · Una enfermedad que causa o favorece la infertilidad.  Es su responsabilidad retirar el resultado del estudio. Consulte en el laboratorio o en el departamento en el que fue realizado el estudio cuándo y cómo podrá obtener los resultados. Comuníquese con el médico si tiene preguntas sobre los resultados.  Esta información no tiene como fin reemplazar el consejo del médico. Asegúrese de hacerle al médico cualquier pregunta que tenga.  Document Released: 06/30/2007 Document Revised: 02/01/2014 Document Reviewed: 06/04/2013  Elsevier Interactive Patient Education © 2018 Elsevier Inc.

## 2016-08-17 NOTE — Progress Notes (Signed)
Subjective:  Patient ID: Jasmine Howard, female    DOB: 25-Apr-1975  Age: 41 y.o. MRN: [id number]  CC: annual physical and PAP  HPI Jasmine Howard is a 41 y.o. female with a PMH of DM2, hypothyroidism, depression, IBS, PCOS, and fatty liver presents for an annual physical and PAP. Has been feeling well, taking medications as directed. Denies any symptoms or complaints at this time.    Outpatient Medications Prior to Visit  Medication Sig Dispense Refill  . levonorgestrel (MIRENA) 20 MCG/24HR IUD 1 Intra Uterine Device (1 each total) by Intrauterine route once. 1 each 0  . levothyroxine (SYNTHROID, LEVOTHROID) 50 MCG tablet Take 1 tablet (50 mcg total) by mouth daily before breakfast. 90 tablet 3  . metFORMIN (GLUCOPHAGE-XR) 500 MG 24 hr tablet Take 1 tablet (500 mg total) by mouth 2 (two) times daily with a meal. 180 tablet 3  . PARoxetine (PAXIL) 10 MG tablet Take 1 tablet (10 mg total) by mouth daily. 90 tablet 1   No facility-administered medications prior to visit.      ROS Review of Systems  Constitutional: Negative for chills, fever and malaise/fatigue.  Eyes: Negative for blurred vision.  Respiratory: Negative for shortness of breath.   Cardiovascular: Negative for chest pain and palpitations.  Gastrointestinal: Negative for abdominal pain and nausea.  Genitourinary: Negative for dysuria and hematuria.  Musculoskeletal: Negative for joint pain and myalgias.  Skin: Negative for rash.  Neurological: Negative for tingling and headaches.  Psychiatric/Behavioral: Negative for depression. The patient is not nervous/anxious.     Objective:  BP 111/70 (BP Location: Left Arm, Patient Position: Sitting, Cuff Size: Large)   Pulse 66   Temp 98 F (36.7 C) (Oral)   Wt 208 lb 9.6 oz (94.6 kg)   SpO2 99%   BMI 35.81 kg/m   BP/Weight 08/17/2016 07/21/2016 11/21/2015  Systolic BP 111 114 112  Diastolic BP 70 78 70  Wt. (Lbs) 208.6 203.2 196  BMI 35.81 34.88 35.85       Physical Exam  Constitutional: She is oriented to person, place, and time.  Well developed, overweight, NAD, polite  HENT:  Head: Normocephalic and atraumatic.  Mouth/Throat: No oropharyngeal exudate.  TMs normal.  Eyes: Pupils are equal, round, and reactive to light. Conjunctivae and EOM are normal. No scleral icterus.  Neck: Normal range of motion. Neck supple. No thyromegaly present.  Cardiovascular: Normal rate, regular rhythm and normal heart sounds.   Pulmonary/Chest: Effort normal and breath sounds normal. No respiratory distress.  Abdominal: Soft. Bowel sounds are normal. She exhibits no distension and no mass. There is no tenderness.  No hepatosplenomegaly  Genitourinary:  Genitourinary Comments: Vagina with thick, whitish/yellowish discharge, cervix with nabothian cyst, no cervical motion tenderness, no IUD strings visible, no adnexal mass/tenderness bilaterally, no uterine mass/tenderness.  Musculoskeletal: She exhibits no edema.  LEs, UEs, and back with full aROM.  Lymphadenopathy:    She has no cervical adenopathy.  Neurological: She is alert and oriented to person, place, and time. She has normal reflexes. No cranial nerve deficit. Coordination normal.  Strength 5/5 throughout.  Skin: Skin is warm and dry. No rash noted. No erythema. No pallor.  Psychiatric: She has a normal mood and affect. Her behavior is normal. Thought content normal.  Vitals reviewed.    Assessment & Plan:   1. Annual physical exam - Cytology - PAP [place]  2. Screening for cervical cancer - Urinalysis Dipstick - Comprehensive metabolic panel - CBC with Differential/Platelet; Future -  Lipid Panel     Follow-up: 6 months for DM2   Loletta Specter PA

## 2016-08-18 ENCOUNTER — Telehealth (INDEPENDENT_AMBULATORY_CARE_PROVIDER_SITE_OTHER): Payer: Self-pay | Admitting: Physician Assistant

## 2016-08-18 LAB — CBC WITH DIFFERENTIAL/PLATELET
BASOS ABS: 0 10*3/uL (ref 0.0–0.2)
BASOS: 0 %
EOS (ABSOLUTE): 0.1 10*3/uL (ref 0.0–0.4)
Eos: 2 %
Hematocrit: 36 % (ref 34.0–46.6)
Hemoglobin: 12.5 g/dL (ref 11.1–15.9)
IMMATURE GRANS (ABS): 0 10*3/uL (ref 0.0–0.1)
IMMATURE GRANULOCYTES: 0 %
LYMPHS: 25 %
Lymphocytes Absolute: 1.8 10*3/uL (ref 0.7–3.1)
MCH: 28.5 pg (ref 26.6–33.0)
MCHC: 34.7 g/dL (ref 31.5–35.7)
MCV: 82 fL (ref 79–97)
Monocytes Absolute: 0.6 10*3/uL (ref 0.1–0.9)
Monocytes: 8 %
NEUTROS PCT: 65 %
Neutrophils Absolute: 4.6 10*3/uL (ref 1.4–7.0)
PLATELETS: 197 10*3/uL (ref 150–379)
RBC: 4.39 x10E6/uL (ref 3.77–5.28)
RDW: 14 % (ref 12.3–15.4)
WBC: 7.1 10*3/uL (ref 3.4–10.8)

## 2016-08-18 LAB — COMPREHENSIVE METABOLIC PANEL
A/G RATIO: 1.6 (ref 1.2–2.2)
ALBUMIN: 4.5 g/dL (ref 3.5–5.5)
ALK PHOS: 68 IU/L (ref 39–117)
ALT: 26 IU/L (ref 0–32)
AST: 17 IU/L (ref 0–40)
BUN / CREAT RATIO: 16 (ref 9–23)
BUN: 10 mg/dL (ref 6–24)
Bilirubin Total: 0.3 mg/dL (ref 0.0–1.2)
CO2: 24 mmol/L (ref 20–29)
CREATININE: 0.61 mg/dL (ref 0.57–1.00)
Calcium: 9.2 mg/dL (ref 8.7–10.2)
Chloride: 103 mmol/L (ref 96–106)
GFR calc Af Amer: 129 mL/min/{1.73_m2} (ref >59)
GFR calc non Af Amer: 112 mL/min/{1.73_m2} (ref >59)
GLOBULIN, TOTAL: 2.8 g/dL (ref 1.5–4.5)
Glucose: 96 mg/dL (ref 65–99)
Potassium: 4.8 mmol/L (ref 3.5–5.2)
SODIUM: 141 mmol/L (ref 134–144)
Total Protein: 7.3 g/dL (ref 6.0–8.5)

## 2016-08-18 LAB — LIPID PANEL
CHOLESTEROL TOTAL: 142 mg/dL (ref 100–199)
Chol/HDL Ratio: 3.2 ratio (ref 0.0–4.4)
HDL: 45 mg/dL (ref >39)
LDL CALC: 63 mg/dL (ref 0–99)
Triglycerides: 168 mg/dL — ABNORMAL HIGH (ref 0–149)
VLDL Cholesterol Cal: 34 mg/dL (ref 5–40)

## 2016-08-18 NOTE — Telephone Encounter (Signed)
Pt called back to review results.... °

## 2016-08-20 LAB — CYTOLOGY - PAP
BACTERIAL VAGINITIS: NEGATIVE
CANDIDA VAGINITIS: NEGATIVE
Chlamydia: NEGATIVE
Diagnosis: NEGATIVE
NEISSERIA GONORRHEA: NEGATIVE
TRICH (WINDOWPATH): NEGATIVE

## 2016-08-24 ENCOUNTER — Ambulatory Visit (HOSPITAL_COMMUNITY)
Admission: RE | Admit: 2016-08-24 | Discharge: 2016-08-24 | Disposition: A | Discharge status: Home / Self Care | Payer: Self-pay | Source: Ambulatory Visit | Attending: Physician Assistant | Admitting: Physician Assistant

## 2016-08-24 DIAGNOSIS — Z975 Presence of (intrauterine) contraceptive device: Secondary | ICD-10-CM | POA: Insufficient documentation

## 2016-08-24 DIAGNOSIS — Z0000 Encounter for general adult medical examination without abnormal findings: Secondary | ICD-10-CM | POA: Insufficient documentation

## 2016-09-18 LAB — GLUCOSE, POCT (MANUAL RESULT ENTRY): POC Glucose: 105 mg/dl — AB (ref 70–99)

## 2016-11-22 ENCOUNTER — Other Ambulatory Visit: Payer: Self-pay | Admitting: Internal Medicine

## 2016-11-22 NOTE — Telephone Encounter (Signed)
Routing to PCP   Please advise.

## 2017-09-13 ENCOUNTER — Other Ambulatory Visit (INDEPENDENT_AMBULATORY_CARE_PROVIDER_SITE_OTHER): Payer: Self-pay | Admitting: Physician Assistant

## 2017-09-13 DIAGNOSIS — Z760 Encounter for issue of repeat prescription: Secondary | ICD-10-CM

## 2017-09-13 NOTE — Telephone Encounter (Signed)
FWD to PCP. [name] [name], CMA  

## 2017-12-09 ENCOUNTER — Other Ambulatory Visit: Payer: Self-pay | Admitting: Physician Assistant

## 2017-12-09 NOTE — Telephone Encounter (Signed)
FWD to PCP.  S , CMA  

## 2020-10-21 ENCOUNTER — Other Ambulatory Visit: Payer: Self-pay

## 2020-10-21 ENCOUNTER — Encounter: Payer: Self-pay | Admitting: Emergency Medicine

## 2020-10-21 ENCOUNTER — Ambulatory Visit (INDEPENDENT_AMBULATORY_CARE_PROVIDER_SITE_OTHER): Payer: 59 | Admitting: Emergency Medicine

## 2020-10-21 VITALS — BP 130/80 | HR 67 | Temp 98.0°F | Ht 64.0 in | Wt 218.0 lb

## 2020-10-21 DIAGNOSIS — E1165 Type 2 diabetes mellitus with hyperglycemia: Secondary | ICD-10-CM

## 2020-10-21 DIAGNOSIS — E039 Hypothyroidism, unspecified: Secondary | ICD-10-CM | POA: Diagnosis not present

## 2020-10-21 DIAGNOSIS — Z7689 Persons encountering health services in other specified circumstances: Secondary | ICD-10-CM

## 2020-10-21 LAB — LIPID PANEL
Cholesterol: 165 mg/dL (ref 0–200)
HDL: 49.4 mg/dL (ref >39.00)
LDL Cholesterol: 86 mg/dL (ref 0–99)
NonHDL: 115.24
Total CHOL/HDL Ratio: 3
Triglycerides: 146 mg/dL (ref 0.0–149.0)
VLDL: 29.2 mg/dL (ref 0.0–40.0)

## 2020-10-21 LAB — COMPREHENSIVE METABOLIC PANEL
ALT: 57 U/L — ABNORMAL HIGH (ref 0–35)
AST: 37 U/L (ref 0–37)
Albumin: 4.3 g/dL (ref 3.5–5.2)
Alkaline Phosphatase: 87 U/L (ref 39–117)
BUN: 10 mg/dL (ref 6–23)
CO2: 26 mEq/L (ref 19–32)
Calcium: 9.4 mg/dL (ref 8.4–10.5)
Chloride: 103 mEq/L (ref 96–112)
Creatinine, Ser: 0.55 mg/dL (ref 0.40–1.20)
GFR: 109.98 mL/min (ref >60.00)
Glucose, Bld: 81 mg/dL (ref 70–99)
Potassium: 4.2 mEq/L (ref 3.5–5.1)
Sodium: 138 mEq/L (ref 135–145)
Total Bilirubin: 0.4 mg/dL (ref 0.2–1.2)
Total Protein: 7.8 g/dL (ref 6.0–8.3)

## 2020-10-21 LAB — CBC WITH DIFFERENTIAL/PLATELET
Basophils Absolute: 0 10*3/uL (ref 0.0–0.1)
Basophils Relative: 0.4 % (ref 0.0–3.0)
Eosinophils Absolute: 0.2 10*3/uL (ref 0.0–0.7)
Eosinophils Relative: 2.9 % (ref 0.0–5.0)
HCT: 39.4 % (ref 36.0–46.0)
Hemoglobin: 13.6 g/dL (ref 12.0–15.0)
Lymphocytes Relative: 30.1 % (ref 12.0–46.0)
Lymphs Abs: 2.1 10*3/uL (ref 0.7–4.0)
MCHC: 34.5 g/dL (ref 30.0–36.0)
MCV: 85.8 fl (ref 78.0–100.0)
Monocytes Absolute: 0.7 10*3/uL (ref 0.1–1.0)
Monocytes Relative: 10.5 % (ref 3.0–12.0)
Neutro Abs: 3.9 10*3/uL (ref 1.4–7.7)
Neutrophils Relative %: 56.1 % (ref 43.0–77.0)
Platelets: 190 10*3/uL (ref 150.0–400.0)
RBC: 4.59 Mil/uL (ref 3.87–5.11)
RDW: 13.6 % (ref 11.5–15.5)
WBC: 6.9 10*3/uL (ref 4.0–10.5)

## 2020-10-21 LAB — HEMOGLOBIN A1C: Hgb A1c MFr Bld: 5.7 % (ref 4.6–6.5)

## 2020-10-21 LAB — TSH: TSH: 4.49 u[IU]/mL (ref 0.35–5.50)

## 2020-10-21 MED ORDER — METFORMIN HCL ER 500 MG PO TB24: 500.0000 mg | ORAL_TABLET | Freq: Two times a day (BID) | ORAL | 3 refills | Status: DC

## 2020-10-21 MED ORDER — LEVOTHYROXINE SODIUM 50 MCG PO TABS: 50.0000 ug | ORAL_TABLET | Freq: Every day | ORAL | 3 refills | Status: DC

## 2020-10-21 NOTE — Progress Notes (Signed)
Jasmine Howard 45 y.o.   Chief Complaint  Patient presents with   New Patient (Initial Visit)    Pt would like to discuss metformin, she states she has swelling. Pt states she has trouble sleeping at night   Medication Refill    Metformin and levothyorxine    HISTORY OF PRESENT ILLNESS: This is a 45 y.o. female first visit to this office here to establish care with me. Has a history of hypothyroidism and diabetes.  Needs medication refill on metformin and levothyroxine. Today complaining of a generalized feeling of fluid retention. No other complaints or medical concerns today.  Medication Refill Pertinent negatives include no abdominal pain, chest pain, chills, congestion, coughing, fever, headaches, nausea, rash, sore throat or vomiting.    Prior to Admission medications   Medication Sig Start Date End Date Taking? Authorizing Provider  levonorgestrel (MIRENA) 20 MCG/24HR IUD 1 Intra Uterine Device (1 each total) by Intrauterine route once. 10/25/12  Yes Newt Lukes, MD  levothyroxine (SYNTHROID, LEVOTHROID) 50 MCG tablet Take 1 tablet (50 mcg total) by mouth daily before breakfast. 07/22/16  Yes Loletta Specter, PA-C  metFORMIN (GLUCOPHAGE-XR) 500 MG 24 hr tablet Take 1 tablet (500 mg total) by mouth 2 (two) times daily with a meal. 07/21/16  Yes Loletta Specter, PA-C  PARoxetine (PAXIL) 10 MG tablet Take 1 tablet (10 mg total) by mouth daily. Patient not taking: Reported on 10/21/2020 07/21/16   Loletta Specter, PA-C    No Known Allergies  Patient Active Problem List   Diagnosis Date Noted   Elevated blood pressure reading 11/21/2015   Headache 03/29/2014   Syncope 08/25/2010    Class: Acute   Hypothyroidism 12/10/2009   Type II diabetes mellitus, well controlled (HCC) 12/10/2009   Irritable bowel syndrome 03/31/2009   FATTY LIVER DISEASE 03/31/2009    Past Medical History:  Diagnosis Date   ANEMIA-NOS    BENIGN POSITIONAL VERTIGO    Cervical  incompetence    s/p cerclage 03/01/12   DEPRESSION    DIABETES MELLITUS, TYPE II    metformin   FATTY LIVER DISEASE    HYPOTHYROIDISM    Infertility, female    IRITIS    Irritable bowel syndrome    Polycystic ovaries    Postpartum care following cesarean delivery (breech 11/16) 12/12/2010    Past Surgical History:  Procedure Laterality Date   CERVICAL CERCLAGE N/A 03/03/2012   Procedure: CERCLAGE CERVICAL;  Surgeon: Tresa Endo A. Ernestina Penna, MD;  Location: [facility];  Service: Gynecology;  Laterality: N/A;  EDD:  09/07/12   CESAREAN SECTION  12/11/2010   Procedure: CESAREAN SECTION;  Surgeon: Tresa Endo A. Fogleman;  Location: [facility];  Service: Gynecology;  Laterality: N/A;   CESAREAN SECTION N/A 09/01/2012   Procedure: Repeat CESAREAN SECTION;  Surgeon: Tresa Endo A. Ernestina Penna, MD;  Location: [facility];  Service: Obstetrics;  Laterality: N/A;  EDD: 09/07/12   CHOLECYSTECTOMY  2009   SCAR REVISION  09/01/2012   Procedure: SCAR REVISION;  Surgeon: Tresa Endo A. Ernestina Penna, MD;  Location: [facility];  Service: Obstetrics;;  cesarean section scar revision    Social History   Socioeconomic History   Marital status: Married    Spouse name: Not on file   Number of children: Not on file   Years of education: Not on file   Highest education level: Not on file  Occupational History   Not on file  Tobacco Use   Smoking status: Never   Smokeless tobacco: Never  Tobacco comments:    Married, lives with spouse and 2 kids. 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  Negative for chills and fever.  HENT: Negative.  Negative for congestion and sore throat.   Respiratory: Negative.  Negative for cough and shortness of breath.   Cardiovascular:  Positive for leg swelling. Negative for chest pain and palpitations.  Gastrointestinal: Negative.  Negative for abdominal pain, diarrhea, nausea and vomiting.  Genitourinary: Negative.  Negative for dysuria.  Skin: Negative.  Negative for rash.  Neurological: Negative.  Negative for dizziness and headaches.  All other systems reviewed and are negative.   Physical Exam Vitals reviewed.  Constitutional:      Appearance: Normal appearance. She is obese.  HENT:     Mouth/Throat:     Mouth: Mucous membranes are moist.     Pharynx: Oropharynx is clear.  Eyes:     Extraocular Movements: Extraocular movements intact.     Conjunctiva/sclera: Conjunctivae normal.     Pupils: Pupils are equal, round, and reactive to light.  Cardiovascular:     Rate and Rhythm: Normal rate and regular rhythm.     Pulses: Normal pulses.     Heart sounds: Normal heart sounds.  Pulmonary:     Effort: Pulmonary effort is normal.     Breath sounds: Normal breath sounds.  Musculoskeletal:        General: Normal range of motion.     Cervical back: Normal range of motion. No tenderness.     Right lower leg: No edema.     Left lower leg: No edema.  Lymphadenopathy:     Cervical: No cervical adenopathy.  Skin:    General: Skin is warm and dry.     Capillary Refill: Capillary refill takes less than 2 seconds.  Neurological:     General: No focal deficit present.     Mental Status: She is alert and oriented to person, place, and time.  Psychiatric:        Mood and Affect: Mood normal.        Behavior: Behavior normal.     ASSESSMENT & PLAN: Hypothyroidism Clinically  euthyroid.  Continue Synthroid 50 mcg daily.  TSH drawn today.  Type 2 diabetes mellitus with hyperglycemia, without long-term current use of insulin (HCC) Diet and nutrition discussed.  Continue metformin 500 mg twice a day.  A1c drawn today.  Jasmine Howard was seen today for new patient (initial visit) and medication refill.  Diagnoses and all orders for this visit:  Type 2 diabetes mellitus with hyperglycemia, without long-term current use of insulin (HCC) -     metFORMIN (GLUCOPHAGE-XR) 500 MG 24 hr tablet; Take 1 tablet (500 mg total) by mouth 2 (two)  times daily with a meal. -     Comprehensive metabolic panel -     Hemoglobin A1c -     Lipid panel -     CBC with Differential/Platelet -     Microalbumin, urine  Hypothyroidism, unspecified type -     levothyroxine (SYNTHROID) 50 MCG tablet; Take 1 tablet (50 mcg total) by mouth daily before breakfast. -     TSH -     CBC with Differential/Platelet  Encounter to establish care   Patient Instructions  Diabetes mellitus y nutricin, en adultos Diabetes Mellitus and Nutrition, Adult Si sufre de diabetes, o diabetes mellitus, es muy importante tener hbitos alimenticios saludables debido a que sus niveles de Psychologist, counselling sangre (glucosa) se ven afectados en gran medida por lo que come y bebe. [place] alimentos saludables en las cantidades correctas, aproximadamente a la misma hora todos los [place], [place] ayudar a: Scientist, physiological glucemia. Disminuir el riesgo de sufrir una enfermedad cardaca. Mejorar la presin arterial. Barista o mantener un peso saludable. Qu puede afectar mi plan de alimentacin? Todas las personas que sufren de diabetes son diferentes y cada una tiene necesidades diferentes en cuanto a un plan de alimentacin. El mdico puede recomendarle que trabaje con un nutricionista para elaborar el mejor plan para usted. Su plan de alimentacin puede variar segn factores como: Las caloras que necesita. Los medicamentos que  toma. Su peso. Sus niveles de glucemia, presin arterial y colesterol. Su nivel de [place]. Otras afecciones que tenga, como enfermedades cardacas o renales. Cmo me afectan los carbohidratos? Los carbohidratos, o hidratos de carbono, afectan su nivel de glucemia ms que cualquier otro tipo de alimento. La ingesta de carbohidratos naturalmente aumenta la cantidad de [employer]. El recuento de carbohidratos es un mtodo destinado a Midwife un registro de la cantidad de carbohidratos que se consumen. El recuento de carbohidratos es importante para Pharmacologist la glucemia a un nivel saludable, especialmente si utiliza insulina o toma determinados medicamentos por va oral para la diabetes. Es importante conocer la cantidad de carbohidratos que se pueden ingerir en cada comida sin correr Surveyor, minerals. Esto es Government social research officer. Su nutricionista puede ayudarlo a calcular la cantidad de carbohidratos que debe ingerir en cada comida y en cada refrigerio. Cmo me afecta el alcohol? El alcohol puede provocar disminuciones sbitas de la glucemia (hipoglucemia), especialmente si utiliza insulina o toma determinados medicamentos por va oral para la diabetes. La hipoglucemia es una afeccin potencialmente mortal. Los sntomas de la hipoglucemia, como somnolencia, mareos y confusin, son similares a los sntomas de haber consumido demasiado alcohol. No beba alcohol si: Su mdico le indica no hacerlo. Est embarazada, puede estar embarazada o est tratando de [place]. Si bebe alcohol: No beba con el estmago vaco. Limite la cantidad que bebe: De 0 a 1 medida por da para las mujeres. De 0 a 2 medidas por da para los hombres. Est atento a la cantidad de alcohol que hay en las bebidas que toma. En los [address], una medida equivale a una botella de cerveza de 12 oz (355 ml), un vaso de vino de 5 oz (148 ml) o un vaso de una bebida alcohlica de alta graduacin de 1 oz (44  ml). Mantngase hidratado bebiendo agua, refrescos dietticos o t helado sin azcar. Tenga en cuenta que los refrescos comunes, los jugos y otras bebida para Engineer, manufacturing pueden contener mucha azcar y se deben contar como carbohidratos. Consejos para seguir Consulting civil engineer  las etiquetas de los alimentos Comience por leer el tamao de la porcin en la "Informacin nutricional" en las etiquetas de los alimentos envasados y las bebidas. La cantidad de caloras, carbohidratos, grasas y otros nutrientes mencionados en la etiqueta se basan en una porcin del alimento. Muchos alimentos contienen ms de una porcin por envase. Verifique la cantidad total de gramos (g) de carbohidratos totales en una porcin. Puede calcular la cantidad de porciones de carbohidratos al dividir el total de carbohidratos por 15. Por ejemplo, si un alimento tiene un total de 30 g de carbohidratos totales por porcin, equivale a 2 porciones de carbohidratos. Verifique la cantidad de gramos (g) de grasas saturadas y grasas trans de una porcin. Escoja alimentos que no contengan estas grasas o que su contenido de estas sea [place]. Verifique la cantidad de miligramos (mg) de sal (sodio) en una porcin. La [employer] de las personas deben limitar la ingesta de sodio total a menos de 2300 mg Google. Siempre consulte la informacin nutricional de los alimentos etiquetados como "con bajo contenido de grasa" o "sin grasa". Estos alimentos pueden tener un mayor contenido de International aid/development worker agregada o carbohidratos refinados, y deben evitarse. Hable con su nutricionista para identificar sus objetivos diarios en cuanto a los nutrientes mencionados en la etiqueta. Al ir de compras Evite comprar alimentos procesados, enlatados o precocidos. Estos alimentos tienden a Counselling psychologist mayor cantidad de [place], sodio y azcar agregada. Compre en la zona exterior de la tienda de comestibles. Esta es la zona donde se encuentran con mayor frecuencia las frutas y las verduras  frescas, los cereales a granel, las carnes frescas y los productos lcteos frescos. Al cocinar Utilice mtodos de coccin a baja temperatura, como hornear, en lugar de mtodos de coccin a alta temperatura, como frer en abundante aceite. Cocine con aceites saludables, como el aceite de [place], canola o [place]. Evite cocinar con manteca, crema o carnes con alto contenido de grasa. Planificacin de las comidas Coma las comidas y los refrigerios regularmente, preferentemente a la misma hora todos [place]. Evite pasar largos perodos de tiempo sin comer. Consuma alimentos ricos en fibra, como frutas frescas, verduras, frijoles y cereales integrales. Consulte a su nutricionista sobre cuntas porciones de carbohidratos puede consumir en cada comida. Consuma entre 4 y 6 onzas (entre 112 y 168 g) de protenas magras por da, como carnes [place], pollo, pescado, huevos o tofu. Una onza (oz) de protena magra equivale a: 1 onza (28 g) de carne, pollo o pescado. 1 huevo.  de taza (62 g) de tofu. Coma algunos alimentos por da que contengan grasas saludables, como aguacates, frutos secos, semillas y pescado. Qu alimentos debo comer? Nils Pyle Bayas. Manzanas. Naranjas. Duraznos. Damascos. Ciruelas. Uvas. Mango. Papaya. Gran[place]. Kiwi. Cerezas. Hoover Brunette Deatra James. Espinaca. Verduras de [employer], que incluyen col rizada, [place], hojas de [place] y de [place]. Remolachas. Coliflor. Repollo. Brcoli. Zanahorias. Judas verdes. Tomates. Pimientos. Cebollas. Pepinos. Coles de Bruselas. Granos Granos integrales, como panes, galletas, tortillas, cereales y pastas de salvado o integrales. Avena sin azcar. Quinua. Arroz integral o salvaje. Carnes y Warehouse manager. Carne de ave sin piel. Cortes magros de ave y carne de res. Tofu. Frutos secos. Semillas. Lcteos Productos lcteos sin grasa o con bajo contenido de [place], [place], yogur y [place]. Es posible que los productos que se enumeran ms [place] no  constituyan una lista completa de los alimentos y las bebidas que puede tomar. Consulte a un nutricionista para obtener ms informacin. Qu alimentos debo evitar? Nils Pyle  Frutas enlatadas al almbar. Verduras Verduras enlatadas. Verduras congeladas con mantequilla o salsa de crema. Granos Productos elaborados con [place] y [place], como panes, pastas, bocadillos y cereales. Evite todos los alimentos procesados. Carnes y [address] de carne con alto contenido de Holiday representative. Carne de ave con piel. Carnes empanizadas o fritas. Carne procesada. Evite las grasas saturadas. Lcteos Yogur, queso o Cardinal Health. Bebidas Bebidas azucaradas, como gaseosas o t helado. Es posible que los productos que se enumeran ms [place] no constituyan una lista completa de los alimentos y las bebidas que Personnel officer. Consulte a un nutricionista para obtener ms informacin. Preguntas para hacerle al mdico Es necesario que me rena con IT trainer en el cuidado de la diabetes? Es necesario que me rena con un nutricionista? A qu nmero puedo llamar si tengo preguntas? Cules son los mejores momentos para controlar la glucemia? Dnde encontrar ms informacin: Asociacin Estadounidense de la Diabetes (American Diabetes Association): diabetes.org Academy of Nutrition and Dietetics (Academia de Nutricin y Pension scheme manager): www.eatright.Dana Corporation of Diabetes and Digestive and Kidney Diseases [employer] de la Diabetes y las Enfermedades Digestivas y Renales): CarFlippers.tn Association of Diabetes Care and Education Specialists (Asociacin de Especialistas en Atencin y Educacin sobre la Diabetes): www.diabeteseducator.org Resumen Es importante tener hbitos alimenticios saludables debido a que sus niveles de Psychologist, counselling sangre (glucosa) se ven afectados en gran medida por lo que come y bebe. Un plan de alimentacin saludable lo ayudar a controlar la glucemia y  Pharmacologist un estilo de vida saludable. El mdico puede recomendarle que trabaje con un nutricionista para elaborar el mejor plan para usted. Tenga en cuenta que los carbohidratos (hidratos de carbono) y el alcohol tienen efectos inmediatos en sus niveles de glucemia. Es importante contar los carbohidratos que ingiere y consumir alcohol con prudencia. Esta informacin no tiene Theme park manager el consejo del mdico. Asegrese de hacerle al mdico cualquier pregunta que tenga. Document Revised: 02/15/2019 Document Reviewed: 02/15/2019 Elsevier Patient Education  2021 Elsevier Inc.   Edwina Barth, MD [facility]

## 2020-10-21 NOTE — Assessment & Plan Note (Signed)
Diet and nutrition discussed.  Continue metformin 500 mg twice a day.  A1c drawn today.

## 2020-10-21 NOTE — Assessment & Plan Note (Signed)
Clinically euthyroid.  Continue Synthroid 50 mcg daily.  TSH drawn today.

## 2020-10-21 NOTE — Patient Instructions (Signed)
Diabetes mellitus y nutricin, en adultos Diabetes Mellitus and Nutrition, Adult Si sufre de diabetes, o diabetes mellitus, es muy importante tener hbitos alimenticios saludables debido a que sus niveles de azcar en la sangre (glucosa) se ven afectados en gran medida por lo que come y bebe. Comer alimentos saludables en las cantidades correctas, aproximadamente a la misma hora todos los das, lo ayudar a: Controlar la glucemia. Disminuir el riesgo de sufrir una enfermedad cardaca. Mejorar la presin arterial. Alcanzar o mantener un peso saludable. Qu puede afectar mi plan de alimentacin? Todas las personas que sufren de diabetes son diferentes y cada una tiene necesidades diferentes en cuanto a un plan de alimentacin. El mdico puede recomendarle que trabaje con un nutricionista para elaborar el mejor plan para usted. Su plan de alimentacin puede variar segn factores como: Las caloras que necesita. Los medicamentos que toma. Su peso. Sus niveles de glucemia, presin arterial y colesterol. Su nivel de actividad. Otras afecciones que tenga, como enfermedades cardacas o renales. Cmo me afectan los carbohidratos? Los carbohidratos, o hidratos de carbono, afectan su nivel de glucemia ms que cualquier otro tipo de alimento. La ingesta de carbohidratos naturalmente aumenta la cantidad de glucosa en la sangre. El recuento de carbohidratos es un mtodo destinado a llevar un registro de la cantidad de carbohidratos que se consumen. El recuento de carbohidratos es importante para mantener la glucemia a un nivel saludable, especialmente si utiliza insulina o toma determinados medicamentos por va oral para la diabetes. Es importante conocer la cantidad de carbohidratos que se pueden ingerir en cada comida sin correr ningn riesgo. Esto es diferente en cada persona. Su nutricionista puede ayudarlo a calcular la cantidad de carbohidratos que debe ingerir en cada comida y en cada refrigerio. Cmo  me afecta el alcohol? El alcohol puede provocar disminuciones sbitas de la glucemia (hipoglucemia), especialmente si utiliza insulina o toma determinados medicamentos por va oral para la diabetes. La hipoglucemia es una afeccin potencialmente mortal. Los sntomas de la hipoglucemia, como somnolencia, mareos y confusin, son similares a los sntomas de haber consumido demasiado alcohol. No beba alcohol si: Su mdico le indica no hacerlo. Est embarazada, puede estar embarazada o est tratando de quedar embarazada. Si bebe alcohol: No beba con el estmago vaco. Limite la cantidad que bebe: De 0 a 1 medida por da para las mujeres. De 0 a 2 medidas por da para los hombres. Est atento a la cantidad de alcohol que hay en las bebidas que toma. En [place], una medida equivale a una botella de cerveza de 12 oz (355 ml), un vaso de vino de 5 oz (148 ml) o un vaso de una bebida alcohlica de alta graduacin de 1 oz (44 ml). Mantngase hidratado bebiendo agua, refrescos dietticos o t helado sin azcar. Tenga en cuenta que los refrescos comunes, los jugos y otras bebida para mezclar pueden contener mucha azcar y se deben contar como carbohidratos. Consejos para seguir este plan Leer las etiquetas de los alimentos Comience por leer el tamao de la porcin en la "Informacin nutricional" en las etiquetas de los alimentos envasados y las bebidas. La cantidad de caloras, carbohidratos, grasas y otros nutrientes mencionados en la etiqueta se basan en una porcin del alimento. Muchos alimentos contienen ms de una porcin por envase. Verifique la cantidad total de gramos (g) de carbohidratos totales en una porcin. Puede calcular la cantidad de porciones de carbohidratos al dividir el total de carbohidratos por 15. Por ejemplo, si un alimento tiene un   total de 30 g de carbohidratos totales por porcin, equivale a 2 porciones de carbohidratos. Verifique la cantidad de gramos (g) de grasas  saturadas y grasas trans de una porcin. Escoja alimentos que no contengan estas grasas o que su contenido de estas sea bajo. Verifique la cantidad de miligramos (mg) de sal (sodio) en una porcin. La mayora de las personas deben limitar la ingesta de sodio total a menos de 2300 mg por da. Siempre consulte la informacin nutricional de los alimentos etiquetados como "con bajo contenido de grasa" o "sin grasa". Estos alimentos pueden tener un mayor contenido de azcar agregada o carbohidratos refinados, y deben evitarse. Hable con su nutricionista para identificar sus objetivos diarios en cuanto a los nutrientes mencionados en la etiqueta. Al ir de compras Evite comprar alimentos procesados, enlatados o precocidos. Estos alimentos tienden a tener una mayor cantidad de grasa, sodio y azcar agregada. Compre en la zona exterior de la tienda de comestibles. Esta es la zona donde se encuentran con mayor frecuencia las frutas y las verduras frescas, los cereales a granel, las carnes frescas y los productos lcteos frescos. Al cocinar Utilice mtodos de coccin a baja temperatura, como hornear, en lugar de mtodos de coccin a alta temperatura, como frer en abundante aceite. Cocine con aceites saludables, como el aceite de oliva, canola o girasol. Evite cocinar con manteca, crema o carnes con alto contenido de grasa. Planificacin de las comidas Coma las comidas y los refrigerios regularmente, preferentemente a la misma hora todos los das. Evite pasar largos perodos de tiempo sin comer. Consuma alimentos ricos en fibra, como frutas frescas, verduras, frijoles y cereales integrales. Consulte a su nutricionista sobre cuntas porciones de carbohidratos puede consumir en cada comida. Consuma entre 4 y 6 onzas (entre 112 y 168 g) de protenas magras por da, como carnes magras, pollo, pescado, huevos o tofu. Una onza (oz) de protena magra equivale a: 1 onza (28 g) de carne, pollo o pescado. 1 huevo.  de  taza (62 g) de tofu. Coma algunos alimentos por da que contengan grasas saludables, como aguacates, frutos secos, semillas y pescado. Qu alimentos debo comer? Frutas Bayas. Manzanas. Naranjas. Duraznos. Damascos. Ciruelas. Uvas. Mango. Papaya. Granada. Kiwi. Cerezas. Verduras Lechuga. Espinaca. Verduras de hoja verde, que incluyen col rizada, acelga, hojas de berza y de mostaza. Remolachas. Coliflor. Repollo. Brcoli. Zanahorias. Judas verdes. Tomates. Pimientos. Cebollas. Pepinos. Coles de Bruselas. Granos Granos integrales, como panes, galletas, tortillas, cereales y pastas de salvado o integrales. Avena sin azcar. Quinua. Arroz integral o salvaje. Carnes y otras protenas Mariscos. Carne de ave sin piel. Cortes magros de ave y carne de res. Tofu. Frutos secos. Semillas. Lcteos Productos lcteos sin grasa o con bajo contenido de grasa, como leche, yogur y queso. Es posible que los productos que se enumeran ms arriba no constituyan una lista completa de los alimentos y las bebidas que puede tomar. Consulte a un nutricionista para obtener ms informacin. Qu alimentos debo evitar? Frutas Frutas enlatadas al almbar. Verduras Verduras enlatadas. Verduras congeladas con mantequilla o salsa de crema. Granos Productos elaborados con harina y harina blanca refinada, como panes, pastas, bocadillos y cereales. Evite todos los alimentos procesados. Carnes y otras protenas Cortes de carne con alto contenido de grasa. Carne de ave con piel. Carnes empanizadas o fritas. Carne procesada. Evite las grasas saturadas. Lcteos Yogur, queso o leche enteros. Bebidas Bebidas azucaradas, como gaseosas o t helado. Es posible que los productos que se enumeran ms arriba no constituyan una lista completa de   los alimentos y las bebidas que debe evitar. Consulte a un nutricionista para obtener ms informacin. Preguntas para hacerle al mdico Es necesario que me rena con un instructor en el cuidado  de la diabetes? Es necesario que me rena con un nutricionista? A qu nmero puedo llamar si tengo preguntas? Cules son los mejores momentos para controlar la glucemia? Dnde encontrar ms informacin: Asociacin Estadounidense de la Diabetes (American Diabetes Association): diabetes.org Academy of Nutrition and Dietetics (Academia de Nutricin y Diettica): www.eatright.org National Institute of Diabetes and Digestive and Kidney Diseases (Instituto Nacional de la Diabetes y las Enfermedades Digestivas y Renales): www.niddk.nih.gov Association of Diabetes Care and Education Specialists (Asociacin de Especialistas en Atencin y Educacin sobre la Diabetes): www.diabeteseducator.org Resumen Es importante tener hbitos alimenticios saludables debido a que sus niveles de azcar en la sangre (glucosa) se ven afectados en gran medida por lo que come y bebe. Un plan de alimentacin saludable lo ayudar a controlar la glucemia y mantener un estilo de vida saludable. El mdico puede recomendarle que trabaje con un nutricionista para elaborar el mejor plan para usted. Tenga en cuenta que los carbohidratos (hidratos de carbono) y el alcohol tienen efectos inmediatos en sus niveles de glucemia. Es importante contar los carbohidratos que ingiere y consumir alcohol con prudencia. Esta informacin no tiene como fin reemplazar el consejo del mdico. Asegrese de hacerle al mdico cualquier pregunta que tenga. Document Revised: 02/15/2019 Document Reviewed: 02/15/2019 Elsevier Patient Education  2021 [employer].  

## 2020-10-22 LAB — MICROALBUMIN, URINE: Microalb, Ur: 0.7 mg/dL

## 2021-01-22 ENCOUNTER — Other Ambulatory Visit: Payer: Self-pay

## 2021-01-22 ENCOUNTER — Other Ambulatory Visit: Payer: Self-pay | Admitting: Emergency Medicine

## 2021-01-22 ENCOUNTER — Ambulatory Visit (INDEPENDENT_AMBULATORY_CARE_PROVIDER_SITE_OTHER): Payer: 59 | Admitting: Emergency Medicine

## 2021-01-22 ENCOUNTER — Encounter: Payer: Self-pay | Admitting: Emergency Medicine

## 2021-01-22 VITALS — BP 134/82 | HR 66 | Ht 64.0 in | Wt 226.0 lb

## 2021-01-22 DIAGNOSIS — R609 Edema, unspecified: Secondary | ICD-10-CM | POA: Insufficient documentation

## 2021-01-22 DIAGNOSIS — Z23 Encounter for immunization: Secondary | ICD-10-CM | POA: Diagnosis not present

## 2021-01-22 DIAGNOSIS — I152 Hypertension secondary to endocrine disorders: Secondary | ICD-10-CM | POA: Insufficient documentation

## 2021-01-22 DIAGNOSIS — E039 Hypothyroidism, unspecified: Secondary | ICD-10-CM | POA: Diagnosis not present

## 2021-01-22 DIAGNOSIS — I10 Essential (primary) hypertension: Secondary | ICD-10-CM

## 2021-01-22 DIAGNOSIS — E1165 Type 2 diabetes mellitus with hyperglycemia: Secondary | ICD-10-CM | POA: Diagnosis not present

## 2021-01-22 MED ORDER — OZEMPIC (0.25 OR 0.5 MG/DOSE) 2 MG/1.5ML ~~LOC~~ SOPN: 0.5000 mg | PEN_INJECTOR | SUBCUTANEOUS | 10 refills | Status: DC

## 2021-01-22 MED ORDER — HYDROCHLOROTHIAZIDE 12.5 MG PO TABS: 12.5000 mg | ORAL_TABLET | Freq: Every day | ORAL | 3 refills | Status: DC

## 2021-01-22 NOTE — Assessment & Plan Note (Signed)
Elevated blood pressure readings at home and several times in the office. Complaining of occasional peripheral edema. May benefit from starting hydrochlorothiazide 12.5 mg daily. Advised to record blood pressure readings daily at home for the next several weeks and keep a log. Follow-up in 3 months.

## 2021-01-22 NOTE — Assessment & Plan Note (Signed)
Clinically euthyroid.  We will repeat TSH next visit.  Continue Synthroid 50 mcg daily.

## 2021-01-22 NOTE — Progress Notes (Signed)
Jasmine Howard 45 y.o.   Chief Complaint  Patient presents with   Follow-up    6 month f /u Thyroid and prediabetes. Pt states she has been having headaches, pt feels its do to elevated BP. Pt has body swelling    HISTORY OF PRESENT ILLNESS: This is a 45 y.o. female with history of hypothyroidism and diabetes here for follow-up. Complaining of chronic headaches and occasional insomnia Also complaining of generalized swelling.  Thinks it may be due to her elevated blood pressure. Has never been on blood pressure medication but has had elevated blood pressure readings during several visits. Gaining weight.  Perimenopausal. Lab Results  Component Value Date   HGBA1C 5.7 10/21/2020   BP Readings from Last 3 Encounters:  01/22/21 134/82  10/21/20 130/80  09/18/16 121/86     HPI   Prior to Admission medications   Medication Sig Start Date End Date Taking? Authorizing Provider  levonorgestrel (MIRENA) 20 MCG/24HR IUD 1 Intra Uterine Device (1 each total) by Intrauterine route once. 10/25/12  Yes Newt Lukes, MD  levothyroxine (SYNTHROID) 50 MCG tablet Take 1 tablet (50 mcg total) by mouth daily before breakfast. 10/21/20  Yes Chantella Creech, Eilleen Kempf, MD  metFORMIN (GLUCOPHAGE-XR) 500 MG 24 hr tablet Take 1 tablet (500 mg total) by mouth 2 (two) times daily with a meal. 10/21/20  Yes Georgina Quint, MD    No Known Allergies  Patient Active Problem List   Diagnosis Date Noted   Elevated blood pressure reading 11/21/2015   Headache 03/29/2014   Hypothyroidism 12/10/2009   Type 2 diabetes mellitus with hyperglycemia, without long-term current use of insulin (HCC) 12/10/2009   Irritable bowel syndrome 03/31/2009   FATTY LIVER DISEASE 03/31/2009    Past Medical History:  Diagnosis Date   ANEMIA-NOS    BENIGN POSITIONAL VERTIGO    Cervical incompetence    s/p cerclage 03/01/12   DEPRESSION    DIABETES MELLITUS, TYPE II    metformin   FATTY LIVER DISEASE     HYPOTHYROIDISM    Infertility, female    IRITIS    Irritable bowel syndrome    Polycystic ovaries    Postpartum care following cesarean delivery (breech 11/16) 12/12/2010    Past Surgical History:  Procedure Laterality Date   CERVICAL CERCLAGE N/A 03/03/2012   Procedure: CERCLAGE CERVICAL;  Surgeon: Tresa Endo A. Ernestina Penna, MD;  Location: [facility];  Service: Gynecology;  Laterality: N/A;  EDD:  09/07/12   CESAREAN SECTION  12/11/2010   Procedure: CESAREAN SECTION;  Surgeon: Tresa Endo A. Fogleman;  Location: [facility];  Service: Gynecology;  Laterality: N/A;   CESAREAN SECTION N/A 09/01/2012   Procedure: Repeat CESAREAN SECTION;  Surgeon: Tresa Endo A. Ernestina Penna, MD;  Location: [facility];  Service: Obstetrics;  Laterality: N/A;  EDD: 09/07/12   CHOLECYSTECTOMY  2009   SCAR REVISION  09/01/2012   Procedure: SCAR REVISION;  Surgeon: Tresa Endo A. Ernestina Penna, MD;  Location: [facility];  Service: Obstetrics;;  cesarean section scar revision    Social History   Socioeconomic History   Marital status: Married    Spouse name: Not on file   Number of children: Not on file   Years of education: Not on file   Highest education level: Not on file  Occupational History   Not on file  Tobacco Use   Smoking status: Never   Smokeless tobacco: Never   Tobacco comments:    Married, lives with spouse and 2 kids. 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  Negative for chills and fever.  HENT: Negative.  Negative for congestion and sore throat.   Respiratory: Negative.  Negative for cough and shortness of breath.   Cardiovascular:  Positive for leg swelling. Negative for chest pain and palpitations.  Gastrointestinal: Negative.  Negative for abdominal pain, diarrhea, nausea and vomiting.  Genitourinary: Negative.   Musculoskeletal: Negative.   Skin: Negative.  Negative for rash.  Neurological:  Negative for dizziness.  All other systems reviewed and are negative.  Today's Vitals   01/22/21 1416  BP: 134/82  Pulse: 66  SpO2: 96%  Weight: 226 lb (102.5 kg)  Height: 5\' 4"  (1.626 m)   Body mass index is 38.79 kg/m. Wt Readings from Last 3 Encounters:  01/22/21 226 lb (102.5 kg)  10/21/20 218 lb (98.9 kg)  08/17/16 208 lb 9.6 oz (94.6 kg)    Physical Exam Vitals reviewed.  Constitutional:      Appearance: Normal appearance. She is obese.  HENT:     Head: Normocephalic.  Eyes:     Extraocular Movements: Extraocular movements intact.     Conjunctiva/sclera: Conjunctivae normal.     Pupils: Pupils are equal, round, and reactive to light.  Cardiovascular:     Rate and Rhythm: Normal rate and regular rhythm.     Pulses: Normal pulses.     Heart sounds: Normal heart sounds.  Pulmonary:     Effort: Pulmonary effort is normal.     Breath sounds: Normal breath sounds.  Abdominal:     General: There is no distension.     Palpations: Abdomen is soft.     Tenderness: There is no abdominal tenderness.  Musculoskeletal:        General: Normal range of motion.     Cervical back: Normal range of motion and neck supple.     Right lower leg: No edema.     Left lower leg: No edema.  Skin:    General: Skin is warm and dry.     Capillary Refill: Capillary refill takes less than 2 seconds.  Neurological:     General: No focal deficit present.     Mental Status: She is alert and oriented to person,  place, and time.  Psychiatric:        Mood and Affect: Mood normal.        Behavior: Behavior normal.     ASSESSMENT & PLAN: A total of 50 minutes was spent with the patient and counseling/coordination of care regarding preparing for this visit, review of most recent office visit notes, review of most recent blood work results, review of all medications and changes.  Including addition of hydrochlorothiazide and Ozempic, cardiovascular risk associated with hypertension and diabetes,  education on nutrition, need to monitor blood pressure readings at home, prognosis, documentation and need for follow-up.  Problem List Items Addressed This Visit       Cardiovascular and Mediastinum   Essential hypertension - Primary    Elevated blood pressure readings at home and several times in the office. Complaining of occasional peripheral edema. May benefit from starting hydrochlorothiazide 12.5 mg daily. Advised to record blood pressure readings daily at home for the next several weeks and keep a log. Follow-up in 3 months.      Relevant Medications   hydrochlorothiazide (HYDRODIURIL) 12.5 MG tablet     Endocrine   Hypothyroidism    Clinically euthyroid.  We will repeat TSH next visit.  Continue Synthroid 50 mcg daily.       Type 2 diabetes mellitus with hyperglycemia, without long-term current use of insulin (HCC)    Gaining weight.  Continue metformin. May benefit from Ozempic 0.5 mg weekly. Diet and nutrition discussed Follow-up in 3 months.      Relevant Medications   Semaglutide,0.25 or 0.5MG /DOS, (OZEMPIC, 0.25 OR 0.5 MG/DOSE,) 2 MG/1.5ML SOPN     Other   Edema   Relevant Medications   hydrochlorothiazide (HYDRODIURIL) 12.5 MG tablet   Other Visit Diagnoses     Need for influenza vaccination       Relevant Orders   Flu Vaccine QUAD 41mo+IM (Fluarix, Fluzone & Alfiuria Quad PF)      Patient Instructions  Diabetes mellitus y nutricin, en adultos Diabetes Mellitus and  Nutrition, Adult Si sufre de diabetes, o diabetes mellitus, es muy importante tener hbitos alimenticios saludables debido a que sus niveles de Psychologist, counselling sangre (glucosa) se ven afectados en gran medida por lo que come y bebe. [place] alimentos saludables en las cantidades correctas, aproximadamente a la misma hora todos los [place], [place] ayudar a: Chief Operating Officer su glucemia. Disminuir el riesgo de sufrir una enfermedad cardaca. Mejorar la presin arterial. Barista o mantener un peso saludable. Qu puede afectar mi plan de alimentacin? Todas las personas que sufren de diabetes son diferentes y cada una tiene necesidades diferentes en cuanto a un plan de alimentacin. El mdico puede recomendarle que trabaje con un nutricionista para elaborar el mejor plan para usted. Su plan de alimentacin puede variar segn factores como: Las caloras que necesita. Los medicamentos que toma. Su peso. Sus niveles de glucemia, presin arterial y colesterol. Su nivel de [place]. Otras afecciones que tenga, como enfermedades cardacas o renales. Cmo me afectan los carbohidratos? Los carbohidratos, o hidratos de carbono, afectan su nivel de glucemia ms que cualquier otro tipo de alimento. La ingesta de carbohidratos aumenta la cantidad de [employer]. Es importante conocer la cantidad de carbohidratos que se pueden ingerir en cada comida sin correr Surveyor, minerals. Esto es Government social research officer. Su nutricionista puede ayudarlo a calcular la cantidad de carbohidratos que debe ingerir en cada comida y en cada refrigerio. Cmo me afecta el alcohol? El alcohol puede provocar una disminucin de la glucemia (hipoglucemia), especialmente si [place] insulina o toma determinados medicamentos por va oral para la diabetes. La hipoglucemia es una afeccin potencialmente mortal. Los sntomas de la hipoglucemia, como somnolencia, mareos y confusin, son similares a los sntomas de haber consumido demasiado alcohol. No  beba alcohol si: Su mdico le indica no hacerlo. Est embarazada, puede estar embarazada o est tratando de [place]. Si bebe alcohol: Limite la cantidad que bebe a lo siguiente: De 0 a 1 medida por da para  las mujeres. De 0 a 2 medidas por da para los hombres. Sepa cunta cantidad de alcohol hay en las bebidas que toma. En los [address], una medida equivale a una botella de cerveza de 12 oz (355 ml), un vaso de vino de 5 oz (148 ml) o un vaso de una bebida alcohlica de alta graduacin de 1 oz (44 ml). Mantngase hidratado bebiendo agua, refrescos dietticos o t helado sin azcar. Tenga en cuenta que los refrescos comunes, los jugos y otras bebidas para mezclar pueden contener Product/process development scientist y se deben contar como carbohidratos. Consejos para seguir Consulting civil engineer las etiquetas de los alimentos Comience por leer el tamao de la porcin en la etiqueta de Informacin nutricional de los alimentos envasados y las bebidas. La cantidad de caloras, carbohidratos, grasas y otros nutrientes detallados en la etiqueta se basan en una porcin del alimento. Muchos alimentos contienen ms de una porcin por envase. Verifique la cantidad total de gramos (g) de carbohidratos totales en una porcin. Verifique la cantidad de gramos de grasas saturadas y grasas trans en una porcin. Escoja alimentos que no contengan estas grasas o que su contenido de estas sea [place]. Verifique la cantidad de miligramos (mg) de sal (sodio) en una porcin. La [employer] de las personas deben limitar la ingesta de sodio total a menos de 2300 mg Google. Siempre consulte la informacin nutricional de los alimentos etiquetados como con bajo contenido de [place] o sin grasa. Estos alimentos pueden tener un mayor contenido de International aid/development worker agregada o carbohidratos refinados, y deben evitarse. Hable con su nutricionista para identificar sus objetivos diarios en cuanto a los nutrientes mencionados en la etiqueta. Al ir de  compras Evite comprar alimentos procesados, enlatados o precocidos. Estos alimentos tienden a Counselling psychologist mayor cantidad de [place], sodio y azcar agregada. Compre en la zona exterior de la tienda de comestibles. Esta es la zona donde se encuentran con mayor frecuencia las frutas y las verduras frescas, los cereales a granel, las carnes frescas y los productos lcteos frescos. Al cocinar Use mtodos de coccin a baja temperatura, como hornear, en lugar de mtodos de coccin a alta temperatura, como frer en abundante aceite. Cocine con aceites saludables, como el aceite de [place], canola o [place]. Evite cocinar con manteca, crema o carnes con alto contenido de grasa. Planificacin de las comidas Coma las comidas y los refrigerios regularmente, preferentemente a la misma hora todos [place]. Evite pasar largos perodos de tiempo sin comer. Consuma alimentos ricos en fibra, como frutas frescas, verduras, frijoles y cereales integrales. Consuma entre 4 y 6 onzas (entre 112 y 168 g) de protenas magras por da, como carnes [place], pollo, pescado, huevos o tofu. Una onza (oz) (28 g) de protena magra equivale a: 1 onza (28 g) de carne, pollo o pescado. 1 huevo.  taza (62 g) de tofu. Coma algunos alimentos por da que contengan grasas saludables, como aguacates, frutos secos, semillas y pescado. Qu alimentos debo comer? Nils Pyle Bayas. Manzanas. Naranjas. Duraznos. Damascos. Ciruelas. Uvas. Mangos. Papayas. Granadas. Kiwi. Cerezas. Verduras Verduras de [employer], que incluyen [place], [place], col rizada, acelga, hojas de berza, hojas de mostaza y repollo. Remolachas. Coliflor. Brcoli. Zanahorias. Judas verdes. Tomates. Pimientos. Cebollas. Pepinos. Coles de Bruselas. Granos Granos integrales, como panes, galletas, tortillas, cereales y pastas de salvado o integrales. Avena sin azcar. Quinua. Arroz integral o salvaje. Carnes y otras protenas Frutos de mar. Carne de ave sin piel. Cortes magros de  ave y carne de res. Tofu. Frutos  secos. Semillas. Lcteos Productos lcteos sin grasa o con bajo contenido de [place], [place], yogur y [place]. Es posible que los productos detallados arriba no constituyan una lista completa de los alimentos y las bebidas que puede tomar. Consulte a un nutricionista para obtener ms informacin. Qu alimentos debo evitar? Nils Pyle Frutas enlatadas al almbar. Verduras Verduras enlatadas. Verduras congeladas con mantequilla o salsa de crema. Granos Productos elaborados con [place] y [place], como panes, pastas, bocadillos y cereales. Evite todos los alimentos procesados. Carnes y [address] de carne con alto contenido de Holiday representative. Carne de ave con piel. Carnes empanizadas o fritas. Carne procesada. Evite las grasas saturadas. Lcteos Yogur, queso o [employer] Health. Bebidas Bebidas azucaradas, como gaseosas o t helado. Es posible que los productos que se enumeran ms [place] no constituyan una lista completa de los alimentos y las bebidas que Personnel officer. Consulte a un nutricionista para obtener ms informacin. Preguntas para hacerle al mdico Debo consultar con un especialista certificado en atencin y educacin sobre la diabetes? Es necesario que me rena con un nutricionista? A qu nmero puedo llamar si tengo preguntas? Cules son los mejores momentos para controlar la glucemia? Dnde encontrar ms informacin: American Diabetes Association (Asociacin Estadounidense de la Diabetes): diabetes.org Academy of Nutrition and Dietetics (Academia de Nutricin y Pension scheme manager): eatright.Dana [employer] of Diabetes and Digestive and Kidney Diseases [employer] de la Diabetes y las Enfermedades Digestivas y Renales): StageSync.si Association of Diabetes Care & Education Specialists (Asociacin de Especialistas en Atencin y Educacin sobre la Diabetes): diabeteseducator.org Resumen Es importante tener hbitos alimenticios  saludables debido a que sus niveles de Psychologist, counselling sangre (glucosa) se ven afectados en gran medida por lo que come y bebe. Es importante consumir alcohol con prudencia. Un plan de comidas saludable lo ayudar a controlar la glucosa en sangre y a reducir el riesgo de enfermedades cardacas. El mdico puede recomendarle que trabaje con un nutricionista para elaborar el mejor plan para usted. Esta informacin no tiene Theme park manager el consejo del mdico. Asegrese de hacerle al mdico cualquier pregunta que tenga. Document Revised: 09/19/2019 Document Reviewed: 09/19/2019 Elsevier Patient Education  2022 Elsevier Inc.     Edwina Barth, MD [facility]

## 2021-01-22 NOTE — Patient Instructions (Signed)
Diabetes mellitus y nutricin, en adultos Diabetes Mellitus and Nutrition, Adult Si sufre de diabetes, o diabetes mellitus, es muy importante tener hbitos alimenticios saludables debido a que sus niveles de azcar en la sangre (glucosa) se ven afectados en gran medida por lo que come y bebe. [place] alimentos saludables en las cantidades correctas, aproximadamente a la misma hora todos los das, lo ayudar a: Controlar su glucemia. Disminuir el riesgo de sufrir una enfermedad cardaca. Mejorar la presin arterial. Alcanzar o mantener un peso saludable. Qu puede afectar mi plan de alimentacin? Todas las personas que sufren de diabetes son diferentes y cada una tiene necesidades diferentes en cuanto a un plan de alimentacin. El mdico puede recomendarle que trabaje con un nutricionista para elaborar el mejor plan para usted. Su plan de alimentacin puede variar segn factores como: Las caloras que necesita. Los medicamentos que toma. Su peso. Sus niveles de glucemia, presin arterial y colesterol. Su nivel de actividad. Otras afecciones que tenga, como enfermedades cardacas o renales. Cmo me afectan los carbohidratos? Los carbohidratos, o hidratos de carbono, afectan su nivel de glucemia ms que cualquier otro tipo de alimento. La ingesta de carbohidratos aumenta la cantidad de glucosa en la sangre. Es importante conocer la cantidad de carbohidratos que se pueden ingerir en cada comida sin correr ningn riesgo. Esto es diferente en cada persona. Su nutricionista puede ayudarlo a calcular la cantidad de carbohidratos que debe ingerir en cada comida y en cada refrigerio. Cmo me afecta el alcohol? El alcohol puede provocar una disminucin de la glucemia (hipoglucemia), especialmente si usa insulina o toma determinados medicamentos por va oral para la diabetes. La hipoglucemia es una afeccin potencialmente mortal. Los sntomas de la hipoglucemia, como somnolencia, mareos y confusin, son  similares a los sntomas de haber consumido demasiado alcohol. No beba alcohol si: Su mdico le indica no hacerlo. Est embarazada, puede estar embarazada o est tratando de quedar embarazada. Si bebe alcohol: Limite la cantidad que bebe a lo siguiente: De 0 a 1 medida por da para las mujeres. De 0 a 2 medidas por da para los hombres. Sepa cunta cantidad de alcohol hay en las bebidas que toma. En [place], una medida equivale a una botella de cerveza de 12 oz (355 ml), un vaso de vino de 5 oz (148 ml) o un vaso de una bebida alcohlica de alta graduacin de 1 oz (44 ml). Mantngase hidratado bebiendo agua, refrescos dietticos o t helado sin azcar. Tenga en cuenta que los refrescos comunes, los jugos y otras bebidas para mezclar pueden contener mucha azcar y se deben contar como carbohidratos. Consejos para seguir este plan Leer las etiquetas de los alimentos Comience por leer el tamao de la porcin en la etiqueta de Informacin nutricional de los alimentos envasados y las bebidas. La cantidad de caloras, carbohidratos, grasas y otros nutrientes detallados en la etiqueta se basan en una porcin del alimento. Muchos alimentos contienen ms de una porcin por envase. Verifique la cantidad total de gramos (g) de carbohidratos totales en una porcin. Verifique la cantidad de gramos de grasas saturadas y grasas trans en una porcin. Escoja alimentos que no contengan estas grasas o que su contenido de estas sea bajo. Verifique la cantidad de miligramos (mg) de sal (sodio) en una porcin. La mayora de las personas deben limitar la ingesta de sodio total a menos de 2300 mg por da. Siempre consulte la informacin nutricional de los alimentos etiquetados como "con bajo contenido de grasa" o "sin grasa". Estos   alimentos pueden tener un mayor contenido de azcar agregada o carbohidratos refinados, y deben evitarse. Hable con su nutricionista para identificar sus objetivos diarios en cuanto  a los nutrientes mencionados en la etiqueta. Al ir de compras Evite comprar alimentos procesados, enlatados o precocidos. Estos alimentos tienden a tener una mayor cantidad de grasa, sodio y azcar agregada. Compre en la zona exterior de la tienda de comestibles. Esta es la zona donde se encuentran con mayor frecuencia las frutas y las verduras frescas, los cereales a granel, las carnes frescas y los productos lcteos frescos. Al cocinar Use mtodos de coccin a baja temperatura, como hornear, en lugar de mtodos de coccin a alta temperatura, como frer en abundante aceite. Cocine con aceites saludables, como el aceite de oliva, canola o girasol. Evite cocinar con manteca, crema o carnes con alto contenido de grasa. Planificacin de las comidas Coma las comidas y los refrigerios regularmente, preferentemente a la misma hora todos los das. Evite pasar largos perodos de tiempo sin comer. Consuma alimentos ricos en fibra, como frutas frescas, verduras, frijoles y cereales integrales. Consuma entre 4 y 6 onzas (entre 112 y 168 g) de protenas magras por da, como carnes magras, pollo, pescado, huevos o tofu. Una onza (oz) (28 g) de protena magra equivale a: 1 onza (28 g) de carne, pollo o pescado. 1 huevo.  taza (62 g) de tofu. Coma algunos alimentos por da que contengan grasas saludables, como aguacates, frutos secos, semillas y pescado. Qu alimentos debo comer? Frutas Bay[place]. Manzanas. Naranjas. Duraznos. Damascos. Ciruelas. Uvas. Mangos. Papayas. Granadas. Kiwi. Cerezas. Verduras Verduras de hoja verde, que incluyen lechuga, espinaca, col rizada, acelga, hojas de berza, hojas de mostaza y repollo. Remolachas. Coliflor. Brcoli. Zanahorias. Judas verdes. Tomates. Pimientos. Cebollas. Pepinos. Coles de Bruselas. Granos Granos integrales, como panes, galletas, tortillas, cereales y pastas de salvado o integrales. Avena sin azcar. Quinua. Arroz integral o salvaje. Carnes y otras  protenas Frutos de mar. Carne de ave sin piel. Cortes magros de ave y carne de res. Tofu. Frutos secos. Semillas. Lcteos Productos lcteos sin grasa o con bajo contenido de grasa, como leche, yogur y queso. Es posible que los productos detallados arriba no constituyan una lista completa de los alimentos y las bebidas que puede tomar. Consulte a un nutricionista para obtener ms informacin. Qu alimentos debo evitar? Frutas Frutas enlatadas al almbar. Verduras Verduras enlatadas. Verduras congeladas con mantequilla o salsa de crema. Granos Productos elaborados con harina y harina blanca refinada, como panes, pastas, bocadillos y cereales. Evite todos los alimentos procesados. Carnes y otras protenas Cortes de carne con alto contenido de grasa. Carne de ave con piel. Carnes empanizadas o fritas. Carne procesada. Evite las grasas saturadas. Lcteos Yogur, queso o leche enteros. Bebidas Bebidas azucaradas, como gaseosas o t helado. Es posible que los productos que se enumeran ms arriba no constituyan una lista completa de los alimentos y las bebidas que debe evitar. Consulte a un nutricionista para obtener ms informacin. Preguntas para hacerle al mdico Debo consultar con un especialista certificado en atencin y educacin sobre la diabetes? Es necesario que me rena con un nutricionista? A qu nmero puedo llamar si tengo preguntas? Cules son los mejores momentos para controlar la glucemia? Dnde encontrar ms informacin: American Diabetes Association (Asociacin Estadounidense de la Diabetes): diabetes.org Academy of Nutrition and Dietetics (Academia de Nutricin y Diettica): eatright.org National Institute of Diabetes and Digestive and Kidney Diseases (Instituto Nacional de la Diabetes y las Enfermedades Digestivas y Renales): niddk.nih.gov Association of Diabetes Care &   Education Specialists (Asociacin de Especialistas en Atencin y Educacin sobre la Diabetes):  diabeteseducator.org Resumen Es importante tener hbitos alimenticios saludables debido a que sus niveles de azcar en la sangre (glucosa) se ven afectados en gran medida por lo que come y bebe. Es importante consumir alcohol con prudencia. Un plan de comidas saludable lo ayudar a controlar la glucosa en sangre y a reducir el riesgo de enfermedades cardacas. El mdico puede recomendarle que trabaje con un nutricionista para elaborar el mejor plan para usted. Esta informacin no tiene como fin reemplazar el consejo del mdico. Asegrese de hacerle al mdico cualquier pregunta que tenga. Document Revised: 09/19/2019 Document Reviewed: 09/19/2019 Elsevier Patient Education  2022 Elsevier Inc.  

## 2021-01-22 NOTE — Assessment & Plan Note (Signed)
Gaining weight.  Continue metformin. May benefit from Ozempic 0.5 mg weekly. Diet and nutrition discussed Follow-up in 3 months.

## 2021-02-11 ENCOUNTER — Telehealth: Payer: Self-pay

## 2021-02-11 NOTE — Telephone Encounter (Signed)
Pt is calling stating that the medication that she was prescribed 01/22/22:  hydrochlorothiazide (HYDRODIURIL) 12.5 MG tablet Semaglutide,0.25 or 0.5MG /DOS, (OZEMPIC, 0.25 OR 0.5 MG/DOSE,) 2 MG/1.5ML SOPN She is have side effects from one or both of them. Side Effects dizziness, nauseous, diarrhea, weak, feeling like she will pass out.  Please advise the patient of what she needs to do. Pt is currently still taking both medications  CB: [phone]

## 2021-02-14 NOTE — Telephone Encounter (Signed)
Recommend office visit for reassessment as soon as possible. In the meantime stop hydrochlorothiazide.  Continue Ozempic. Thanks.

## 2021-02-16 NOTE — Telephone Encounter (Signed)
Called and left a vm about providers recommendations.

## 2021-02-26 ENCOUNTER — Other Ambulatory Visit: Payer: Self-pay | Admitting: Emergency Medicine

## 2021-02-26 DIAGNOSIS — E1165 Type 2 diabetes mellitus with hyperglycemia: Secondary | ICD-10-CM

## 2021-03-03 ENCOUNTER — Other Ambulatory Visit: Payer: Self-pay | Admitting: Emergency Medicine

## 2021-03-03 DIAGNOSIS — E1165 Type 2 diabetes mellitus with hyperglycemia: Secondary | ICD-10-CM

## 2021-03-03 MED ORDER — DAPAGLIFLOZIN PROPANEDIOL 5 MG PO TABS: 5.0000 mg | ORAL_TABLET | Freq: Every day | ORAL | 3 refills | Status: DC

## 2021-03-03 NOTE — Telephone Encounter (Signed)
We will start Farxiga 5 mg daily in the meantime.  New prescription sent to pharmacy of record.  Thanks.

## 2021-03-03 NOTE — Telephone Encounter (Signed)
Called and left vm about medication.

## 2021-03-03 NOTE — Telephone Encounter (Signed)
Pt states she is no longer having side effects to hydrochlorothiazide and OZEMPIC, pt declined reassessment appt recommended by provider   Pt stated OZEMPIC is on back order and requesting an alternative medication  Pt requesting a c/b to discuss request

## 2021-04-23 ENCOUNTER — Encounter: Payer: Self-pay | Admitting: Emergency Medicine

## 2021-04-23 ENCOUNTER — Ambulatory Visit (INDEPENDENT_AMBULATORY_CARE_PROVIDER_SITE_OTHER): Payer: 59 | Admitting: Emergency Medicine

## 2021-04-23 VITALS — BP 124/76 | HR 67 | Temp 98.2°F | Ht 64.0 in | Wt 220.2 lb

## 2021-04-23 DIAGNOSIS — E1165 Type 2 diabetes mellitus with hyperglycemia: Secondary | ICD-10-CM

## 2021-04-23 DIAGNOSIS — E1159 Type 2 diabetes mellitus with other circulatory complications: Secondary | ICD-10-CM | POA: Diagnosis not present

## 2021-04-23 DIAGNOSIS — I10 Essential (primary) hypertension: Secondary | ICD-10-CM | POA: Diagnosis not present

## 2021-04-23 DIAGNOSIS — I152 Hypertension secondary to endocrine disorders: Secondary | ICD-10-CM

## 2021-04-23 DIAGNOSIS — Z1211 Encounter for screening for malignant neoplasm of colon: Secondary | ICD-10-CM

## 2021-04-23 DIAGNOSIS — E039 Hypothyroidism, unspecified: Secondary | ICD-10-CM

## 2021-04-23 LAB — COMPREHENSIVE METABOLIC PANEL
ALT: 63 U/L — ABNORMAL HIGH (ref 0–35)
AST: 37 U/L (ref 0–37)
Albumin: 4.3 g/dL (ref 3.5–5.2)
Alkaline Phosphatase: 81 U/L (ref 39–117)
BUN: 9 mg/dL (ref 6–23)
CO2: 27 mEq/L (ref 19–32)
Calcium: 9.5 mg/dL (ref 8.4–10.5)
Chloride: 103 mEq/L (ref 96–112)
Creatinine, Ser: 0.59 mg/dL (ref 0.40–1.20)
GFR: 107.75 mL/min (ref >60.00)
Glucose, Bld: 93 mg/dL (ref 70–99)
Potassium: 3.7 mEq/L (ref 3.5–5.1)
Sodium: 138 mEq/L (ref 135–145)
Total Bilirubin: 0.4 mg/dL (ref 0.2–1.2)
Total Protein: 7.5 g/dL (ref 6.0–8.3)

## 2021-04-23 LAB — MICROALBUMIN / CREATININE URINE RATIO
Creatinine,U: 36.7 mg/dL
Microalb Creat Ratio: 2 mg/g (ref 0.0–30.0)
Microalb, Ur: 0.7 mg/dL (ref 0.0–1.9)

## 2021-04-23 LAB — LIPID PANEL
Cholesterol: 146 mg/dL (ref 0–200)
HDL: 41.9 mg/dL (ref >39.00)
LDL Cholesterol: 76 mg/dL (ref 0–99)
NonHDL: 103.92
Total CHOL/HDL Ratio: 3
Triglycerides: 141 mg/dL (ref 0.0–149.0)
VLDL: 28.2 mg/dL (ref 0.0–40.0)

## 2021-04-23 LAB — HEMOGLOBIN A1C: Hgb A1c MFr Bld: 5.8 % (ref 4.6–6.5)

## 2021-04-23 MED ORDER — SIMVASTATIN 20 MG PO TABS: 20.0000 mg | ORAL_TABLET | Freq: Every day | ORAL | 3 refills | Status: DC

## 2021-04-23 NOTE — Patient Instructions (Signed)
Diabetes mellitus y nutrici?n, en adultos ?Diabetes Mellitus and Nutrition, Adult ?Si sufre de diabetes, o diabetes mellitus, es muy importante tener h?bitos alimenticios saludables debido a que sus niveles de az?car en la sangre (glucosa) se ven afectados en gran medida por lo que come y bebe. [place] alimentos saludables en las cantidades correctas, aproximadamente a la misma hora todos los d?as, lo ayudar? a: ?Controlar su glucemia. ?Disminuir el riesgo de sufrir una enfermedad card?aca. ?Mejorar la presi?n arterial. ?Alcanzar o mantener un peso saludable. ??Qu? puede afectar mi plan de alimentaci?n? ?Todas las personas que sufren de diabetes son diferentes y cada una tiene necesidades diferentes en cuanto a un plan de alimentaci?n. El m?dico puede recomendarle que trabaje con un nutricionista para elaborar el mejor plan para usted. Su plan de alimentaci?n puede variar seg?n factores como: ?Las calor?as que necesita. ?Los medicamentos que toma. ?Su peso. ?Sus niveles de glucemia, presi?n arterial y colesterol. ?Su nivel de actividad. ?Otras afecciones que tenga, como enfermedades card?acas o renales. ??C?mo me afectan los carbohidratos? ?Los carbohidratos, o hidratos de carbono, afectan su nivel de glucemia m?s que cualquier otro tipo de alimento. La ingesta de carbohidratos aumenta la cantidad de glucosa en la sangre. ?Es importante conocer la cantidad de carbohidratos que se pueden ingerir en cada comida sin correr ning?n riesgo. Esto es diferente en cada persona. Su nutricionista puede ayudarlo a calcular la cantidad de carbohidratos que debe ingerir en cada comida y en cada refrigerio. ??C?mo me afecta el alcohol? ?El alcohol puede provocar una disminuci?n de la glucemia (hipoglucemia), especialmente si usa insulina o toma determinados medicamentos por v?a oral para la diabetes. La hipoglucemia es una afecci?n potencialmente mortal. Los s?ntomas de la hipoglucemia, como somnolencia, mareos y confusi?n, son  similares a los s?ntomas de haber consumido demasiado alcohol. ?No beba alcohol si: ?Su m?dico le indica no hacerlo. ?Est? embarazada, puede estar embarazada o est? tratando de quedar embarazada. ?Si bebe alcohol: ?Limite la cantidad que bebe a lo siguiente: ?De 0 a 1 medida por d?a para las mujeres. ?De 0 a 2 medidas por d?a para los hombres. ?Sepa cu?nta cantidad de alcohol hay en las bebidas que toma. [place], una medida equivale a una botella de cerveza de 12 oz (355 ml), un vaso de vino de 5 oz (148 ml) o un vaso de una bebida alcoh?lica de alta graduaci?n de 1? oz (44 ml). ?Mant?ngase hidratado bebiendo agua, refrescos diet?ticos o t? helado sin az?car. Tenga en cuenta que los refrescos comunes, los jugos y otras bebidas para mezclar pueden contener mucha az?car y se deben contar como carbohidratos. ?Consejos para seguir este plan ?Leer las etiquetas de los alimentos ?Comience por leer el tama?o de la porci?n en la etiqueta de Informaci?n nutricional de los alimentos envasados y las bebidas. La cantidad de calor?as, carbohidratos, grasas y otros nutrientes detallados en la etiqueta se basan en una porci?n del alimento. Muchos alimentos contienen m?s de una porci?n por envase. ?Verifique la cantidad total de gramos (g) de carbohidratos totales en una porci?n. ?Verifique la cantidad de gramos de grasas saturadas y grasas trans en una porci?n. Escoja alimentos que no contengan estas grasas o que su contenido de estas sea bajo. ?Verifique la cantidad de miligramos (mg) de sal (sodio) en una porci?n. La mayor?a de las personas deben limitar la ingesta de sodio total a menos de 2300 mg por d?a. ?Siempre consulte la informaci?n nutricional de los alimentos etiquetados como ?con bajo contenido de grasa? o ?sin grasa?. Estos   alimentos pueden tener un mayor contenido de az?car agregada o carbohidratos refinados, y deben evitarse. ?Hable con su nutricionista para identificar sus objetivos diarios en cuanto  a los nutrientes mencionados en la etiqueta. ?Al ir de compras ?Evite comprar alimentos procesados, enlatados o precocidos. Estos alimentos tienden a tener una mayor cantidad de grasa, sodio y az?car agregada. ?Compre en la zona exterior de la tienda de comestibles. Esta es la zona donde se encuentran con mayor frecuencia las frutas y las verduras frescas, los cereales a granel, las carnes frescas y los productos l?cteos frescos. ?Al cocinar ?Use m?todos de cocci?n a baja temperatura, como hornear, en lugar de m?todos de cocci?n a alta temperatura, como fre?r en abundante aceite. ?Cocine con aceites saludables, como el aceite de oliva, canola o girasol. ?Evite cocinar con manteca, crema o carnes con alto contenido de grasa. ?Planificaci?n de las comidas ?Coma las comidas y los refrigerios regularmente, preferentemente a la misma hora todos los d?as. Evite pasar largos per?odos de tiempo sin comer. ?Consuma alimentos ricos en fibra, como frutas frescas, verduras, frijoles y cereales integrales. ?Consuma entre 4 y 6 onzas (entre 112 y 168 g) de prote?nas magras por d?a, como carnes magras, pollo, pescado, huevos o tofu. Una onza (oz) (28 g) de prote?na magra equivale a: ?1 onza (28 g) de carne, pollo o pescado. ?1 huevo. ?? taza (62 g) de tofu. ?Coma algunos alimentos por d?a que contengan grasas saludables, como aguacates, frutos secos, semillas y pescado. ??Qu? alimentos debo comer? ?Frutas ?Bayas. Manzanas. Naranjas. Duraznos. Damascos. Ciruelas. Uvas. Mangos. Papayas. Granadas. Kiwi. Cerezas. ?Verduras ?Verduras de hoja verde, que incluyen lechuga, espinaca, col rizada, acelga, hojas de berza, hojas de mostaza y repollo. Remolachas. Coliflor. Br?coli. Zanahorias. Jud?as verdes. Tomates. Pimientos. Cebollas. Pepinos. Coles de Bruselas. ?Granos ?Granos integrales, como panes, galletas, tortillas, cereales y pastas de salvado o integrales. Avena sin az?car. Quinua. Arroz integral o salvaje. ?Carnes y otras  prote?nas ?Frutos de mar. Carne de ave sin piel. Cortes magros de ave y carne de res. Tofu. Frutos secos. Semillas. ?L?cteos ?Productos l?cteos sin grasa o con bajo contenido de grasa, como leche, yogur y queso. ?Es posible que los productos detallados arriba no constituyan una lista completa de los alimentos y las bebidas que puede tomar. Consulte a un nutricionista para obtener m?s informaci?n. ??Qu? alimentos debo evitar? ?Frutas ?Frutas enlatadas al alm?bar. ?Verduras ?Verduras enlatadas. Verduras congeladas con mantequilla o salsa de crema. ?Granos ?Productos elaborados con harina y harina blanca refinada, como panes, pastas, bocadillos y cereales. Evite todos los alimentos procesados. ?Carnes y otras prote?nas ?Cortes de carne con alto contenido de grasa. Carne de ave con piel. Carnes empanizadas o fritas. Carne procesada. Evite las grasas saturadas. ?L?cteos ?Yogur, queso o leche enteros. ?Bebidas ?Bebidas azucaradas, como gaseosas o t? helado. ?Es posible que los productos que se enumeran m?s arriba no constituyan una lista completa de los alimentos y las bebidas que debe evitar. Consulte a un nutricionista para obtener m?s informaci?n. ?Preguntas para hacerle al m?dico ??Debo consultar con un especialista certificado en atenci?n y educaci?n sobre la diabetes? ??Es necesario que me re?na con un nutricionista? ??A qu? n?mero puedo llamar si tengo preguntas? ??Cu?les son los mejores momentos para controlar la glucemia? ?D?nde encontrar m?s informaci?n: ?American Diabetes Association (Asociaci?n Estadounidense de la Diabetes): diabetes.org ?Academy of Nutrition and Dietetics (Academia de Nutrici?n y Diet?tica): eatright.org ?National Institute of Diabetes and Digestive and Kidney Diseases (Instituto Nacional de la Diabetes y las Enfermedades Digestivas y Renales): niddk.nih.gov ?Association of Diabetes Care &   Education Specialists (Asociaci?n de Especialistas en Atenci?n y Educaci?n sobre la Diabetes):  diabeteseducator.org ?Resumen ?Es importante tener h?bitos alimenticios saludables debido a que sus niveles de az?car en la sangre (glucosa) se ven afectados en gran medida por lo que come y bebe. Es importante consumir al

## 2021-04-23 NOTE — Assessment & Plan Note (Signed)
Well-controlled hypertension.  Continue hydrochlorothiazide 12.5 mg daily. ?BP Readings from Last 3 Encounters:  ?04/23/21 124/76  ?01/22/21 134/82  ?10/21/20 130/80  ?Well-controlled diabetes.  Hemoglobin A1c done today. ?Continue weekly Ozempic 0.5 mg and metformin 500 mg twice a day. ?No longer taking [place]. ?Diet and nutrition discussed. ?Advised to start taking simvastatin 20 mg daily. ?Follow-up in 6 months. ? ? ?

## 2021-04-23 NOTE — Assessment & Plan Note (Addendum)
Clinically euthyroid.  Continue Synthroid 50 mcg daily. ?Lab Results  ?Component Value Date  ? TSH 4.49 10/21/2020  ?TSH done today. ? ?

## 2021-04-23 NOTE — Progress Notes (Signed)
Jasmine Howard ?46 y.o. ? ? ?Chief Complaint  ?Patient presents with  ? Follow-up  ?  No concerns  ? ? ?HISTORY OF PRESENT ILLNESS: ?This is a 46 y.o. female with history of diabetes and hypertension here for follow-up. ?Last visit with me on 01/22/2021 when she was started on hydrochlorothiazide 12.5 mg daily for hypertension. ?Doing well and feeling better. ?Wallene Huh for a while but she is now back on Ozempic and metformin.  No longer taking [place]. ?Cost issues. ?No complaints or medical concerns today. ? ?HPI ? ? ?Prior to Admission medications   ?Medication Sig Start Date End Date Taking? Authorizing Provider  ?dapagliflozin propanediol (FARXIGA) 5 MG TABS tablet Take 1 tablet (5 mg total) by mouth daily before breakfast. 03/03/21 06/01/21 Yes Munira Polson, Eilleen Kempf, MD  ?hydrochlorothiazide (HYDRODIURIL) 12.5 MG tablet Take 1 tablet (12.5 mg total) by mouth daily. 01/22/21  Yes Naeem Quillin, Eilleen Kempf, MD  ?levonorgestrel (MIRENA) 20 MCG/24HR IUD 1 Intra Uterine Device (1 each total) by Intrauterine route once. 10/25/12  Yes Newt Lukes, MD  ?levothyroxine (SYNTHROID) 50 MCG tablet Take 1 tablet (50 mcg total) by mouth daily before breakfast. 10/21/20  Yes Natania Finigan, Eilleen Kempf, MD  ?metFORMIN (GLUCOPHAGE-XR) 500 MG 24 hr tablet Take 1 tablet (500 mg total) by mouth 2 (two) times daily with a meal. 10/21/20  Yes Janus Vlcek, Eilleen Kempf, MD  ?OZEMPIC, 0.25 OR 0.5 MG/DOSE, 2 MG/1.5ML SOPN INJECT 0.5 MG INTO THE SKIN ONCE A WEEK. 02/26/21  Yes Georgina Quint, MD  ? ? ?No Known Allergies ? ?Patient Active Problem List  ? Diagnosis Date Noted  ? Essential hypertension 01/22/2021  ? Edema 01/22/2021  ? Elevated blood pressure reading 11/21/2015  ? Headache 03/29/2014  ? Hypothyroidism 12/10/2009  ? Type 2 diabetes mellitus with hyperglycemia, without long-term current use of insulin (HCC) 12/10/2009  ? Irritable bowel syndrome 03/31/2009  ? FATTY LIVER DISEASE 03/31/2009  ? ? ?Past Medical History:   ?Diagnosis Date  ? ANEMIA-NOS   ? BENIGN POSITIONAL VERTIGO   ? Cervical incompetence   ? s/p cerclage 03/01/12  ? DEPRESSION   ? DIABETES MELLITUS, TYPE II   ? metformin  ? FATTY LIVER DISEASE   ? HYPOTHYROIDISM   ? Infertility, female   ? IRITIS   ? Irritable bowel syndrome   ? Polycystic ovaries   ? Postpartum care following cesarean delivery (breech 11/16) 12/12/2010  ? ? ?Past Surgical History:  ?Procedure Laterality Date  ? CERVICAL CERCLAGE N/A 03/03/2012  ? Procedure: CERCLAGE CERVICAL;  Surgeon: Alphonsus Sias. Ernestina Penna, MD;  Location: [facility];  Service: Gynecology;  Laterality: N/A;  EDD:  09/07/12  ? CESAREAN SECTION  12/11/2010  ? Procedure: CESAREAN SECTION;  Surgeon: Alphonsus Sias. Fogleman;  Location: [facility];  Service: Gynecology;  Laterality: N/A;  ? CESAREAN SECTION N/A 09/01/2012  ? Procedure: Repeat CESAREAN SECTION;  Surgeon: Tresa Endo A. Ernestina Penna, MD;  Location: [facility];  Service: Obstetrics;  Laterality: N/A;  EDD: 09/07/12  ? CHOLECYSTECTOMY  2009  ? SCAR REVISION  09/01/2012  ? Procedure: SCAR REVISION;  Surgeon: Tresa Endo A. Ernestina Penna, MD;  Location: [facility];  Service: Obstetrics;;  cesarean section scar revision  ? ? ?Social History  ? ?Socioeconomic History  ? Marital status: Married  ?  Spouse name: Not on file  ? Number of children: Not on file  ? Years of education: Not on file  ? Highest education level: Not on file  ?Occupational History  ? Not on file  ?  Tobacco Use  ? Smoking status: Never  ? Smokeless tobacco: Never  ? Tobacco comments:  ?  Married, lives with spouse and 2 kids. 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 Alcohol use: No  ? Drug use: No  ? Sexual activity: Never  ?Other Topics Concern  ? Not on file  ?Social History Narrative  ? Not on file  ? ?Social Determinants of Health  ? ?Financial Resource Strain: Not on file  ?Food Insecurity: Not on file  ?Transportation Needs: Not on file  ?Physical Activity: Not on file  ?Stress: Not on file  ?Social Connections: Not  on file  ?Intimate Partner Violence: Not on file  ? ? ?Family History  ?Problem Relation Age of Onset  ? Arthritis Other   ?     Parent  ? Alcohol abuse Other   ?     Parent  ? ? ? ?Review of Systems  ?Constitutional: Negative.  Negative for chills and fever.  ?HENT: Negative.  Negative for congestion and sore throat.   ?Respiratory: Negative.  Negative for cough and shortness of breath.   ?Cardiovascular: Negative.  Negative for chest pain and palpitations.  ?Gastrointestinal:  Negative for abdominal pain, diarrhea, nausea and vomiting.  ?Genitourinary: Negative.  Negative for dysuria and hematuria.  ?Skin: Negative.  Negative for rash.  ?Neurological: Negative.  Negative for dizziness and headaches.  ?All other systems reviewed and are negative. ? ?Today's Vitals  ? 04/23/21 1116  ?BP: 124/76  ?Pulse: 67  ?Temp: 98.2 ?F (36.8 ?C)  ?SpO2: 97%  ?Weight: 220 lb 4 oz (99.9 kg)  ?Height: 5\' 4"  (1.626 m)  ? ?Body mass index is 37.81 kg/m?. ?Wt Readings from Last 3 Encounters:  ?04/23/21 220 lb 4 oz (99.9 kg)  ?01/22/21 226 lb (102.5 kg)  ?10/21/20 218 lb (98.9 kg)  ? ? ?Physical Exam ?Vitals reviewed.  ?Constitutional:   ?   Appearance: Normal appearance.  ?HENT:  ?   Head: Normocephalic.  ?Eyes:  ?   Extraocular Movements: Extraocular movements intact.  ?   Pupils: Pupils are equal, round, and reactive to light.  ?Cardiovascular:  ?   Rate and Rhythm: Normal rate and regular rhythm.  ?   Pulses: Normal pulses.  ?   Heart sounds: Normal heart sounds.  ?Pulmonary:  ?   Effort: Pulmonary effort is normal.  ?   Breath sounds: Normal breath sounds.  ?Musculoskeletal:     ?   General: Normal range of motion.  ?   Cervical back: No tenderness.  ?   Right lower leg: No edema.  ?Lymphadenopathy:  ?   Cervical: No cervical adenopathy.  ?Skin: ?   General: Skin is warm and dry.  ?   Capillary Refill: Capillary refill takes less than 2 seconds.  ?Neurological:  ?   General: No focal deficit present.  ?   Mental Status: She is  alert and oriented to person, place, and time.  ?Psychiatric:     ?   Mood and Affect: Mood normal.     ?   Behavior: Behavior normal.  ? ? ? ?ASSESSMENT & PLAN: ?Problem List Items Addressed This Visit   ? ?  ? Cardiovascular and Mediastinum  ? Hypertension associated with diabetes (HCC) - Primary  ?  Well-controlled hypertension.  Continue hydrochlorothiazide 12.5 mg daily. ?BP Readings from Last 3 Encounters:  ?04/23/21 124/76  ?01/22/21 134/82  ?10/21/20 130/80  ?Well-controlled diabetes.  Hemoglobin A1c  done today. ?Continue weekly Ozempic 0.5 mg and metformin 500 mg twice a day. ?No longer taking [place]. ?Diet and nutrition discussed. ?Advised to start taking simvastatin 20 mg daily. ?Follow-up in 6 months. ? ? ?  ?  ? Relevant Medications  ? simvastatin (ZOCOR) 20 MG tablet  ? Other Relevant Orders  ? Urine Microalbumin w/creat. ratio  ? Comprehensive metabolic panel  ? Hemoglobin A1c  ? Lipid panel  ?  ? Endocrine  ? Hypothyroidism  ?  Clinically euthyroid.  Continue Synthroid 50 mcg daily. ?Lab Results  ?Component Value Date  ? TSH 4.49 10/21/2020  ?TSH done today. ? ?  ?  ? Relevant Orders  ? TSH  ? Type 2 diabetes mellitus with hyperglycemia, without long-term current use of insulin (HCC)  ? Relevant Medications  ? simvastatin (ZOCOR) 20 MG tablet  ? ?Other Visit Diagnoses   ? ? Colon cancer screening      ? Relevant Orders  ? Ambulatory referral to Gastroenterology  ? ?  ? ? ? ?Patient Instructions  ?Diabetes mellitus y nutrici?n, en adultos ?Diabetes Mellitus and Nutrition, Adult ?Si sufre de diabetes, o diabetes mellitus, es muy importante tener h?bitos alimenticios saludables debido a que sus niveles de az?car en la sangre (glucosa) se ven afectados en gran medida por lo que come y bebe. [place] alimentos saludables en las cantidades correctas, aproximadamente a la misma hora todos los d?as, lo ayudar? a: ?Controlar su glucemia. ?Disminuir el riesgo de sufrir una enfermedad card?aca. ?Mejorar la  presi?n arterial. ?Alcanzar o mantener un peso saludable. ??Qu? puede afectar mi plan de alimentaci?n? ?Todas las personas que sufren de diabetes son diferentes y cada una tiene necesidades diferentes en cuanto a un pla

## 2021-10-06 ENCOUNTER — Encounter: Payer: Self-pay | Admitting: Emergency Medicine

## 2021-10-06 ENCOUNTER — Ambulatory Visit (INDEPENDENT_AMBULATORY_CARE_PROVIDER_SITE_OTHER): Payer: Commercial Managed Care - PPO | Admitting: Emergency Medicine

## 2021-10-06 VITALS — BP 110/68 | HR 74 | Temp 98.1°F | Ht 64.0 in | Wt 213.0 lb

## 2021-10-06 DIAGNOSIS — I152 Hypertension secondary to endocrine disorders: Secondary | ICD-10-CM

## 2021-10-06 DIAGNOSIS — E039 Hypothyroidism, unspecified: Secondary | ICD-10-CM

## 2021-10-06 DIAGNOSIS — E1159 Type 2 diabetes mellitus with other circulatory complications: Secondary | ICD-10-CM

## 2021-10-06 DIAGNOSIS — B349 Viral infection, unspecified: Secondary | ICD-10-CM | POA: Diagnosis not present

## 2021-10-06 DIAGNOSIS — R531 Weakness: Secondary | ICD-10-CM | POA: Diagnosis not present

## 2021-10-06 DIAGNOSIS — Z23 Encounter for immunization: Secondary | ICD-10-CM

## 2021-10-06 DIAGNOSIS — R1030 Lower abdominal pain, unspecified: Secondary | ICD-10-CM

## 2021-10-06 LAB — COMPREHENSIVE METABOLIC PANEL
ALT: 41 U/L — ABNORMAL HIGH (ref 0–35)
AST: 24 U/L (ref 0–37)
Albumin: 4.3 g/dL (ref 3.5–5.2)
Alkaline Phosphatase: 71 U/L (ref 39–117)
BUN: 11 mg/dL (ref 6–23)
CO2: 27 mEq/L (ref 19–32)
Calcium: 9.7 mg/dL (ref 8.4–10.5)
Chloride: 101 mEq/L (ref 96–112)
Creatinine, Ser: 0.61 mg/dL (ref 0.40–1.20)
GFR: 106.55 mL/min (ref >60.00)
Glucose, Bld: 119 mg/dL — ABNORMAL HIGH (ref 70–99)
Potassium: 3.4 mEq/L — ABNORMAL LOW (ref 3.5–5.1)
Sodium: 137 mEq/L (ref 135–145)
Total Bilirubin: 0.4 mg/dL (ref 0.2–1.2)
Total Protein: 8.1 g/dL (ref 6.0–8.3)

## 2021-10-06 LAB — CBC WITH DIFFERENTIAL/PLATELET
Basophils Absolute: 0 10*3/uL (ref 0.0–0.1)
Basophils Relative: 0.4 % (ref 0.0–3.0)
Eosinophils Absolute: 0.3 10*3/uL (ref 0.0–0.7)
Eosinophils Relative: 3.3 % (ref 0.0–5.0)
HCT: 39.6 % (ref 36.0–46.0)
Hemoglobin: 13.7 g/dL (ref 12.0–15.0)
Lymphocytes Relative: 23 % (ref 12.0–46.0)
Lymphs Abs: 1.9 10*3/uL (ref 0.7–4.0)
MCHC: 34.6 g/dL (ref 30.0–36.0)
MCV: 84.7 fl (ref 78.0–100.0)
Monocytes Absolute: 0.5 10*3/uL (ref 0.1–1.0)
Monocytes Relative: 6.3 % (ref 3.0–12.0)
Neutro Abs: 5.6 10*3/uL (ref 1.4–7.7)
Neutrophils Relative %: 67 % (ref 43.0–77.0)
Platelets: 211 10*3/uL (ref 150.0–400.0)
RBC: 4.67 Mil/uL (ref 3.87–5.11)
RDW: 13.1 % (ref 11.5–15.5)
WBC: 8.3 10*3/uL (ref 4.0–10.5)

## 2021-10-06 LAB — URINALYSIS
Bilirubin Urine: NEGATIVE
Hgb urine dipstick: NEGATIVE
Ketones, ur: NEGATIVE
Leukocytes,Ua: NEGATIVE
Nitrite: NEGATIVE
Specific Gravity, Urine: 1.01 (ref 1.000–1.030)
Total Protein, Urine: NEGATIVE
Urine Glucose: NEGATIVE
Urobilinogen, UA: 0.2 (ref 0.0–1.0)
pH: 6 (ref 5.0–8.0)

## 2021-10-06 LAB — TSH: TSH: 4.35 u[IU]/mL (ref 0.35–5.50)

## 2021-10-06 LAB — HEMOGLOBIN A1C: Hgb A1c MFr Bld: 5.7 % (ref 4.6–6.5)

## 2021-10-06 NOTE — Assessment & Plan Note (Signed)
Slowly running its course without complications. Blood work and urinalysis done today.

## 2021-10-06 NOTE — Patient Instructions (Signed)
Enfermedades virales en los adultos Viral Illness, Adult Los virus son microbios diminutos que entran en el organismo de una persona y causan enfermedades. Hay muchos tipos de virus diferentes y causan muchas clases de enfermedades. Las enfermedades virales pueden ser leves o graves. Pueden afectar diferentes partes del cuerpo. Entre las afecciones a corto plazo causadas por virus, se incluyen los resfros y la gripe. Entre las afecciones a largo plazo causadas por un virus, incluyen el herpes, la culebrilla y la infeccin por VIH (virus de inmunodeficiencia humana). Se han identificado unos pocos virus asociados con determinados tipos de cncer. Cules son las causas? Muchos tipos de virus pueden causar enfermedades. Los virus invaden las clulas del organismo, se multiplican y provocan que las clulas infectadas funcionen de manera anormal o mueran. Cuando estas clulas mueren, liberan ms virus. Cuando esto ocurre, aparecen sntomas de la enfermedad, y el virus sigue diseminndose a otras clulas. Si el virus asume la funcin de la clula, puede hacer que esta se divida y prolifere de manera descontrolada. Esto ocurre cuando un virus causa cncer. Los diferentes virus ingresan al organismo de distintas formas. Puede contraer un virus de la siguiente manera: Al ingerir alimentos o beber agua que han entrado en contacto con el virus (estn contaminados). Al inhalar gotitas que una persona infectada liber en el aire al toser o estornudar. Al tocar una superficie contaminada con el virus y luego llevarse la mano a la boca, la nariz o los ojos. Al ser picado por un insecto o mordido por un animal que son portadores del virus. Al tener contacto sexual con una persona infectada por el virus. Al tener contacto con sangre o lquidos que contienen el virus, ya sea a travs de un corte abierto o durante una transfusin. Si el virus ingresa al organismo, el sistema de defensa del cuerpo (sistema inmunitario)  intentar combatirlo. Puede correr un riesgo ms alto de tener una enfermedad viral si tiene el sistema inmunitario debilitado. Cules son los signos o sntomas? Puede tener los siguientes sntomas, dependiendo del tipo de virus y de la ubicacin de las clulas que invade: Virus del resfro y de la gripe: Fiebre. Dolor de cabeza. Dolor de garganta. Dolores musculares. Nariz tapada (congestin nasal). Tos. Virus del aparato digestivo (gastrointestinales): Fiebre. Dolor en el abdomen. Nuseas. Diarrea. Virus hepticos (hepatitis): Prdida del apetito. Cansancio. La piel o las partes blancas de los ojos se ponen amarillas (ictericia). Virus del cerebro y la mdula espinal: Fiebre. Dolor de cabeza. Rigidez en el cuello. Nuseas y vmitos. Confusin o somnolencia. Virus de la piel: Verrugas. Picazn. Erupcin cutnea. Virus de transmisin sexual: Secrecin. Hinchazn. Enrojecimiento. Erupcin cutnea. Cmo se diagnostica? Esta afeccin se puede diagnosticar en funcin de lo siguiente: Sntomas. Antecedentes mdicos. Examen fsico. Anlisis de sangre, una muestra de mucosidad de los pulmones (muestra de esputo), muestra de heces o un hisopado de lquidos corporales o una llaga de la piel (lesin). Cmo se trata? Los virus pueden ser difciles de tratar porque se hospedan en las clulas. Los antibiticos no tratan los virus porque estos medicamentos no llegan al interior de las clulas. El tratamiento de una enfermedad viral puede incluir lo siguiente: Descansar y beber abundantes lquidos. Medicamentos para aliviar los sntomas. Estos pueden incluir medicamentos de venta libre para el dolor y la fiebre, medicamentos para la tos o la congestin y medicamentos para aliviar la diarrea. Medicamentos antivirales. Estos medicamentos estn disponibles nicamente para ciertos tipos de virus. Algunas enfermedades virales pueden evitarse con vacunas. Un ejemplo frecuente   es la vacuna  antigripal. Siga estas instrucciones en su casa: Medicamentos Use los medicamentos de venta libre y los recetados solamente como se lo haya indicado el mdico. Si le recetaron un medicamento antiviral, tmelo como se lo haya indicado el mdico. No deje de tomar el antiviral aunque comience a sentirse mejor. Infrmese sobre cundo los antibiticos son necesarios y cundo no. Los antibiticos no combaten a los virus. Si tiene una infeccin viral y el mdico cree que tambin tiene una infeccin bacteriana, o que est en riesgo de contraerla, tal vez le recete un antibitico. No solicite una receta para antibiticos si le han diagnosticado una enfermedad viral. Los antibiticos no harn que se cure ms rpidamente. Tomar antibiticos con frecuencia cuando no son necesarios puede derivar en resistencia a los antibiticos. Cuando esto ocurre, el medicamento pierde su eficacia contra las bacterias que normalmente combate. Indicaciones generales  Beba suficiente lquido para mantener la orina de color amarillo plido. Descanse todo lo que pueda. Retome sus actividades normales segn lo indicado por el mdico. Pregntele al mdico qu actividades son seguras para usted. Concurra a todas las visitas de seguimiento como se lo haya indicado el mdico. Esto es importante. Cmo se previene? Para reducir el riesgo de tener una enfermedad viral: Lvese las manos frecuentemente con agua y jabn durante al menos 20 segundos. Use desinfectante para manos si no dispone de agua y jabn. Evite tocarse la nariz, los ojos y la boca, sobre todo si no se lav las manos recientemente. Si un miembro de la familia tiene una infeccin viral, limpie todas las superficies de la casa que puedan haber estado en contacto con el virus. Use agua caliente y jabn. Tambin puede usar leja con agua agregada (diluido). Mantngase lejos de las personas enfermas con sntomas de una infeccin viral. No comparta objetos tales como  cepillos de dientes y botellas de agua con otras personas. Mantenga las vacunas al da. Esto incluye recibir la vacuna antigripal todos los aos. Siga una dieta saludable y descanse lo suficiente. Comunquese con un mdico si: Tiene sntomas de una enfermedad viral que no desaparecen. Los sntomas regresan despus de haber desaparecido. Sus sntomas empeoran. Solicite ayuda de inmediato si tiene: Dificultad para respirar. Dolor de cabeza intenso o rigidez en el cuello. Vmitos abundantes o dolor en el abdomen. Estos sntomas pueden representar un problema grave que constituye una emergencia. No espere a ver si los sntomas desaparecen. Solicite atencin mdica de inmediato. Comunquese con el servicio de emergencias de su localidad (911 en los [place]). No conduzca por sus propios medios hasta el hospital. Resumen Los virus son tipos de microbios que entran en el organismo de una persona y causan enfermedades. Las enfermedades virales pueden ser leves o graves. Pueden afectar diferentes partes del cuerpo. Los virus pueden ser difciles de tratar. Hay medicamentos para aliviar los sntomas y hay algunos medicamentos antivirales. Si le recetaron un medicamento antiviral, tmelo como se lo haya indicado el mdico. No deje de tomar el antiviral aunque comience a sentirse mejor. Comunquese con un mdico si tiene sntomas de una enfermedad viral que no desaparecen. Esta informacin no tiene como fin reemplazar el consejo del mdico. Asegrese de hacerle al mdico cualquier pregunta que tenga. Document Revised: 08/01/2019 Document Reviewed: 08/01/2019 Elsevier Patient Education  2023 [employer]  

## 2021-10-06 NOTE — Assessment & Plan Note (Signed)
Clinically stable.  Afebrile. Benign abdominal examination. Differential diagnosis discussed. Needs blood work and urinalysis. Slowly getting better.  No concerns.

## 2021-10-06 NOTE — Assessment & Plan Note (Addendum)
Most likely secondary to viral illness. Much better today. No red flag signs or symptoms.  No complications. Blood work and urinalysis done today. Advised to rest and stay well-hydrated. Advised to contact the office if no better or worse during the next several days

## 2021-10-06 NOTE — Progress Notes (Signed)
Jasmine Howard 46 y.o.   Chief Complaint  Patient presents with   Abdominal Pain    Over week pt has been feeling nauseas and feeling pain in lower abdomin and back. Pt also sated she pass out last Friday.   Rash    After taking B-12. Makes arms red and itchy same in legs.    HISTORY OF PRESENT ILLNESS: Acute problem visit today. This is a 46 y.o. female complaining of general weakness, fatigue, dizziness that started last week followed by syncopal episode on Friday.  Also had lower abdominal pain.  No urinary symptoms. Today however feels about 80% better. The denies fever or chills.  Able to eat and drink.  Denies nausea or vomiting.  Denies any other associated symptoms. No other complaints or medical concerns today. No one else sick at home. No new medications.  Abdominal Pain Pertinent negatives include no constipation, fever, headaches, melena, nausea or vomiting.  Rash Pertinent negatives include no congestion, cough, fever, shortness of breath, sore throat or vomiting.     Prior to Admission medications   Medication Sig Start Date End Date Taking? Authorizing Provider  hydrochlorothiazide (HYDRODIURIL) 12.5 MG tablet Take 1 tablet (12.5 mg total) by mouth daily. 01/22/21  Yes Daeton Kluth, Eilleen KempfMiguel Jose, MD  levonorgestrel (MIRENA) 20 MCG/24HR IUD 1 Intra Uterine Device (1 each total) by Intrauterine route once. 10/25/12  Yes Newt LukesLeschber, Valerie A, MD  levothyroxine (SYNTHROID) 50 MCG tablet Take 1 tablet (50 mcg total) by mouth daily before breakfast. 10/21/20  Yes Careli Luzader, Eilleen KempfMiguel Jose, MD  metFORMIN (GLUCOPHAGE-XR) 500 MG 24 hr tablet Take 1 tablet (500 mg total) by mouth 2 (two) times daily with a meal. 10/21/20  Yes Malani Lees, Ball ClubMiguel Jose, MD  OZEMPIC, 0.25 OR 0.5 MG/DOSE, 2 MG/1.5ML SOPN INJECT 0.5 MG INTO THE SKIN ONCE A WEEK. 02/26/21  Yes Tonimarie Gritz, Eilleen KempfMiguel Jose, MD  simvastatin (ZOCOR) 20 MG tablet Take 1 tablet (20 mg total) by mouth at bedtime. 04/23/21 07/22/21  Georgina QuintSagardia,  Shaconda Hajduk Jose, MD    No Known Allergies  Patient Active Problem List   Diagnosis Date Noted   Hypertension associated with diabetes (HCC) 01/22/2021   Hypothyroidism 12/10/2009   Type 2 diabetes mellitus with hyperglycemia, without long-term current use of insulin (HCC) 12/10/2009   Irritable bowel syndrome 03/31/2009   FATTY LIVER DISEASE 03/31/2009    Past Medical History:  Diagnosis Date   ANEMIA-NOS    BENIGN POSITIONAL VERTIGO    Cervical incompetence    s/p cerclage 03/01/12   DEPRESSION    DIABETES MELLITUS, TYPE II    metformin   FATTY LIVER DISEASE    HYPOTHYROIDISM    Infertility, female    IRITIS    Irritable bowel syndrome    Polycystic ovaries    Postpartum care following cesarean delivery (breech 11/16) 12/12/2010    Past Surgical History:  Procedure Laterality Date   CERVICAL CERCLAGE N/A 03/03/2012   Procedure: CERCLAGE CERVICAL;  Surgeon: Tresa EndoKelly A. Ernestina PennaFogleman, MD;  Location: [facility];  Service: Gynecology;  Laterality: N/A;  EDD:  09/07/12   CESAREAN SECTION  12/11/2010   Procedure: CESAREAN SECTION;  Surgeon: Tresa EndoKelly A. Fogleman;  Location: [facility];  Service: Gynecology;  Laterality: N/A;   CESAREAN SECTION N/A 09/01/2012   Procedure: Repeat CESAREAN SECTION;  Surgeon: Tresa EndoKelly A. Ernestina PennaFogleman, MD;  Location: [facility];  Service: Obstetrics;  Laterality: N/A;  EDD: 09/07/12   CHOLECYSTECTOMY  2009   SCAR REVISION  09/01/2012   Procedure: SCAR REVISION;  Surgeon:  Kelly A. Ernestina Penna, MD;  Location: [facility];  Service: Obstetrics;;  cesarean section scar revision    Social History   Socioeconomic History   Marital status: Married    Spouse name: Not on file   Number of children: Not on file   Years of education: Not on file   Highest education level: Not on file  Occupational History   Not on file  Tobacco Use   Smoking status: Never   Smokeless tobacco: Never   Tobacco comments:    Married, lives with spouse and 2 kids. 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   Substance and Sexual Activity   Alcohol use: No   Drug use: No   Sexual activity: Never  Other Topics Concern   Not on file  Social History Narrative   Not on file   Social Determinants of Health   Financial Resource Strain: Not on file  Food Insecurity: Not on file  Transportation Needs: Not on file  Physical Activity: Not on file  Stress: Not on file  Social Connections: Not on file  Intimate Partner Violence: Not on file    Family History  Problem Relation Age of Onset   Arthritis Other        Parent   Alcohol abuse Other        Parent     Review of Systems  Constitutional:  Positive for malaise/fatigue. Negative for chills and fever.  HENT:  Negative for congestion and sore throat.   Respiratory: Negative.  Negative for cough and shortness of breath.   Cardiovascular: Negative.  Negative for chest pain and palpitations.  Gastrointestinal:  Positive for abdominal pain. Negative for blood in stool, constipation, melena, nausea and vomiting.  Genitourinary: Negative.   Skin:  Positive for rash.  Neurological: Negative.  Negative for dizziness and headaches.  All other systems reviewed and are negative.   Today's Vitals   10/06/21 1352  BP: 110/68  Pulse: 74  Temp: 98.1 F (36.7 C)  TempSrc: Oral  SpO2: 94%  Weight: 213 lb (96.6 kg)  Height: 5\' 4"  (1.626 m)   Body mass index is 36.56 kg/m. Wt Readings from Last 3 Encounters:  10/06/21 213 lb (96.6 kg)  04/23/21 220 lb 4 oz (99.9 kg)  01/22/21 226 lb (102.5 kg)    Physical Exam Vitals reviewed.  Constitutional:      Appearance: She is well-developed. She is obese.  HENT:     Head: Normocephalic.     Right Ear: Tympanic membrane, ear canal and external ear normal.     Left Ear: Tympanic membrane, ear canal and external ear normal.     Mouth/Throat:     Mouth: Mucous membranes are moist.     Pharynx: Oropharynx is clear.  Eyes:     Extraocular Movements: Extraocular movements intact.      Conjunctiva/sclera: Conjunctivae normal.     Pupils: Pupils are equal, round, and reactive to light.  Cardiovascular:     Rate and Rhythm: Normal rate and regular rhythm.     Pulses: Normal pulses.     Heart sounds: Normal heart sounds.  Pulmonary:     Effort: Pulmonary effort is normal.     Breath sounds: Normal breath sounds.  Abdominal:     General: There is no distension.     Palpations: Abdomen is soft.     Tenderness: There is no abdominal tenderness.  Musculoskeletal:     Cervical back: No tenderness.     Right  lower leg: No edema.     Left lower leg: No edema.  Lymphadenopathy:     Cervical: No cervical adenopathy.  Skin:    General: Skin is warm and dry.     Capillary Refill: Capillary refill takes less than 2 seconds.  Neurological:     General: No focal deficit present.     Mental Status: She is alert and oriented to person, place, and time.  Psychiatric:        Mood and Affect: Mood normal.        Behavior: Behavior normal.      ASSESSMENT & PLAN: A total of 45 minutes was spent with the patient and counseling/coordination of care regarding preparing for this visit, review of most recent office visit notes, review of most recent blood work results, differential diagnosis of lower abdominal pain and need for work-up, education on nutrition, prognosis, documentation, and need for follow-up.  Problem List Items Addressed This Visit       Cardiovascular and Mediastinum   Hypertension associated with diabetes [facility])     Endocrine   Hypothyroidism     Other   Lower abdominal pain - Primary    Clinically stable.  Afebrile. Benign abdominal examination. Differential diagnosis discussed. Needs blood work and urinalysis. Slowly getting better.  No concerns.      General weakness    Most likely secondary to viral illness. Much better today. No red flag signs or symptoms.  No complications. Blood work and urinalysis done today. Advised to rest and stay  well-hydrated. Advised to contact the office if no better or worse during the next several days      Relevant Orders   Flu Vaccine QUAD 34mo+IM (Fluarix, Fluzone & Alfiuria Quad PF) (Completed)   Tdap vaccine greater than or equal to 7yo IM   Urinalysis   CBC with Differential/Platelet   Comprehensive metabolic panel   TSH   Hemoglobin A1c   Viral illness    Slowly running its course without complications. Blood work and urinalysis done today.      Relevant Orders   Flu Vaccine QUAD 79mo+IM (Fluarix, Fluzone & Alfiuria Quad PF) (Completed)   Tdap vaccine greater than or equal to 7yo IM   Urinalysis   CBC with Differential/Platelet   Comprehensive metabolic panel   TSH   Hemoglobin A1c   Other Visit Diagnoses     Need for vaccination       Relevant Orders   Flu Vaccine QUAD 3mo+IM (Fluarix, Fluzone & Alfiuria Quad PF) (Completed)   Tdap vaccine greater than or equal to 7yo IM        Patient Instructions  Enfermedades virales en los adultos Viral Illness, Adult Los virus son microbios diminutos que entran en el organismo de [place] persona y causan enfermedades. Hay muchos tipos de virus diferentes y causan muchas clases de enfermedades. Las enfermedades virales pueden ser leves o graves. Pueden afectar diferentes partes del cuerpo. Entre las afecciones a corto plazo causadas por virus, se incluyen los resfros y Emergency planning/management officer. Entre las afecciones a largo plazo causadas por un virus, incluyen el herpes, la culebrilla y la infeccin por VIH (virus de inmunodeficiencia humana). Se han identificado unos pocos virus asociados con determinados tipos de cncer. Cules son las causas? Muchos tipos de virus pueden causar enfermedades. Los virus invaden las clulas del [place], se multiplican y [employer] las clulas infectadas funcionen de manera anormal o [place]. Cuando estas clulas mueren, liberan ms virus. Cuando esto ocurre,  aparecen sntomas de la enfermedad, y el virus sigue  diseminndose a otras clulas. Si el virus asume la funcin de la clula, puede hacer que esta se divida y prolifere de Psychologist, occupational. Esto ocurre cuando un virus causa cncer. Los diferentes virus ingresan al organismo de [employer]. Puede contraer un virus de la siguiente [place]: Al ingerir alimentos o beber agua que han entrado en contacto con el virus (estn contaminados). Al inhalar gotitas que una persona infectada liber en el aire al toser o estornudar. Al tocar una superficie contaminada con el virus y [facility] mano a la boca, la nariz o los ojos. Al ser picado por un insecto o mordido por un animal que son portadores del virus. Al tener contacto sexual con Neomia Dear persona infectada por el virus. Al tener contacto con sangre o lquidos que contienen el virus, ya sea a travs de un corte abierto o durante una transfusin. Si el virus ingresa al organismo, el sistema de defensa del cuerpo (sistema inmunitario) Product/process development scientist. Puede correr un riesgo ms alto de tener una enfermedad viral si tiene el sistema inmunitario debilitado. Cules son los signos o sntomas? Puede tener los siguientes sntomas, dependiendo del tipo de virus y de la ubicacin de las clulas que invade: Virus del resfro y de la gripe: Grant Ruts. Dolor de [place]. Dolor de Advertising copywriter. Dolores musculares. Nariz tapada (congestin nasal). Tos. Virus del aparato digestivo (gastrointestinales): Grant Ruts. Dolor en el abdomen. Nuseas. Diarrea. Virus hepticos (hepatitis): Prdida del apetito. Cansancio. La piel o las partes blancas de los ojos se ponen amarillas (ictericia). Virus del cerebro y la mdula espinal: Grant Ruts. Dolor de [place]. Rigidez en el cuello. Nuseas y vmitos. Confusin o somnolencia. Virus de la piel: Verrugas. Picazn. Erupcin cutnea. Virus de transmisin sexual: Secrecin. Hinchazn. Enrojecimiento. Erupcin cutnea. Cmo se diagnostica? Esta afeccin se puede  diagnosticar en funcin de lo siguiente: Sntomas. Antecedentes mdicos. Examen fsico. Anlisis de [place], [place] de mucosidad de los pulmones (muestra de esputo), [place] de heces o un hisopado de lquidos corporales o una llaga de la piel (lesin). Cmo se trata? Los virus pueden ser difciles de tratar porque se hospedan en las clulas. Los antibiticos no tratan los virus porque estos medicamentos no llegan al interior de las clulas. El tratamiento de una enfermedad viral puede incluir lo siguiente: Lawyer y beber abundantes lquidos. Medicamentos para [employer]. Estos pueden incluir medicamentos de venta libre para Chief Technology Officer y la [place], medicamentos para la tos o la congestin y medicamentos para Actuary. Medicamentos antivirales. Estos medicamentos estn disponibles nicamente para ciertos tipos de virus. Algunas enfermedades virales pueden evitarse con vacunas. Un ejemplo frecuente es la vacuna antigripal. Siga estas instrucciones en su casa: Medicamentos Use los medicamentos de venta libre y los recetados solamente como se lo haya indicado el mdico. Si le recetaron un medicamento antiviral, tmelo como se lo haya indicado el mdico. No deje de tomar el antiviral aunque comience a sentirse mejor. Infrmese sobre cundo los antibiticos son necesarios y cundo no. Los antibiticos no combaten a los virus. Si tiene una infeccin viral y el mdico cree que tambin tiene una infeccin [place], o que est en riesgo de [place], tal vez le recete un antibitico. No solicite una receta para antibiticos si le han diagnosticado una enfermedad viral. Los antibiticos no harn que se cure ms rpidamente. Tomar antibiticos con frecuencia cuando no son necesarios puede derivar en resistencia a los antibiticos. Cuando esto ocurre, el medicamento pierde su eficacia  contra las bacterias que normalmente combate. Indicaciones generales  Beba suficiente lquido para  mantener la orina de color amarillo plido. Descanse todo lo que pueda. Retome sus actividades normales segn lo indicado por el mdico. Pregntele al mdico qu actividades son seguras para usted. Concurra a todas las visitas de [address] se lo haya indicado el mdico. Esto es importante. Cmo se previene? Para reducir el riesgo de tener una enfermedad viral: Lvese las manos frecuentemente con agua y jabn durante al menos 20 segundos. Use desinfectante para manos si no dispone de [place] y [place]. Evite tocarse la nariz, los ojos y la boca, sobre todo si no se lav las manos recientemente. Si un miembro de la familia tiene una infeccin viral, limpie todas las superficies de la casa que puedan haber estado en contacto con el virus. Use agua caliente y [place]. Tambin puede usar leja con agua agregada (diluido). Mantngase lejos de las personas enfermas con sntomas de una infeccin viral. No comparta objetos tales como cepillos de dientes y botellas de agua con Economist. Mantenga las vacunas al da. Esto incluye recibir la vacuna antigripal todos los [place]. Siga una dieta saludable y descanse lo suficiente. Comunquese con un mdico si: Tiene sntomas de una enfermedad viral que no desaparecen. Los sntomas regresan despus de haber desaparecido. Sus sntomas empeoran. Solicite ayuda de inmediato si tiene: Dificultad para Industrial/product designer. Dolor de cabeza intenso o rigidez en el cuello. Vmitos abundantes o dolor en el abdomen. Estos sntomas pueden representar un problema grave que constituye Radio broadcast assistant. No espere a ver si los sntomas desaparecen. Solicite atencin mdica de inmediato. Comunquese con el servicio de emergencias de su localidad (911 en los [place]). No conduzca por sus propios medios [facility]. Resumen Los virus son tipos de microbios que entran en el organismo de Neomia Dear persona y Doctor, general practice. Las enfermedades virales pueden ser leves o graves.  Pueden afectar diferentes partes del cuerpo. Los virus pueden ser difciles de Warehouse manager. Hay medicamentos para aliviar los sntomas y hay algunos medicamentos antivirales. Si le recetaron un medicamento antiviral, tmelo como se lo haya indicado el mdico. No deje de tomar el antiviral aunque comience a sentirse mejor. Comunquese con un mdico si tiene sntomas de una enfermedad viral que no desaparecen. Esta informacin no tiene Theme park manager el consejo del mdico. Asegrese de hacerle al mdico cualquier pregunta que tenga. Document Revised: 08/01/2019 Document Reviewed: 08/01/2019 Elsevier Patient Education  2023 Elsevier Inc.     Edwina Barth, MD [facility]

## 2021-10-23 ENCOUNTER — Other Ambulatory Visit: Payer: Self-pay | Admitting: Emergency Medicine

## 2021-10-23 DIAGNOSIS — I10 Essential (primary) hypertension: Secondary | ICD-10-CM

## 2021-10-23 DIAGNOSIS — R609 Edema, unspecified: Secondary | ICD-10-CM

## 2021-10-26 ENCOUNTER — Ambulatory Visit: Payer: 59 | Admitting: Emergency Medicine

## 2021-11-09 ENCOUNTER — Other Ambulatory Visit: Payer: Self-pay | Admitting: Emergency Medicine

## 2021-11-09 DIAGNOSIS — E039 Hypothyroidism, unspecified: Secondary | ICD-10-CM

## 2021-11-09 DIAGNOSIS — E1165 Type 2 diabetes mellitus with hyperglycemia: Secondary | ICD-10-CM

## 2022-03-17 ENCOUNTER — Encounter: Payer: Self-pay | Admitting: Emergency Medicine

## 2022-03-17 ENCOUNTER — Encounter: Payer: Self-pay | Admitting: Gastroenterology

## 2022-03-17 ENCOUNTER — Ambulatory Visit (INDEPENDENT_AMBULATORY_CARE_PROVIDER_SITE_OTHER): Payer: Commercial Managed Care - PPO | Admitting: Emergency Medicine

## 2022-03-17 VITALS — BP 112/76 | HR 64 | Temp 98.1°F | Ht 64.0 in | Wt 214.5 lb

## 2022-03-17 DIAGNOSIS — E1159 Type 2 diabetes mellitus with other circulatory complications: Secondary | ICD-10-CM | POA: Diagnosis not present

## 2022-03-17 DIAGNOSIS — M790 Rheumatism, unspecified: Secondary | ICD-10-CM

## 2022-03-17 DIAGNOSIS — M79604 Pain in right leg: Secondary | ICD-10-CM | POA: Diagnosis not present

## 2022-03-17 DIAGNOSIS — Z8739 Personal history of other diseases of the musculoskeletal system and connective tissue: Secondary | ICD-10-CM

## 2022-03-17 DIAGNOSIS — I152 Hypertension secondary to endocrine disorders: Secondary | ICD-10-CM | POA: Diagnosis not present

## 2022-03-17 DIAGNOSIS — M79605 Pain in left leg: Secondary | ICD-10-CM

## 2022-03-17 DIAGNOSIS — E039 Hypothyroidism, unspecified: Secondary | ICD-10-CM | POA: Diagnosis not present

## 2022-03-17 DIAGNOSIS — Z1211 Encounter for screening for malignant neoplasm of colon: Secondary | ICD-10-CM

## 2022-03-17 DIAGNOSIS — Z800 Family history of malignant neoplasm of digestive organs: Secondary | ICD-10-CM

## 2022-03-17 LAB — CBC WITH DIFFERENTIAL/PLATELET
Basophils Absolute: 0 10*3/uL (ref 0.0–0.1)
Basophils Relative: 0.5 % (ref 0.0–3.0)
Eosinophils Absolute: 0.2 10*3/uL (ref 0.0–0.7)
Eosinophils Relative: 2.7 % (ref 0.0–5.0)
HCT: 39.5 % (ref 36.0–46.0)
Hemoglobin: 13.5 g/dL (ref 12.0–15.0)
Lymphocytes Relative: 28.7 % (ref 12.0–46.0)
Lymphs Abs: 1.8 10*3/uL (ref 0.7–4.0)
MCHC: 34.1 g/dL (ref 30.0–36.0)
MCV: 85.5 fl (ref 78.0–100.0)
Monocytes Absolute: 0.5 10*3/uL (ref 0.1–1.0)
Monocytes Relative: 8.2 % (ref 3.0–12.0)
Neutro Abs: 3.7 10*3/uL (ref 1.4–7.7)
Neutrophils Relative %: 59.9 % (ref 43.0–77.0)
Platelets: 204 10*3/uL (ref 150.0–400.0)
RBC: 4.62 Mil/uL (ref 3.87–5.11)
RDW: 13.7 % (ref 11.5–15.5)
WBC: 6.2 10*3/uL (ref 4.0–10.5)

## 2022-03-17 LAB — COMPREHENSIVE METABOLIC PANEL
ALT: 44 U/L — ABNORMAL HIGH (ref 0–35)
AST: 28 U/L (ref 0–37)
Albumin: 4.4 g/dL (ref 3.5–5.2)
Alkaline Phosphatase: 60 U/L (ref 39–117)
BUN: 12 mg/dL (ref 6–23)
CO2: 26 mEq/L (ref 19–32)
Calcium: 9.8 mg/dL (ref 8.4–10.5)
Chloride: 103 mEq/L (ref 96–112)
Creatinine, Ser: 0.72 mg/dL (ref 0.40–1.20)
GFR: 99.34 mL/min (ref >60.00)
Glucose, Bld: 98 mg/dL (ref 70–99)
Potassium: 4.4 mEq/L (ref 3.5–5.1)
Sodium: 141 mEq/L (ref 135–145)
Total Bilirubin: 0.4 mg/dL (ref 0.2–1.2)
Total Protein: 7.8 g/dL (ref 6.0–8.3)

## 2022-03-17 LAB — LIPID PANEL
Cholesterol: 123 mg/dL (ref 0–200)
HDL: 43 mg/dL (ref >39.00)
LDL Cholesterol: 48 mg/dL (ref 0–99)
NonHDL: 79.87
Total CHOL/HDL Ratio: 3
Triglycerides: 159 mg/dL — ABNORMAL HIGH (ref 0.0–149.0)
VLDL: 31.8 mg/dL (ref 0.0–40.0)

## 2022-03-17 LAB — SEDIMENTATION RATE: Sed Rate: 25 mm/hr — ABNORMAL HIGH (ref 0–20)

## 2022-03-17 LAB — TSH: TSH: 5.44 u[IU]/mL (ref 0.35–5.50)

## 2022-03-17 LAB — HEMOGLOBIN A1C: Hgb A1c MFr Bld: 5.7 % (ref 4.6–6.5)

## 2022-03-17 MED ORDER — SIMVASTATIN 20 MG PO TABS: 20.0000 mg | ORAL_TABLET | Freq: Every day | ORAL | 3 refills | Status: DC

## 2022-03-17 NOTE — Assessment & Plan Note (Signed)
Unremarkable exam.  Neurovascularly intact. Most likely related to activities of daily living and osteoarthritis Chronic inflammation contributing Pain management discussed.

## 2022-03-17 NOTE — Progress Notes (Signed)
Jasmine Howard 47 y.o.   Chief Complaint  Patient presents with   Follow-up    F/u appt, pain in her legs, difficult to walk at times, digestive issues, nausea, headaches     HISTORY OF PRESENT ILLNESS: This is a 46 y.o. female complaining of bilateral leg pains for months.  Has history of osteoarthritis Also has occasional digestive issues with nausea and abdominal cramping History of diabetes on Ozempic and metformin No other associated symptoms No other complaint or medical concerns today. Lab Results  Component Value Date   HGBA1C 5.7 10/06/2021   Wt Readings from Last 3 Encounters:  03/17/22 214 lb 8 oz (97.3 kg)  10/06/21 213 lb (96.6 kg)  04/23/21 220 lb 4 oz (99.9 kg)     HPI   Prior to Admission medications   Medication Sig Start Date End Date Taking? Authorizing Provider  hydrochlorothiazide (HYDRODIURIL) 12.5 MG tablet TAKE 1 TABLET BY MOUTH EVERY DAY 10/23/21  Yes Lyon Dumont, Ines Bloomer, MD  levonorgestrel (MIRENA) 20 MCG/24HR IUD 1 Intra Uterine Device (1 each total) by Intrauterine route once. 10/25/12  Yes Rowe Clack, MD  levothyroxine (SYNTHROID) 50 MCG tablet TAKE 1 TABLET BY MOUTH DAILY BEFORE BREAKFAST 11/09/21  Yes Treyshawn Muldrew, Ines Bloomer, MD  metFORMIN (GLUCOPHAGE-XR) 500 MG 24 hr tablet TAKE 1 TABLET BY MOUTH 2 TIMES DAILY WITH A MEAL. 11/09/21  Yes Almyra Birman, Ines Bloomer, MD  OZEMPIC, 0.25 OR 0.5 MG/DOSE, 2 MG/1.5ML SOPN INJECT 0.5 MG INTO THE SKIN ONCE A WEEK. 02/26/21  Yes Blu Mcglaun, Ines Bloomer, MD  simvastatin (ZOCOR) 20 MG tablet Take 1 tablet (20 mg total) by mouth at bedtime. 04/23/21 07/22/21  Horald Pollen, MD    No Known Allergies  Patient Active Problem List   Diagnosis Date Noted   General weakness 10/06/2021   Viral illness 10/06/2021   Hypertension associated with diabetes ([place]) 01/22/2021   Lower abdominal pain 01/10/2015   Hypothyroidism 12/10/2009   Type 2 diabetes mellitus with hyperglycemia, without long-term current  use of insulin ([place]) 12/10/2009   Irritable bowel syndrome 03/31/2009   FATTY LIVER DISEASE 03/31/2009    Past Medical History:  Diagnosis Date   ANEMIA-NOS    BENIGN POSITIONAL VERTIGO    Cervical incompetence    s/p cerclage 03/01/12   DEPRESSION    DIABETES MELLITUS, TYPE II    metformin   FATTY LIVER DISEASE    HYPOTHYROIDISM    Infertility, female    IRITIS    Irritable bowel syndrome    Polycystic ovaries    Postpartum care following cesarean delivery (breech 11/16) 12/12/2010    Past Surgical History:  Procedure Laterality Date   CERVICAL CERCLAGE N/A 03/03/2012   Procedure: CERCLAGE CERVICAL;  Surgeon: Claiborne Billings A. Pamala Hurry, MD;  Location: [facility];  Service: Gynecology;  Laterality: N/A;  EDD:  09/07/12   CESAREAN SECTION  12/11/2010   Procedure: CESAREAN SECTION;  Surgeon: Claiborne Billings A. Fogleman;  Location: [facility];  Service: Gynecology;  Laterality: N/A;   CESAREAN SECTION N/A 09/01/2012   Procedure: Repeat CESAREAN SECTION;  Surgeon: Claiborne Billings A. Pamala Hurry, MD;  Location: [facility];  Service: Obstetrics;  Laterality: N/A;  EDD: 09/07/12   CHOLECYSTECTOMY  2009   SCAR REVISION  09/01/2012   Procedure: SCAR REVISION;  Surgeon: Claiborne Billings A. Pamala Hurry, MD;  Location: [facility];  Service: Obstetrics;;  cesarean section scar revision    Social History   Socioeconomic History   Marital status: Married    Spouse name: Not on file  Number of children: Not on file   Years of education: Not on file   Highest education level: Not on file  Occupational History   Not on file  Tobacco Use   Smoking status: Never   Smokeless tobacco: Never   Tobacco comments:    Married, lives with spouse and 2 kids. 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  Negative for chills and fever.  HENT: Negative.  Negative for congestion and sore throat.   Respiratory: Negative.  Negative for cough and shortness of breath.   Cardiovascular: Negative.  Negative for chest pain and palpitations.  Gastrointestinal:  Positive for nausea. Negative for abdominal pain, diarrhea and vomiting.  Musculoskeletal:  Positive for joint pain.  Skin: Negative.  Negative for rash.  Neurological:  Positive for headaches. Negative for dizziness.  All other systems reviewed and are negative.   Today's Vitals   03/17/22 0913  BP: 112/76  Pulse: 64  Temp: 98.1 F (36.7 C)  TempSrc: Oral  SpO2: 94%  Weight: 214 lb 8 oz (97.3 kg)  Height: 5' 4"$  (1.626 m)   Body mass index is 36.82 kg/m.  Physical Exam Vitals reviewed.  Constitutional:      Appearance: Normal appearance.  HENT:     Head: Normocephalic.  Eyes:     Extraocular Movements: Extraocular movements intact.     Conjunctiva/sclera: Conjunctivae normal.     Pupils: Pupils are equal, round, and reactive to light.  Cardiovascular:     Rate and Rhythm: Normal rate and regular rhythm.     Pulses: Normal pulses.     Heart sounds: Normal heart sounds.  Pulmonary:     Effort: Pulmonary effort is normal.     Breath sounds: Normal breath sounds.  Abdominal:     Palpations: Abdomen is soft.     Tenderness: There is no abdominal tenderness.  Musculoskeletal:     Cervical back: No tenderness.  Lymphadenopathy:     Cervical: No cervical adenopathy.  Skin:    General: Skin is warm and dry.  Neurological:     General: No focal deficit present.      Mental Status: She is alert and oriented to person, place, and time.  Psychiatric:        Mood and Affect: Mood normal.        Behavior: Behavior normal.      ASSESSMENT & PLAN: A total of 43 minutes was spent with the patient and counseling/coordination of care regarding preparing for this visit, review of most recent office visit note, review of multiple chronic medical conditions and their treatment, review of all medications, cardiovascular risks associated with hypertension and diabetes, diagnosis of arthritis and chronic pain management, review of  health maintenance items, education on nutrition, need for colon cancer screening with colonoscopy and possible upper endoscopy given her family history of stomach cancer, prognosis, documentation, and need for follow-up  Problem List Items Addressed This Visit       Cardiovascular and Mediastinum   Hypertension associated with diabetes ([place]) - Primary    Well-controlled hypertension. Continue hydrochlorothiazide 12.5 mg daily. Well-controlled diabetes. However having medication side effects, mostly GI symptoms. Continue Ozempic 0.5 mg weekly and metformin 500 mg twice a day. Cardiovascular risk associated with hypertension and diabetes discussed Continue simvastatin 20 mg daily. The 10-year ASCVD risk score (Arnett DK, et al., 2019) is: 1.7%   Values used to calculate the score:     Age: 35 years     Sex: Female     Is Non-Hispanic African American: No     Diabetic: Yes     Tobacco smoker: No     Systolic Blood Pressure: XX123456 mmHg     Is BP treated: Yes     HDL Cholesterol: 41.9 mg/dL     Total Cholesterol: 146 mg/dL  Diet and nutrition discussed. Follow-up in 6 months.      Relevant Medications   simvastatin (ZOCOR) 20 MG tablet   Other Relevant Orders   Comprehensive metabolic panel   CBC with Differential/Platelet   Lipid panel   Hemoglobin A1c     Endocrine   Hypothyroidism    Clinically euthyroid. TSH done  today Continue Synthroid 50 mcg daily.      Relevant Orders   TSH     Musculoskeletal and Integument   Rheumatism    History of arthritis. Pain in multiple joints and all extremities Over-the-counter analgesics help at times Blood work done today Chronic inflammation contributing Diet and nutrition discussed May need rheumatology evaluation.      Relevant Orders   ANA,IFA RA Diag Pnl w/rflx Tit/Patn   Sedimentation rate     Other   History of osteoarthritis   Bilateral leg pain    Unremarkable exam.  Neurovascularly intact. Most likely related to activities of daily living and osteoarthritis Chronic inflammation contributing Pain management discussed.      Relevant Orders   ANA,IFA RA Diag Pnl w/rflx Tit/Patn   Sedimentation rate   Other Visit Diagnoses     Colon cancer screening       Relevant Orders   Ambulatory referral to Gastroenterology   Family history of stomach cancer       Relevant Orders   Ambulatory referral to Gastroenterology      Patient Instructions  Mantenimiento de la salud en [place] Maintenance, Female Adoptar un estilo de vida saludable y recibir atencin preventiva son importantes para promover la salud y Musician. Consulte al mdico sobre: El esquema adecuado para hacerse pruebas y exmenes peridicos. Cosas que puede hacer por su cuenta para prevenir enfermedades y [place] sano. Qu debo saber sobre la dieta, el peso y el ejercicio? Consuma una dieta saludable  Consuma una dieta que incluya muchas verduras, frutas, productos lcteos con bajo contenido de [place] y Advertising account planner. No consuma muchos alimentos ricos en grasas slidas, azcares agregados o sodio. Mantenga un peso saludable El ndice de masa muscular [facility]) se [place] para identificar problemas de [place]. Proporciona una estimacin de la grasa corporal basndose en el peso y la altura. Su mdico puede ayudarle a Radiation protection practitioner [place] y a Scientist, forensic o Theatre manager un  peso saludable. Haga ejercicio con regularidad Haga ejercicio con regularidad. Esta es  una de las prcticas ms importantes que puede hacer por su salud. La [employer] de los adultos deben seguir estas pautas: Optometrist, al menos, 150 minutos de actividad fsica por semana. El ejercicio debe aumentar la frecuencia cardaca y Nature conservation officer transpirar (ejercicio de intensidad moderada). Hacer ejercicios de fortalecimiento por lo [employer] por semana. Agregue esto a su plan de ejercicio de intensidad moderada. Pase menos tiempo sentada. Incluso la actividad fsica ligera puede ser beneficiosa. Controle sus niveles de colesterol y lpidos en la sangre Comience a realizarse anlisis de lpidos y Research officer, trade union en la sangre a los 44 aos y luego reptalos cada 5 aos. Hgase controlar los niveles de colesterol con mayor frecuencia si: Sus niveles de lpidos y colesterol son altos. Es mayor de 55 aos. Presenta un alto riesgo de padecer enfermedades cardacas. Qu debo saber sobre las pruebas de deteccin del cncer? Segn su historia clnica y sus antecedentes familiares, es posible que deba realizarse pruebas de deteccin del cncer en diferentes edades. Esto puede incluir pruebas de deteccin de lo siguiente: Cncer de mama. Cncer de cuello uterino. Cncer colorrectal. Cncer de piel. Cncer de pulmn. Qu debo saber sobre la enfermedad cardaca, la diabetes y la hipertensin arterial? Presin arterial y enfermedad cardaca La hipertensin arterial causa enfermedades cardacas y [place] el riesgo de accidente cerebrovascular. Es ms probable que esto se manifieste en las personas que tienen lecturas de presin arterial alta o tienen sobrepeso. Hgase controlar la presin arterial: Cada 3 a 5 aos si tiene entre 18 y 68 aos. Todos los aos si es mayor de 40 aos. Diabetes Realcese exmenes de deteccin de la diabetes con regularidad. Este anlisis revisa el nivel de azcar en la sangre en [place].  Hgase las pruebas de deteccin: Cada tres aos despus de los 9 aos de edad si tiene un peso normal y un bajo riesgo de padecer diabetes. Con ms frecuencia y a partir de [place] edad inferior si tiene sobrepeso o un alto riesgo de padecer diabetes. Qu debo saber sobre la prevencin de infecciones? Hepatitis B Si tiene un riesgo ms alto de contraer hepatitis B, debe someterse a un examen de deteccin de este virus. Hable con el mdico para averiguar si tiene riesgo de contraer la infeccin por hepatitis B. Hepatitis C Se recomienda el anlisis a: [employer] 1945 y 1965. Todas las personas que tengan un riesgo de haber contrado hepatitis C. Enfermedades de transmisin sexual (ETS) Hgase las pruebas de Programme researcher, broadcasting/film/video de ITS, incluidas la gonorrea y la clamidia, si: Es sexualmente activa y es menor de 52 aos. Es mayor de 16 aos, y Investment banker, operational informa que corre riesgo de tener este tipo de infecciones. La actividad sexual ha cambiado desde que le hicieron la ltima prueba de deteccin y tiene un riesgo mayor de Best boy clamidia o Radio broadcast assistant. Pregntele al mdico si usted tiene riesgo. Pregntele al mdico si usted tiene un alto riesgo de Museum/gallery curator VIH. El mdico tambin puede recomendarle un medicamento recetado para ayudar a evitar la infeccin por el VIH. Si elige tomar medicamentos para prevenir el VIH, primero debe [employer] de deteccin del VIH. Luego debe hacerse anlisis cada 3 meses mientras est tomando los medicamentos. Embarazo Si est por dejar de Librarian, academic (fase premenopusica) y usted puede quedar [place], busque asesoramiento antes de [place]. Tome de 400 a 800 microgramos (mcg) de cido [employer] si [place]. Pida mtodos de control de la natalidad (anticonceptivos) si desea Environmental health practitioner  no deseado. Osteoporosis y [place] La osteoporosis es una enfermedad en la que los huesos pierden los minerales y la fuerza por el  avance de la edad. El resultado pueden ser fracturas en los [place]. Si tiene 79 aos o ms, o si est en riesgo de sufrir osteoporosis y fracturas, pregunte a su mdico si debe: Hacerse pruebas de deteccin de prdida sea. Tomar un suplemento de calcio o de vitamina D para reducir el riesgo de fracturas. Recibir terapia de reemplazo hormonal (TRH) para tratar los sntomas de la menopausia. Siga estas indicaciones en su casa: Consumo de alcohol No beba alcohol si: Su mdico le indica no hacerlo. Est embarazada, puede estar embarazada o est tratando de [place]. Si bebe alcohol: Limite la cantidad que bebe a lo siguiente: De 0 a 1 bebida por da. Sepa cunta cantidad de alcohol hay en las bebidas que toma. En los [place], una medida equivale a una botella de cerveza de 12 oz (355 ml), un vaso de vino de 5 oz (148 ml) o un vaso de una bebida alcohlica de alta graduacin de 1 oz (44 ml). Estilo de vida No consuma ningn producto que contenga nicotina o tabaco. Estos productos incluyen cigarrillos, tabaco para Higher education careers adviser y aparatos de vapeo, como los Psychologist, sport and exercise. Si necesita ayuda para dejar de consumir estos productos, consulte al mdico. No consuma drogas. No comparta agujas. Solicite ayuda a su mdico si necesita apoyo o informacin para abandonar las drogas. Indicaciones generales Realcese los estudios de rutina de la salud, dentales y de Public librarian. Nueces. Infrmele a su mdico si: Se siente deprimida con frecuencia. Alguna vez ha sido vctima de [place] o no se siente seguro en su casa. Resumen Adoptar un estilo de vida saludable y recibir atencin preventiva son importantes para promover la salud y Musician. Siga las instrucciones del mdico acerca de una dieta saludable, el ejercicio y la realizacin de pruebas o exmenes para Engineer, building services. Siga las instrucciones del mdico con respecto al control del colesterol y la  presin arterial. Esta informacin no tiene Marine scientist el consejo del mdico. Asegrese de hacerle al mdico cualquier pregunta que tenga. Document Revised: 06/19/2020 Document Reviewed: 06/19/2020 Elsevier Patient Education  [place], MD [facility]

## 2022-03-17 NOTE — Patient Instructions (Signed)

## 2022-03-17 NOTE — Assessment & Plan Note (Addendum)
History of arthritis. Pain in multiple joints and all extremities Over-the-counter analgesics help at times Blood work done today Chronic inflammation contributing Diet and nutrition discussed May need rheumatology evaluation.

## 2022-03-17 NOTE — Assessment & Plan Note (Addendum)
Well-controlled hypertension. Continue hydrochlorothiazide 12.5 mg daily. Well-controlled diabetes. However having medication side effects, mostly GI symptoms. Continue Ozempic 0.5 mg weekly and metformin 500 mg twice a day. Cardiovascular risk associated with hypertension and diabetes discussed Continue simvastatin 20 mg daily. The 10-year ASCVD risk score (Arnett DK, et al., 2019) is: 1.7%   Values used to calculate the score:     Age: 47 years     Sex: Female     Is Non-Hispanic African American: No     Diabetic: Yes     Tobacco smoker: No     Systolic Blood Pressure: [id number] mmHg     Is BP treated: Yes     HDL Cholesterol: 41.9 mg/dL     Total Cholesterol: 146 mg/dL  Diet and nutrition discussed. Follow-up in 6 months.

## 2022-03-17 NOTE — Assessment & Plan Note (Signed)
Clinically euthyroid. TSH done today Continue Synthroid 50 mcg daily.

## 2022-03-19 LAB — ANA,IFA RA DIAG PNL W/RFLX TIT/PATN
Anti Nuclear Antibody (ANA): NEGATIVE
Cyclic Citrullin Peptide Ab: <16 UNITS
Rheumatoid fact SerPl-aCnc: <14 IU/mL (ref <14)

## 2022-03-25 ENCOUNTER — Other Ambulatory Visit: Payer: Self-pay | Admitting: Emergency Medicine

## 2022-03-25 DIAGNOSIS — E1165 Type 2 diabetes mellitus with hyperglycemia: Secondary | ICD-10-CM

## 2022-04-13 ENCOUNTER — Telehealth: Payer: Self-pay | Admitting: *Deleted

## 2022-04-13 NOTE — Telephone Encounter (Signed)
PT. CALLED RESCHEDULED FOR 04/15/22

## 2022-04-13 NOTE — Telephone Encounter (Signed)
Attempted mobile # x 2 and home # message left with call back # to return call to reschedule pre-visit or upcoming procedure on 05/11/22 will be canceled.

## 2022-04-15 ENCOUNTER — Ambulatory Visit (AMBULATORY_SURGERY_CENTER): Payer: Commercial Managed Care - PPO | Admitting: *Deleted

## 2022-04-15 VITALS — Ht 62.0 in | Wt 212.0 lb

## 2022-04-15 DIAGNOSIS — Z800 Family history of malignant neoplasm of digestive organs: Secondary | ICD-10-CM

## 2022-04-15 DIAGNOSIS — Z1211 Encounter for screening for malignant neoplasm of colon: Secondary | ICD-10-CM

## 2022-04-15 MED ORDER — NA SULFATE-K SULFATE-MG SULF 17.5-3.13-1.6 GM/177ML PO SOLN: 1.0000 | Freq: Once | ORAL | 0 refills | Status: AC

## 2022-04-15 NOTE — Progress Notes (Signed)
No egg or soy allergy known to patient  No issues known to pt with past sedation with any surgeries or procedures Patient denies ever being told they had issues or difficulty with intubation  No FH of Malignant Hyperthermia Pt is not on diet pills Pt is not on  home 02  Pt is not on blood thinners  Pt denies issues with constipation  No A fib or A flutter Have any cardiac testing pending--no Pt instructed to use Singlecare.com or GoodRx for a price reduction on prep    Pt.stated "blood elevates when have medical things done".  Patient's chart reviewed by Osvaldo Angst CNRA prior to previsit and patient appropriate for the [facility].  Previsit completed and red dot placed by patient's name on their procedure day (on provider's schedule).

## 2022-05-11 ENCOUNTER — Ambulatory Visit (AMBULATORY_SURGERY_CENTER): Payer: Commercial Managed Care - PPO | Admitting: Gastroenterology

## 2022-05-11 ENCOUNTER — Encounter: Payer: Self-pay | Admitting: Gastroenterology

## 2022-05-11 VITALS — BP 104/57 | HR 56 | Temp 98.9°F | Resp 13 | Ht 62.0 in | Wt 212.0 lb

## 2022-05-11 DIAGNOSIS — Z1211 Encounter for screening for malignant neoplasm of colon: Secondary | ICD-10-CM

## 2022-05-11 DIAGNOSIS — K635 Polyp of colon: Secondary | ICD-10-CM | POA: Diagnosis not present

## 2022-05-11 DIAGNOSIS — D122 Benign neoplasm of ascending colon: Secondary | ICD-10-CM

## 2022-05-11 MED ORDER — SODIUM CHLORIDE 0.9 % IV SOLN: 500.0000 mL | Freq: Once | INTRAVENOUS | Status: DC

## 2022-05-11 NOTE — Progress Notes (Signed)
Called to room to assist during endoscopic procedure.  Patient ID and intended procedure confirmed with present staff. Received instructions for my participation in the procedure from the performing physician.  

## 2022-05-11 NOTE — Patient Instructions (Addendum)
Resume previous diet Continue present medications Await pathology results  Handout/information given for polyps  YOU HAD AN ENDOSCOPIC PROCEDURE TODAY AT THE [facility]:   Refer to the procedure report that was given to you for any specific questions about what was found during the examination.  If the procedure report does not answer your questions, please call your gastroenterologist to clarify.  If you requested that your care partner not be given the details of your procedure findings, then the procedure report has been included in a sealed envelope for you to review at your convenience later.  YOU SHOULD EXPECT: Some feelings of bloating in the abdomen. Passage of more gas than usual.  Walking can help get rid of the air that was put into your GI tract during the procedure and reduce the bloating. If you had a lower endoscopy (such as a colonoscopy or flexible sigmoidoscopy) you may notice spotting of blood in your stool or on the toilet paper. If you underwent a bowel prep for your procedure, you may not have a normal bowel movement for a few days.  Please Note:  You might notice some irritation and congestion in your nose or some drainage.  This is from the oxygen used during your procedure.  There is no need for concern and it should clear up in a day or so.  SYMPTOMS TO REPORT IMMEDIATELY:  Following lower endoscopy (colonoscopy):  Excessive amounts of blood in the stool  Significant tenderness or worsening of abdominal pains  Swelling of the abdomen that is new, acute  Fever of 100F or higher  For urgent or emergent issues, a gastroenterologist can be reached at any hour by calling [phone]. Do not use MyChart messaging for urgent concerns.    DIET:  We do recommend a small meal at first, but then you may proceed to your regular diet.  Drink plenty of fluids but you should avoid alcoholic beverages for 24 hours.  ACTIVITY:  You should plan to take it easy for  the rest of today and you should NOT DRIVE or use heavy machinery until tomorrow (because of the sedation medicines used during the test).    FOLLOW UP: Our staff will call the number listed on your records the next business day following your procedure.  We will call around 7:15- 8:00 am to check on you and address any questions or concerns that you may have regarding the information given to you following your procedure. If we do not reach you, we will leave a message.     If any biopsies were taken you will be contacted by phone or by letter within the next 1-3 weeks.  Please call us at [phone] if you have not heard about the biopsies in 3 weeks.   SIGNATURES/CONFIDENTIALITY: You and/or your care partner have signed paperwork which will be entered into your electronic medical record.  These signatures attest to the fact that that the information above on your After Visit Summary has been reviewed and is understood.  Full responsibility of the confidentiality of this discharge information lies with you and/or your care-partner.

## 2022-05-11 NOTE — Progress Notes (Signed)
A and O x3. Report to RN. Tolerated MAC anesthesia well. 

## 2022-05-11 NOTE — Progress Notes (Signed)
[facility] History and Physical   Primary Care Physician:  Georgina Quint, MD   Reason for Procedure:   Colon cancer screening  Plan:    Screening colonoscopy     HPI: Jasmine Howard is a 47 y.o. female undergoing initial average risk screening colonoscopy.  She has no family history of colon cancer and no chronic GI symptoms.    Past Medical History:  Diagnosis Date   Allergy    seasonal   ANEMIA-NOS    Arthritis    left arm   BENIGN POSITIONAL VERTIGO    Cervical incompetence    s/p cerclage 03/01/12   DEPRESSION    DIABETES MELLITUS, TYPE II    metformin   FATTY LIVER DISEASE    Hypertension    HYPOTHYROIDISM    Infertility, female    IRITIS    Irritable bowel syndrome    Polycystic ovaries    Postpartum care following cesarean delivery (breech 11/16) 12/12/2010    Past Surgical History:  Procedure Laterality Date   CERVICAL CERCLAGE N/A 03/03/2012   Procedure: CERCLAGE CERVICAL;  Surgeon: Tresa Endo A. Ernestina Penna, MD;  Location: [facility];  Service: Gynecology;  Laterality: N/A;  EDD:  09/07/12   CESAREAN SECTION  12/11/2010   Procedure: CESAREAN SECTION;  Surgeon: Tresa Endo A. Fogleman;  Location: [facility];  Service: Gynecology;  Laterality: N/A;   CESAREAN SECTION N/A 09/01/2012   Procedure: Repeat CESAREAN SECTION;  Surgeon: Tresa Endo A. Ernestina Penna, MD;  Location: [facility];  Service: Obstetrics;  Laterality: N/A;  EDD: 09/07/12   CHOLECYSTECTOMY  2009   SCAR REVISION  09/01/2012   Procedure: SCAR REVISION;  Surgeon: Tresa Endo A. Ernestina Penna, MD;  Location: [facility];  Service: Obstetrics;;  cesarean section scar revision    Prior to Admission medications   Medication Sig Start Date End Date Taking? Authorizing Provider  Acetaminophen (TYLENOL PO) Take by mouth as needed.    [provider]  hydrochlorothiazide (HYDRODIURIL) 12.5 MG tablet TAKE 1 TABLET BY MOUTH EVERY DAY 10/23/21   Georgina Quint, MD  IBUPROFEN PO Take by mouth as needed.    [provider]  levonorgestrel (MIRENA) 20 MCG/24HR IUD 1 Intra Uterine Device (1 each total) by Intrauterine route once. 10/25/12   Newt Lukes, MD  levothyroxine (SYNTHROID) 50 MCG tablet TAKE 1 TABLET BY MOUTH DAILY BEFORE BREAKFAST 11/09/21   Georgina Quint, MD  metFORMIN (GLUCOPHAGE-XR) 500 MG 24 hr tablet TAKE 1 TABLET BY MOUTH 2 TIMES DAILY WITH A MEAL. 11/09/21   Georgina Quint, MD  Semaglutide,0.25 or 0.5MG /DOS, (OZEMPIC, 0.25 OR 0.5 MG/DOSE,) 2 MG/3ML SOPN INJECT 0.5 MG INTO THE SKIN ONCE A WEEK. 03/25/22   Georgina Quint, MD  simvastatin (ZOCOR) 20 MG tablet Take 1 tablet (20 mg total) by mouth at bedtime. 03/17/22 03/12/23  Georgina Quint, MD    Current Outpatient Medications  Medication Sig Dispense Refill   Acetaminophen (TYLENOL PO) Take by mouth as needed.     hydrochlorothiazide (HYDRODIURIL) 12.5 MG tablet TAKE 1 TABLET BY MOUTH EVERY DAY 90 tablet 3   IBUPROFEN PO Take by mouth as needed.     levonorgestrel (MIRENA) 20 MCG/24HR IUD 1 Intra Uterine Device (1 each total) by Intrauterine route once. 1 each 0   levothyroxine (SYNTHROID) 50 MCG tablet TAKE 1 TABLET BY MOUTH DAILY BEFORE BREAKFAST 90 tablet 3   metFORMIN (GLUCOPHAGE-XR) 500 MG 24 hr tablet TAKE 1 TABLET BY MOUTH 2 TIMES DAILY WITH A MEAL. 180  tablet 3   Semaglutide,0.25 or 0.5MG /DOS, (OZEMPIC, 0.25 OR 0.5 MG/DOSE,) 2 MG/3ML SOPN INJECT 0.5 MG INTO THE SKIN ONCE A WEEK. 3 mL 10   simvastatin (ZOCOR) 20 MG tablet Take 1 tablet (20 mg total) by mouth at bedtime. 90 tablet 3   No current facility-administered medications for this visit.    Allergies as of 05/11/2022   (No Known Allergies)    Family History  Problem Relation Age of Onset   Stomach cancer Maternal Aunt    Colon cancer Maternal Grandmother    Colon cancer Maternal Grandfather    Arthritis Other        Parent   Alcohol abuse Other        Parent   Colon polyps Neg Hx    Crohn's disease Neg Hx    Esophageal  cancer Neg Hx    Rectal cancer Neg Hx    Ulcerative colitis Neg Hx     Social History   Socioeconomic History   Marital status: Married    Spouse name: Not on file   Number of children: Not on file   Years of education: Not on file   Highest education level: Not on file  Occupational History   Not on file  Tobacco Use   Smoking status: Never   Smokeless tobacco: Never   Tobacco comments:    Married, lives with spouse and 2 kids. 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  Physical Exam: Vital signs BP 121/61   Pulse 67   Temp 98.9 F (37.2 C) (Skin)   Ht  (1.575 m)   Wt 212 lb (96.2 kg)   SpO2 97%   BMI 38.78 kg/m   General:   Alert,  Well-developed, well-nourished, pleasant and cooperative in NAD Airway:  Mallampati 2 Lungs:  Clear throughout to auscultation.   Heart:  Regular rate and rhythm; no murmurs, clicks, rubs,  or gallops. Abdomen:  Soft, nontender and nondistended. Normal bowel sounds.   Neuro/Psych:  Normal mood and affect. A and O x 3   Khambrel Amsden E. Tomasa Rand, MD [facility]

## 2022-05-11 NOTE — Op Note (Addendum)
[facility] Patient Name: Jasmine Howard Procedure Date: 05/11/2022 8:51 AM MRN: [id number] Endoscopist: Lorin Picket E. Tomasa Rand , MD, [id number] Age: 47 Referring MD:  Date of Birth: 11/10/1975 Gender: Female Account #: [id number] Procedure:                Colonoscopy Indications:              Screening for colorectal malignant neoplasm, This                            is the patient's first colonoscopy Medicines:                Monitored Anesthesia Care Procedure:                Pre-Anesthesia Assessment:                           - Prior to the procedure, a History and Physical                            was performed, and patient medications and                            allergies were reviewed. The patient's tolerance of                            previous anesthesia was also reviewed. The risks                            and benefits of the procedure and the sedation                            options and risks were discussed with the patient.                            All questions were answered, and informed consent                            was obtained. Prior Anticoagulants: The patient has                            taken no anticoagulant or antiplatelet agents. ASA                            Grade Assessment: III - A patient with severe                            systemic disease. After reviewing the risks and                            benefits, the patient was deemed in satisfactory                            condition to undergo the procedure.  After obtaining informed consent, the colonoscope                            was passed under direct vision. Throughout the                            procedure, the patient's blood pressure, pulse, and                            oxygen saturations were monitored continuously. The                            CF [id number] #[id number] was introduced through the anus                            and advanced  to the the terminal ileum, with                            identification of the appendiceal orifice and IC                            valve. The colonoscopy was performed without                            difficulty. The patient tolerated the procedure                            well. The quality of the bowel preparation was                            good. The terminal ileum, ileocecal valve,                            appendiceal orifice, and rectum were photographed.                            The bowel preparation used was SUPREP via split                            dose instruction. Scope In: 9:00:05 AM Scope Out: 9:15:41 AM Scope Withdrawal Time: 0 hours 11 minutes 27 seconds  Total Procedure Duration: 0 hours 15 minutes 36 seconds  Findings:                 Skin tags were found on perianal exam.                           The digital rectal exam was normal. Pertinent                            negatives include normal sphincter tone and no                            palpable rectal lesions.  A 2 mm polyp was found in the ascending colon. The                            polyp was sessile. The polyp was removed with a                            cold snare. Resection and retrieval were complete.                            Estimated blood loss was minimal.                           The exam was otherwise normal throughout the                            examined colon.                           The terminal ileum appeared normal.                           The retroflexed view of the distal rectum and anal                            verge was normal and showed no anal or rectal                            abnormalities. Complications:            No immediate complications. Estimated Blood Loss:     Estimated blood loss was minimal. Impression:               - Perianal skin tags found on perianal exam.                           - One 2 mm polyp in the ascending  colon, removed                            with a cold snare. Resected and retrieved.                           - The examined portion of the ileum was normal.                           - The distal rectum and anal verge are normal on                            retroflexion view.                           - The GI Genius (intelligent endoscopy module),                            computer-aided polyp detection system powered by AI  was utilized to detect colorectal polyps through                            enhanced visualization during colonoscopy. Recommendation:           - Patient has a contact number available for                            emergencies. The signs and symptoms of potential                            delayed complications were discussed with the                            patient. Return to normal activities tomorrow.                            Written discharge instructions were provided to the                            patient.                           - Resume previous diet.                           - Continue present medications.                           - Await pathology results.                           - Repeat colonoscopy (date not yet determined) for                            surveillance based on pathology results. Obelia Bonello E. Tomasa Rand, MD 05/11/2022 9:20:08 AM This report has been signed electronically.

## 2022-05-12 ENCOUNTER — Telehealth: Payer: Self-pay | Admitting: *Deleted

## 2022-05-12 NOTE — Telephone Encounter (Signed)
Left message on f/u call 

## 2022-05-20 NOTE — Progress Notes (Signed)
Jasmine Howard,  Good news: the polyp that I removed during your recent examination was NOT precancerous.  You should continue to follow current colorectal cancer screening guidelines with a repeat colonoscopy in 10 years.    If you develop any new rectal bleeding, abdominal pain or significant bowel habit changes, please contact me before then.

## 2022-07-08 ENCOUNTER — Ambulatory Visit (INDEPENDENT_AMBULATORY_CARE_PROVIDER_SITE_OTHER): Payer: Commercial Managed Care - PPO | Admitting: Nurse Practitioner

## 2022-07-08 VITALS — BP 112/82 | HR 70 | Temp 98.3°F | Ht 62.0 in | Wt 213.4 lb

## 2022-07-08 DIAGNOSIS — R21 Rash and other nonspecific skin eruption: Secondary | ICD-10-CM

## 2022-07-08 DIAGNOSIS — L509 Urticaria, unspecified: Secondary | ICD-10-CM | POA: Diagnosis not present

## 2022-07-08 LAB — POCT RAPID STREP A (OFFICE): Rapid Strep A Screen: NEGATIVE

## 2022-07-08 MED ORDER — PREDNISONE 20 MG PO TABS
40.0000 mg | ORAL_TABLET | Freq: Every day | ORAL | 0 refills | Status: DC
Start: 2022-07-08 — End: 2022-07-19

## 2022-07-08 MED ORDER — HYDROXYZINE PAMOATE 25 MG PO CAPS
25.0000 mg | ORAL_CAPSULE | Freq: Every evening | ORAL | 0 refills | Status: DC | PRN
Start: 2022-07-08 — End: 2022-07-30

## 2022-07-08 NOTE — Patient Instructions (Addendum)
Prednisone: Take 2 tablets by mouth every morning with food.  This may cause insomnia, irritability, increased appetite, abdominal pain, and rarely bleeding in the stomach.  If you start vomiting blood or what looks like coffee grounds and/or see bloody or black stools stop the prednisone and notify us.  Referred to allergy specialist for further evaluation.  Make sure to follow-up with Dr. Alvy Bimler regarding your rosacea.

## 2022-07-08 NOTE — Assessment & Plan Note (Addendum)
Subacute Etiology unclear Has been getting progressively worse over the last 3 weeks.  Does appear consistent with hives or possible allergic reaction.  Also concern for autoimmune disease and vasculitis.  No murmur noted on exam today, strep negative. Initially we will start with blood work for further evaluation, further recommendations may be made based upon these results. Will trial course of oral prednisone 40 mg daily x 5 days and oral hydroxyzine that she can take as needed at night for the itching.  Patient also educated to use cool compress and avoid scratching with nails.  Will also refer to allergist. Patient to follow-up with PCP in 1 to 2 weeks for close monitoring.

## 2022-07-08 NOTE — Progress Notes (Addendum)
Established Patient Office Visit  Subjective   Patient ID: Jasmine Howard, female    DOB: 1975/09/18  Age: 47 y.o. MRN: [id number]  Chief Complaint  Patient presents with   Rash    Rash on face and both legs, seems to be spreading to the thigh area, been going for three weeks.  Has been progressing worse, itchiness, redness and swelling to it. Have not been taking anything for it till yesterday 07/07/22, OTC benadryl ( did not help much). Not sure of the cause     Patient arrives today for acute visit.  Reports that she has a generalized rash that erupted about 3 weeks ago.  It is quite itchy.  She reports about 10 years ago she had a similar episode, and she was prescribed medicine and this resolved until today (can not recall name of medication).  She has a history of rosacea.     Review of Systems  Constitutional:  Negative for fever.  HENT:  Negative for sore throat.   Respiratory:  Negative for shortness of breath.   Cardiovascular:  Negative for chest pain.  Gastrointestinal:  Positive for abdominal pain.  Skin:  Positive for itching and rash.      Objective:     BP 112/82   Pulse 70   Temp 98.3 F (36.8 C) (Temporal)   Ht 5\' 2"  (1.575 m)   Wt 213 lb 6 oz (96.8 kg)   SpO2 97%   BMI 39.03 kg/m    Physical Exam Vitals reviewed.  Constitutional:      General: She is not in acute distress.    Appearance: Normal appearance.  HENT:     Head: Normocephalic and atraumatic.  Neck:     Vascular: No carotid bruit.  Cardiovascular:     Rate and Rhythm: Normal rate and regular rhythm.     Pulses: Normal pulses.     Heart sounds: Normal heart sounds.  Pulmonary:     Effort: Pulmonary effort is normal.     Breath sounds: Normal breath sounds.  Skin:    General: Skin is warm and dry.     Comments: Generalized maculopapular rash mainly on bilateral legs, arms,face.   Neurological:     General: No focal deficit present.     Mental Status: She is alert and oriented  to person, place, and time.  Psychiatric:        Mood and Affect: Mood normal.        Behavior: Behavior normal.        Judgment: Judgment normal.      Results for orders placed or performed in visit on 07/08/22  Urinalysis, Routine w reflex microscopic  Result Value Ref Range   Color, Urine YELLOW Yellow;Lt. Yellow;Straw;Dark Yellow;Amber;Green;Red;Brown   APPearance CLEAR Clear;Turbid;Slightly Cloudy;Cloudy   Specific Gravity, Urine 1.025 1.000 - 1.030   pH 6.0 5.0 - 8.0   Total Protein, Urine NEGATIVE Negative   Urine Glucose NEGATIVE Negative   Ketones, ur NEGATIVE Negative   Bilirubin Urine NEGATIVE Negative   Hgb urine dipstick NEGATIVE Negative   Urobilinogen, UA 0.2 0.0 - 1.0   Leukocytes,Ua NEGATIVE Negative   Nitrite NEGATIVE Negative   WBC, UA none seen 0-2/hpf   RBC / HPF none seen 0-2/hpf   Mucus, UA Presence of (A) None   Ca Oxalate Crys, UA Presence of (A) None   Amorphous Present (A) None;Present  C-reactive protein  Result Value Ref Range   CRP <1.0 0.5 - 20.0  mg/dL  Sedimentation rate  Result Value Ref Range   Sed Rate 21 (H) 0 - 20 mm/hr  Antinuclear Antib (ANA)  Result Value Ref Range   Anti Nuclear Antibody (ANA) POSITIVE (A) NEGATIVE  Comprehensive metabolic panel  Result Value Ref Range   Sodium 138 135 - 145 mEq/L   Potassium 3.6 3.5 - 5.1 mEq/L   Chloride 100 96 - 112 mEq/L   CO2 31 19 - 32 mEq/L   Glucose, Bld 121 (H) 70 - 99 mg/dL   BUN 10 6 - 23 mg/dL   Creatinine, Ser 8.65 0.40 - 1.20 mg/dL   Total Bilirubin 0.5 0.2 - 1.2 mg/dL   Alkaline Phosphatase 62 39 - 117 U/L   AST 29 0 - 37 U/L   ALT 43 (H) 0 - 35 U/L   Total Protein 7.8 6.0 - 8.3 g/dL   Albumin 4.3 3.5 - 5.2 g/dL   GFR 784.69 >62.95 mL/min   Calcium 9.5 8.4 - 10.5 mg/dL  CBC with Differential/Platelet  Result Value Ref Range   WBC 7.6 4.0 - 10.5 K/uL   RBC 4.67 3.87 - 5.11 Mil/uL   Hemoglobin 13.6 12.0 - 15.0 g/dL   HCT 28.4 13.2 - 44.0 %   MCV 85.7 78.0 - 100.0 fl    MCHC 33.9 30.0 - 36.0 g/dL   RDW 10.2 72.5 - 36.6 %   Platelets 206.0 150.0 - 400.0 K/uL   Neutrophils Relative % 65.4 43.0 - 77.0 %   Lymphocytes Relative 25.4 12.0 - 46.0 %   Monocytes Relative 6.8 3.0 - 12.0 %   Eosinophils Relative 1.7 0.0 - 5.0 %   Basophils Relative 0.7 0.0 - 3.0 %   Neutro Abs 4.9 1.4 - 7.7 K/uL   Lymphs Abs 1.9 0.7 - 4.0 K/uL   Monocytes Absolute 0.5 0.1 - 1.0 K/uL   Eosinophils Absolute 0.1 0.0 - 0.7 K/uL   Basophils Absolute 0.0 0.0 - 0.1 K/uL  Anti-nuclear ab-titer (ANA titer)  Result Value Ref Range   ANA Titer 1 1:80 (H) titer   ANA Pattern 1 Nuclear, Speckled (A)   POCT rapid strep A  Result Value Ref Range   Rapid Strep A Screen Negative Negative      The ASCVD Risk score (Arnett DK, et al., 2019) failed to calculate for the following reasons:   The valid total cholesterol range is 130 to 320 mg/dL    Assessment & Plan:   Problem List Items Addressed This Visit       Musculoskeletal and Integument   Hives - Primary    Subacute Etiology unclear Has been getting progressively worse over the last 3 weeks.  Does appear consistent with hives or possible allergic reaction.  Also concern for autoimmune disease and vasculitis.  No murmur noted on exam today, strep negative. Initially we will start with blood work for further evaluation, further recommendations may be made based upon these results. Will trial course of oral prednisone 40 mg daily x 5 days and oral hydroxyzine that she can take as needed at night for the itching.  Patient also educated to use cool compress and avoid scratching with nails.  Will also refer to allergist. Patient to follow-up with PCP in 1 to 2 weeks for close monitoring.      Relevant Medications   hydrOXYzine (VISTARIL) 25 MG capsule   predniSONE (DELTASONE) 20 MG tablet   Other Relevant Orders   CBC with Differential/Platelet (Completed)   Comprehensive metabolic panel (Completed)  Antinuclear Antib (ANA)  (Completed)   Sedimentation rate (Completed)   C-reactive protein (Completed)   POCT rapid strep A (Completed)   Ambulatory referral to Allergy   Rash    Subacute Etiology unclear Has been getting progressively worse over the last 3 weeks.  Does appear consistent with hives or possible allergic reaction.  Also concern for autoimmune disease and vasculitis.  No murmur noted on exam today, strep negative. Initially we will start with blood work for further evaluation, further recommendations may be made based upon these results. Will trial course of oral prednisone 40 mg daily x 5 days and oral hydroxyzine that she can take as needed at night for the itching.  Patient also educated to use cool compress and avoid scratching with nails.  Will also refer to allergist. Patient to follow-up with PCP in 1 to 2 weeks for close monitoring.      Relevant Medications   hydrOXYzine (VISTARIL) 25 MG capsule   predniSONE (DELTASONE) 20 MG tablet   Other Relevant Orders   CBC with Differential/Platelet (Completed)   Comprehensive metabolic panel (Completed)   Antinuclear Antib (ANA) (Completed)   Sedimentation rate (Completed)   C-reactive protein (Completed)   POCT rapid strep A (Completed)   Urinalysis, Routine w reflex microscopic (Completed)   Ambulatory referral to Allergy    Return for With Dr. Alvy Bimler in 1-2 weeks for close monitoring of rash. Total time spent on encounter today was 30 minutes including face-to-face evaluation, review of previous records, and development/discussion of treatment plan.   Elenore Paddy, NP

## 2022-07-09 LAB — CBC WITH DIFFERENTIAL/PLATELET
Basophils Absolute: 0 10*3/uL (ref 0.0–0.1)
Basophils Relative: 0.7 % (ref 0.0–3.0)
Eosinophils Absolute: 0.1 10*3/uL (ref 0.0–0.7)
Eosinophils Relative: 1.7 % (ref 0.0–5.0)
HCT: 40 % (ref 36.0–46.0)
Hemoglobin: 13.6 g/dL (ref 12.0–15.0)
Lymphocytes Relative: 25.4 % (ref 12.0–46.0)
Lymphs Abs: 1.9 10*3/uL (ref 0.7–4.0)
MCHC: 33.9 g/dL (ref 30.0–36.0)
MCV: 85.7 fl (ref 78.0–100.0)
Monocytes Absolute: 0.5 10*3/uL (ref 0.1–1.0)
Monocytes Relative: 6.8 % (ref 3.0–12.0)
Neutro Abs: 4.9 10*3/uL (ref 1.4–7.7)
Neutrophils Relative %: 65.4 % (ref 43.0–77.0)
Platelets: 206 10*3/uL (ref 150.0–400.0)
RBC: 4.67 Mil/uL (ref 3.87–5.11)
RDW: 13.3 % (ref 11.5–15.5)
WBC: 7.6 10*3/uL (ref 4.0–10.5)

## 2022-07-09 LAB — URINALYSIS, ROUTINE W REFLEX MICROSCOPIC
Bilirubin Urine: NEGATIVE
Hgb urine dipstick: NEGATIVE
Ketones, ur: NEGATIVE
Leukocytes,Ua: NEGATIVE
Nitrite: NEGATIVE
RBC / HPF: NONE SEEN (ref 0–?)
Specific Gravity, Urine: 1.025 (ref 1.000–1.030)
Total Protein, Urine: NEGATIVE
Urine Glucose: NEGATIVE
Urobilinogen, UA: 0.2 (ref 0.0–1.0)
WBC, UA: NONE SEEN (ref 0–?)
pH: 6 (ref 5.0–8.0)

## 2022-07-09 LAB — COMPREHENSIVE METABOLIC PANEL
ALT: 43 U/L — ABNORMAL HIGH (ref 0–35)
AST: 29 U/L (ref 0–37)
Albumin: 4.3 g/dL (ref 3.5–5.2)
Alkaline Phosphatase: 62 U/L (ref 39–117)
BUN: 10 mg/dL (ref 6–23)
CO2: 31 mEq/L (ref 19–32)
Calcium: 9.5 mg/dL (ref 8.4–10.5)
Chloride: 100 mEq/L (ref 96–112)
Creatinine, Ser: 0.69 mg/dL (ref 0.40–1.20)
GFR: 102.89 mL/min (ref >60.00)
Glucose, Bld: 121 mg/dL — ABNORMAL HIGH (ref 70–99)
Potassium: 3.6 mEq/L (ref 3.5–5.1)
Sodium: 138 mEq/L (ref 135–145)
Total Bilirubin: 0.5 mg/dL (ref 0.2–1.2)
Total Protein: 7.8 g/dL (ref 6.0–8.3)

## 2022-07-09 LAB — SEDIMENTATION RATE: Sed Rate: 21 mm/hr — ABNORMAL HIGH (ref 0–20)

## 2022-07-09 LAB — C-REACTIVE PROTEIN: CRP: <1 mg/dL (ref 0.5–20.0)

## 2022-07-10 LAB — ANTI-NUCLEAR AB-TITER (ANA TITER): ANA Titer 1: 1:80 {titer} — ABNORMAL HIGH

## 2022-07-10 LAB — ANA: Anti Nuclear Antibody (ANA): POSITIVE — AB

## 2022-07-12 ENCOUNTER — Other Ambulatory Visit: Payer: Self-pay | Admitting: Nurse Practitioner

## 2022-07-12 DIAGNOSIS — R768 Other specified abnormal immunological findings in serum: Secondary | ICD-10-CM

## 2022-07-12 DIAGNOSIS — R21 Rash and other nonspecific skin eruption: Secondary | ICD-10-CM

## 2022-07-14 ENCOUNTER — Telehealth: Payer: Self-pay | Admitting: Emergency Medicine

## 2022-07-14 NOTE — Telephone Encounter (Signed)
Patient returned Jasmine Howard's call about her lab results. She would like a call back at [phone].

## 2022-07-15 NOTE — Telephone Encounter (Signed)
Called pt and made pt aware of lab results

## 2022-07-15 NOTE — Telephone Encounter (Signed)
Patient returned Jasmine Howard's call. She would like a call back at [phone]. She said she would be home until 1:30pm.

## 2022-07-19 ENCOUNTER — Ambulatory Visit (INDEPENDENT_AMBULATORY_CARE_PROVIDER_SITE_OTHER): Payer: Commercial Managed Care - PPO | Admitting: Emergency Medicine

## 2022-07-19 ENCOUNTER — Encounter: Payer: Self-pay | Admitting: Emergency Medicine

## 2022-07-19 VITALS — BP 114/72 | HR 62 | Temp 98.3°F | Ht 62.0 in | Wt 215.0 lb

## 2022-07-19 DIAGNOSIS — N951 Menopausal and female climacteric states: Secondary | ICD-10-CM

## 2022-07-19 DIAGNOSIS — R21 Rash and other nonspecific skin eruption: Secondary | ICD-10-CM

## 2022-07-19 DIAGNOSIS — E1159 Type 2 diabetes mellitus with other circulatory complications: Secondary | ICD-10-CM

## 2022-07-19 DIAGNOSIS — K760 Fatty (change of) liver, not elsewhere classified: Secondary | ICD-10-CM

## 2022-07-19 DIAGNOSIS — I152 Hypertension secondary to endocrine disorders: Secondary | ICD-10-CM

## 2022-07-19 DIAGNOSIS — Z7984 Long term (current) use of oral hypoglycemic drugs: Secondary | ICD-10-CM

## 2022-07-19 DIAGNOSIS — E039 Hypothyroidism, unspecified: Secondary | ICD-10-CM

## 2022-07-19 NOTE — Progress Notes (Signed)
Jasmine Howard 47 y.o.   Chief Complaint  Patient presents with   Medical Management of Chronic Issues    1-2 week rash f/u appt, patient states when she was on the medication the rash went away. States about 2-3 days after stopping rash came back.     HISTORY OF PRESENT ILLNESS: This is a 47 y.o. female complaining of chronic facial rash for the past several weeks Also here for follow-up of chronic medical problems Recent blood test weakly positive for ANA No history of arthritis/arthralgias No history of anemia Perimenopausal.  Recently had IUD removed Partial response to oral corticosteroids No other associated symptoms Has been very busy for the past 4 weeks with increased emotional stress History of hypertension and diabetes History of hypothyroidism  HPI   Prior to Admission medications   Medication Sig Start Date End Date Taking? Authorizing Provider  Acetaminophen (TYLENOL PO) Take by mouth as needed.   Yes [provider]  hydrochlorothiazide (HYDRODIURIL) 12.5 MG tablet TAKE 1 TABLET BY MOUTH EVERY DAY 10/23/21  Yes Flo Berroa, Eilleen Kempf, MD  hydrOXYzine (VISTARIL) 25 MG capsule Take 1 capsule (25 mg total) by mouth at bedtime as needed for itching. 07/08/22  Yes Elenore Paddy, NP  IBUPROFEN PO Take by mouth as needed.   Yes [provider]  levonorgestrel (MIRENA) 20 MCG/24HR IUD 1 Intra Uterine Device (1 each total) by Intrauterine route once. 10/25/12  Yes Newt Lukes, MD  levothyroxine (SYNTHROID) 50 MCG tablet TAKE 1 TABLET BY MOUTH DAILY BEFORE BREAKFAST 11/09/21  Yes Fleetwood Pierron, Eilleen Kempf, MD  metFORMIN (GLUCOPHAGE-XR) 500 MG 24 hr tablet TAKE 1 TABLET BY MOUTH 2 TIMES DAILY WITH A MEAL. 11/09/21  Yes Jaeger Trueheart, Eilleen Kempf, MD  simvastatin (ZOCOR) 20 MG tablet Take 1 tablet (20 mg total) by mouth at bedtime. 03/17/22 03/12/23 Yes Georgina Quint, MD    No Known Allergies  Patient Active Problem List   Diagnosis Date Noted   Hives  07/08/2022   Rash 07/08/2022   History of osteoarthritis 03/17/2022   Rheumatism 03/17/2022   Bilateral leg pain 03/17/2022   Hypertension associated with diabetes (HCC) 01/22/2021   Hypothyroidism 12/10/2009   Type 2 diabetes mellitus with hyperglycemia, without long-term current use of insulin (HCC) 12/10/2009   Irritable bowel syndrome 03/31/2009   FATTY LIVER DISEASE 03/31/2009    Past Medical History:  Diagnosis Date   Allergy    seasonal   ANEMIA-NOS    Arthritis    left arm   BENIGN POSITIONAL VERTIGO    Cervical incompetence    s/p cerclage 03/01/12   DEPRESSION    DIABETES MELLITUS, TYPE II    metformin   FATTY LIVER DISEASE    Hypertension    HYPOTHYROIDISM    Infertility, female    IRITIS    Irritable bowel syndrome    Polycystic ovaries    Postpartum care following cesarean delivery (breech 11/16) 12/12/2010    Past Surgical History:  Procedure Laterality Date   CERVICAL CERCLAGE N/A 03/03/2012   Procedure: CERCLAGE CERVICAL;  Surgeon: Tresa Endo A. Ernestina Penna, MD;  Location: [facility];  Service: Gynecology;  Laterality: N/A;  EDD:  09/07/12   CESAREAN SECTION  12/11/2010   Procedure: CESAREAN SECTION;  Surgeon: Tresa Endo A. Fogleman;  Location: [facility];  Service: Gynecology;  Laterality: N/A;   CESAREAN SECTION N/A 09/01/2012   Procedure: Repeat CESAREAN SECTION;  Surgeon: Tresa Endo A. Ernestina Penna, MD;  Location: [facility];  Service: Obstetrics;  Laterality: N/A;  EDD: 09/07/12   CHOLECYSTECTOMY  2009   SCAR REVISION  09/01/2012   Procedure: SCAR REVISION;  Surgeon: Tresa Endo A. Ernestina Penna, MD;  Location: [facility];  Service: Obstetrics;;  cesarean section scar revision    Social History   Socioeconomic History   Marital status: Married    Spouse name: Not on file   Number of children: Not on file   Years of education: Not on file   Highest education level: GED or equivalent  Occupational History   Not on file  Tobacco Use   Smoking status: Never   Smokeless tobacco: Never   Tobacco  comments:    Married, lives with spouse and 2 kids. 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, museum Use: Never used  Substance and Sexual Activity   Alcohol use: No   Drug use: No   Sexual activity: Never  Other Topics Concern   Not on file  Social History Narrative   Not on file   Social Determinants of Health   Financial Resource Strain: Low Risk  (07/16/2022)   Overall Financial Resource Strain (CARDIA)    Difficulty of Paying Living Expenses: Not hard at all  Food Insecurity: Unknown (07/16/2022)   Hunger Vital Sign    Worried About Running Out of Food in the Last Year: Not on file    Ran Out of Food in the Last Year: Never true  Transportation Needs: No Transportation Needs (07/16/2022)   PRAPARE - Administrator, Civil Service (Medical): No    Lack of Transportation (Non-Medical): No  Physical Activity: Sufficiently Active (07/16/2022)   Exercise Vital Sign    Days of Exercise per Week: 3 days    Minutes of Exercise per Session: 60 min  Stress: No Stress Concern Present (07/16/2022)   [employer] of Occupational Health - Occupational Stress Questionnaire    Feeling of Stress : Not at all  Social Connections: Socially Integrated (07/16/2022)   Social Connection and Isolation Panel [NHANES]    Frequency of Communication with Friends and Family: Three times a week    Frequency of Social Gatherings with Friends and Family: Once a week    Attends Religious Services: More than 4 times per year    Active Member of [employer] or Organizations: Yes    Attends Engineer, structural: More than 4 times per year    Marital Status: Married  Catering manager Violence: Not on file    Family History  Problem Relation Age of Onset   Stomach cancer Maternal Aunt    Colon cancer Maternal Grandmother    Colon cancer Maternal Grandfather    Arthritis Other        Parent   Alcohol abuse Other        Parent   Colon polyps Neg Hx    Crohn's  disease Neg Hx    Esophageal cancer Neg Hx    Rectal cancer Neg Hx    Ulcerative colitis Neg Hx      Review of Systems  Constitutional: Negative.  Negative for chills and fever.  HENT: Negative.  Negative for congestion and sore throat.   Respiratory: Negative.  Negative for cough and shortness of breath.   Cardiovascular: Negative.  Negative for chest pain and palpitations.  Gastrointestinal:  Negative for abdominal pain, diarrhea, nausea and vomiting.  Genitourinary: Negative.   Musculoskeletal:  Negative for joint pain and myalgias.  Skin:  Positive for itching and rash.  Neurological: Negative.  Negative for dizziness and headaches.  All other systems reviewed and are negative.   Vitals:   07/19/22 1052  BP: 114/72  Pulse: 62  Temp: 98.3 F (36.8 C)  SpO2: 95%    Physical Exam HENT:     Head: Normocephalic.  Eyes:     Extraocular Movements: Extraocular movements intact.     Pupils: Pupils are equal, round, and reactive to light.  Cardiovascular:     Rate and Rhythm: Normal rate and regular rhythm.     Pulses: Normal pulses.     Heart sounds: Normal heart sounds.  Pulmonary:     Effort: Pulmonary effort is normal.     Breath sounds: Normal breath sounds.  Musculoskeletal:     Cervical back: No tenderness.     Right lower leg: No edema.     Left lower leg: No edema.  Lymphadenopathy:     Cervical: No cervical adenopathy.  Skin:    General: Skin is warm and dry.     Findings: Rash (Facial rash.  See picture below) present.  Neurological:     Mental Status: She is alert and oriented to person, place, and time.  Psychiatric:        Mood and Affect: Mood normal.        Behavior: Behavior normal.      ASSESSMENT & PLAN: A total of 44 minutes was spent with the patient and counseling/coordination of care regarding preparing for this visit, review of most recent office visit notes, review of multiple chronic medical conditions under management, review of all  medications, review of most recent blood work results, education on nutrition, cardiovascular risks associated with hypertension and diabetes, prognosis, need for evaluation by rheumatologist and dermatologist, documentation and need for follow-up.  Problem List Items Addressed This Visit       Cardiovascular and Mediastinum   Hypertension associated with diabetes (HCC)    BP Readings from Last 3 Encounters:  07/19/22 114/72  07/08/22 112/82  05/11/22 (!) 104/57  Well-controlled hypertension. Continue hydrochlorothiazide 12.5 mg daily Well-controlled diabetes Lab Results  Component Value Date   HGBA1C 5.7 03/17/2022  Continue metformin 500 mg twice a day Cardiovascular risks associated with hypertension and diabetes discussed Diet and nutrition discussed          Digestive   Hepatic steatosis    Diet and nutrition discussed Continue simvastatin 20 mg daily        Endocrine   Hypothyroidism    Clinically euthyroid. Lab Results  Component Value Date   TSH 5.44 03/17/2022  Continue Synthroid 50 mcg daily         Musculoskeletal and Integument   Rash of face - Primary    Stable.  Multifactorial. Differential diagnosis discussed Weakly positive ANA test. Still no clinical criteria for lupus Has rheumatology appointment next October Recommend dermatology evaluation Referral placed today.      Relevant Orders   Ambulatory referral to Dermatology     Other   Perimenopausal    May be contributing to symptoms Recommend follow-up with her gynecologist      Patient Instructions  Erupcin cutnea en los adultos Rash, Adult  [place] erupcin cutnea es una erupcin de manchas o parches en la piel. Es un cambio en el aspecto y la sensibilidad de la piel. Muchas cosas pueden ocasionar una erupcin cutnea. El objetivo del tratamiento es calmar la picazn y evitar que la erupcin se propague. Siga estas instrucciones en su casa: Medicamentos Use o aplique los  medicamentos de venta libre y los recetados solamente como se lo haya indicado el mdico. Estos podran incluir medicamentos para tratar lo siguiente: Piel enrojecida e hinchada. Picazn. Vella Raring. Dolor. Una infeccin.  Cuidado de la piel Aplique paos fros y hmedos en la zona de la erupcin. No se rasque ni se frote la piel. Trate de no cubrir la zona de la erupcin. Mantngala expuesta al aire tanto como sea posible. Control de la picazn y las [employer] baos o las duchas calientes. Estos pueden empeorar la picazn. Neomia Dear ducha fra puede Acupuncturist. Trate de tomar un bao con lo siguiente: Sales de [employer]. Puede conseguirlas en la tienda de comestibles o en la farmacia. Siga las instrucciones del envase. Bicarbonato de sodio. Vierta un poco en la baera como se lo haya indicado el mdico. Avena coloidal. Puede conseguirla en la tienda de comestibles o en la farmacia. Siga las instrucciones del envase. Intente colocarse una pasta de bicarbonato de [employer]. Agregue agua al bicarbonato de sodio hasta que se forme una pasta. Intente aplicarse una locin para Associate Professor (locin de calamina). Mantngase fresco. Evite exponerse al sol. La transpiracin y el calor pueden empeorar la picazn. Instrucciones generales  Descanse todo lo que sea necesario. Beba suficiente lquido para Radio producer pis (orina) de color amarillo plido. Use ropa suelta. Evite los detergentes y los jabones perfumados, y los perfumes. Utilice jabones, detergentes, perfumes y cosmticos suaves. Evite usar lo que ha causado la erupcin. Lleve un diario como ayuda para registrar lo que le causa erupcin. Escriba los siguientes datos: Lo que come. Qu productos cosmticos [place]. Lo que bebe. La ropa que [place]. Esto incluye las alhajas. Comunquese con un mdico si: Transpira mucho de noche. Hace pis (orina) ms o menos de lo normal. La orina es de color ms oscuro que lo  normal. Los ojos tienen sensibilidad a Statistician. La piel y la parte blanca de los ojos se ponen amarillos. Tiene adormecimiento o siente hormigueo en la piel. Tiene ampollas dolorosas en la nariz o la boca. La erupcin cutnea no desaparece despus de [employer]. Est ms cansado que lo habitual. Est ms sediento que lo habitual. Tiene sntomas nuevos o estos empeoran. Pueden incluir: Tree surgeon. Grant Ruts. Materia fecal lquida (diarrea). Vmitos. Debilidad. Prdida de peso. Solicite ayuda de inmediato si: Empieza a sentirse desorientado (confundido). Siente un dolor de cabeza intenso o tiene rigidez en el cuello. Siente mucho dolor o rigidez en las articulaciones. Se siente muy somnoliento o no reacciona. Tiene una convulsin. Esta informacin no tiene Theme park manager el consejo del mdico. Asegrese de hacerle al mdico cualquier pregunta que tenga. Document Revised: 02/20/2022 Document Reviewed: 02/20/2022 Elsevier Patient Education  2024 Elsevier Inc.    Edwina Barth, MD [facility]

## 2022-07-19 NOTE — Patient Instructions (Signed)
Erupcin cutnea en los adultos Rash, Adult  [place] erupcin cutnea es una erupcin de manchas o parches en la piel. Es un cambio en el aspecto y la sensibilidad de la piel. Muchas cosas pueden ocasionar una erupcin cutnea. El objetivo del tratamiento es calmar la picazn y evitar que la erupcin se propague. Siga estas instrucciones en su casa: Medicamentos Use o aplique los medicamentos de venta libre y los recetados solamente como se lo haya indicado el mdico. Estos podran incluir medicamentos para tratar lo siguiente: Piel enrojecida e hinchada. Picazn. Vella Raring. Dolor. Una infeccin.  Cuidado de la piel Aplique paos fros y hmedos en la zona de la erupcin. No se rasque ni se frote la piel. Trate de no cubrir la zona de la erupcin. Mantngala expuesta al aire tanto como sea posible. Control de la picazn y las [employer] baos o las duchas calientes. Estos pueden empeorar la picazn. Neomia Dear ducha fra puede Acupuncturist. Trate de tomar un bao con lo siguiente: Sales de [employer]. Puede conseguirlas en la tienda de comestibles o en la farmacia. Siga las instrucciones del envase. Bicarbonato de sodio. Vierta un poco en la baera como se lo haya indicado el mdico. Avena coloidal. Puede conseguirla en la tienda de comestibles o en la farmacia. Siga las instrucciones del envase. Intente colocarse una pasta de bicarbonato de [employer]. Agregue agua al bicarbonato de sodio hasta que se forme una pasta. Intente aplicarse una locin para Associate Professor (locin de calamina). Mantngase fresco. Evite exponerse al sol. La transpiracin y el calor pueden empeorar la picazn. Instrucciones generales  Descanse todo lo que sea necesario. Beba suficiente lquido para Radio producer pis (orina) de color amarillo plido. Use ropa suelta. Evite los detergentes y los jabones perfumados, y los perfumes. Utilice jabones, detergentes, perfumes y cosmticos suaves. Evite  usar lo que ha causado la erupcin. Lleve un diario como ayuda para registrar lo que le causa erupcin. Escriba los siguientes datos: Lo que come. Qu productos cosmticos [place]. Lo que bebe. La ropa que [place]. Esto incluye las alhajas. Comunquese con un mdico si: Transpira mucho de noche. Hace pis (orina) ms o menos de lo normal. La orina es de color ms oscuro que lo normal. Los ojos tienen sensibilidad a Statistician. La piel y la parte blanca de los ojos se ponen amarillos. Tiene adormecimiento o siente hormigueo en la piel. Tiene ampollas dolorosas en la nariz o la boca. La erupcin cutnea no desaparece despus de [employer]. Est ms cansado que lo habitual. Est ms sediento que lo habitual. Tiene sntomas nuevos o estos empeoran. Pueden incluir: Tree surgeon. Grant Ruts. Materia fecal lquida (diarrea). Vmitos. Debilidad. Prdida de peso. Solicite ayuda de inmediato si: Empieza a sentirse desorientado (confundido). Siente un dolor de cabeza intenso o tiene rigidez en el cuello. Siente mucho dolor o rigidez en las articulaciones. Se siente muy somnoliento o no reacciona. Tiene una convulsin. Esta informacin no tiene Theme park manager el consejo del mdico. Asegrese de hacerle al mdico cualquier pregunta que tenga. Document Revised: 02/20/2022 Document Reviewed: 02/20/2022 Elsevier Patient Education  2024 [employer].

## 2022-07-19 NOTE — Assessment & Plan Note (Signed)
May be contributing to symptoms Recommend follow-up with her gynecologist

## 2022-07-19 NOTE — Assessment & Plan Note (Signed)
Clinically euthyroid. Lab Results  Component Value Date   TSH 5.44 03/17/2022  Continue Synthroid 50 mcg daily

## 2022-07-19 NOTE — Assessment & Plan Note (Signed)
Stable.  Multifactorial. Differential diagnosis discussed Weakly positive ANA test. Still no clinical criteria for lupus Has rheumatology appointment next October Recommend dermatology evaluation Referral placed today.

## 2022-07-19 NOTE — Assessment & Plan Note (Signed)
Diet and nutrition discussed.  Continue simvastatin 20 mg daily. 

## 2022-07-19 NOTE — Assessment & Plan Note (Signed)
BP Readings from Last 3 Encounters:  07/19/22 114/72  07/08/22 112/82  05/11/22 (!) 104/57  Well-controlled hypertension. Continue hydrochlorothiazide 12.5 mg daily Well-controlled diabetes Lab Results  Component Value Date   HGBA1C 5.7 03/17/2022  Continue metformin 500 mg twice a day Cardiovascular risks associated with hypertension and diabetes discussed Diet and nutrition discussed

## 2022-07-30 ENCOUNTER — Other Ambulatory Visit: Payer: Self-pay | Admitting: Nurse Practitioner

## 2022-07-30 DIAGNOSIS — L509 Urticaria, unspecified: Secondary | ICD-10-CM

## 2022-07-30 DIAGNOSIS — R21 Rash and other nonspecific skin eruption: Secondary | ICD-10-CM

## 2022-11-18 ENCOUNTER — Other Ambulatory Visit: Payer: Self-pay | Admitting: Emergency Medicine

## 2022-11-18 DIAGNOSIS — E1165 Type 2 diabetes mellitus with hyperglycemia: Secondary | ICD-10-CM

## 2022-11-18 DIAGNOSIS — E039 Hypothyroidism, unspecified: Secondary | ICD-10-CM

## 2022-11-24 ENCOUNTER — Encounter: Payer: Commercial Managed Care - PPO | Admitting: Internal Medicine

## 2022-11-24 NOTE — Progress Notes (Deleted)
Office Visit Note  Patient: Jasmine Howard             Date of Birth: 12-29-1975           MRN: [id number]             PCP: Georgina Quint, MD Referring: Elenore Paddy, NP Visit Date: 11/24/2022 Occupation: @GUAROCC @  Subjective:  No chief complaint on file.   History of Present Illness: Jasmine Howard is a 47 y.o. female ***     Activities of Daily Living:  Patient reports morning stiffness for *** {minute/hour:19697}.   Patient {ACTIONS;DENIES/REPORTS:21021675::"Denies"} nocturnal pain.  Difficulty dressing/grooming: {ACTIONS;DENIES/REPORTS:21021675::"Denies"} Difficulty climbing stairs: {ACTIONS;DENIES/REPORTS:21021675::"Denies"} Difficulty getting out of chair: {ACTIONS;DENIES/REPORTS:21021675::"Denies"} Difficulty using hands for taps, buttons, cutlery, and/or writing: {ACTIONS;DENIES/REPORTS:21021675::"Denies"}  No Rheumatology ROS completed.   PMFS History:  Patient Active Problem List   Diagnosis Date Noted   Perimenopausal 07/19/2022   Hives 07/08/2022   Rash of face 07/08/2022   History of osteoarthritis 03/17/2022   Rheumatism 03/17/2022   Hypertension associated with diabetes (HCC) 01/22/2021   Hypothyroidism 12/10/2009   Type 2 diabetes mellitus with hyperglycemia, without long-term current use of insulin (HCC) 12/10/2009   Irritable bowel syndrome 03/31/2009   Hepatic steatosis 03/31/2009    Past Medical History:  Diagnosis Date   Allergy    seasonal   ANEMIA-NOS    Arthritis    left arm   BENIGN POSITIONAL VERTIGO    Cervical incompetence    s/p cerclage 03/01/12   DEPRESSION    DIABETES MELLITUS, TYPE II    metformin   FATTY LIVER DISEASE    Hypertension    HYPOTHYROIDISM    Infertility, female    IRITIS    Irritable bowel syndrome    Polycystic ovaries    Postpartum care following cesarean delivery (breech 11/16) 12/12/2010    Family History  Problem Relation Age of Onset   Stomach cancer Maternal Aunt    Colon cancer  Maternal Grandmother    Colon cancer Maternal Grandfather    Arthritis Other        Parent   Alcohol abuse Other        Parent   Colon polyps Neg Hx    Crohn's disease Neg Hx    Esophageal cancer Neg Hx    Rectal cancer Neg Hx    Ulcerative colitis Neg Hx    Past Surgical History:  Procedure Laterality Date   CERVICAL CERCLAGE N/A 03/03/2012   Procedure: CERCLAGE CERVICAL;  Surgeon: Tresa Endo A. Ernestina Penna, MD;  Location: [facility];  Service: Gynecology;  Laterality: N/A;  EDD:  09/07/12   CESAREAN SECTION  12/11/2010   Procedure: CESAREAN SECTION;  Surgeon: Tresa Endo A. Fogleman;  Location: [facility];  Service: Gynecology;  Laterality: N/A;   CESAREAN SECTION N/A 09/01/2012   Procedure: Repeat CESAREAN SECTION;  Surgeon: Tresa Endo A. Ernestina Penna, MD;  Location: [facility];  Service: Obstetrics;  Laterality: N/A;  EDD: 09/07/12   CHOLECYSTECTOMY  2009   SCAR REVISION  09/01/2012   Procedure: SCAR REVISION;  Surgeon: Tresa Endo A. Ernestina Penna, MD;  Location: [facility];  Service: Obstetrics;;  cesarean section scar revision   Social History   Social History Narrative   Not on file   Immunization History  Administered Date(s) Administered   Influenza Split 12/30/2011   Influenza,inj,Quad PF,6+ Mos 01/22/2021, 10/06/2021   Td 04/25/2008   Tdap 10/06/2021     Objective: Vital Signs: There were no vitals taken for this visit.  Physical Exam   Musculoskeletal Exam: ***  CDAI Exam: CDAI Score: -- Patient Global: --; Provider Global: -- Swollen: --; Tender: -- Joint Exam 11/24/2022   No joint exam has been documented for this visit   There is currently no information documented on the homunculus. Go to the Rheumatology activity and complete the homunculus joint exam.  Investigation: No additional findings.  Imaging: No results found.  Recent Labs: Lab Results  Component Value Date   WBC 7.6 07/08/2022   HGB 13.6 07/08/2022   PLT 206.0 07/08/2022   NA 138 07/08/2022   K 3.6 07/08/2022   CL 100  07/08/2022   CO2 31 07/08/2022   GLUCOSE 121 (H) 07/08/2022   BUN 10 07/08/2022   CREATININE 0.69 07/08/2022   BILITOT 0.5 07/08/2022   ALKPHOS 62 07/08/2022   AST 29 07/08/2022   ALT 43 (H) 07/08/2022   PROT 7.8 07/08/2022   ALBUMIN 4.3 07/08/2022   CALCIUM 9.5 07/08/2022   GFRAA 129 08/17/2016    Speciality Comments: No specialty comments available.  Procedures:  No procedures performed Allergies: Patient has no known allergies.   Assessment / Plan:     Visit Diagnoses: No diagnosis found.  Orders: No orders of the defined types were placed in this encounter.  No orders of the defined types were placed in this encounter.   Face-to-face time spent with patient was *** minutes. Greater than 50% of time was spent in counseling and coordination of care.  Follow-Up Instructions: No follow-ups on file.   Fuller Plan, MD  Note - This record has been created using [employer].  Chart creation errors have been sought, but may not always  have been located. Such creation errors do not reflect on  the standard of medical care.

## 2022-11-27 LAB — HM DIABETES EYE EXAM

## 2022-12-25 ENCOUNTER — Other Ambulatory Visit: Payer: Self-pay | Admitting: Emergency Medicine

## 2022-12-25 DIAGNOSIS — I10 Essential (primary) hypertension: Secondary | ICD-10-CM

## 2022-12-25 DIAGNOSIS — R609 Edema, unspecified: Secondary | ICD-10-CM

## 2023-01-05 ENCOUNTER — Encounter: Payer: Self-pay | Admitting: Emergency Medicine

## 2023-01-05 ENCOUNTER — Ambulatory Visit (INDEPENDENT_AMBULATORY_CARE_PROVIDER_SITE_OTHER): Payer: Commercial Managed Care - PPO

## 2023-01-05 ENCOUNTER — Ambulatory Visit (INDEPENDENT_AMBULATORY_CARE_PROVIDER_SITE_OTHER): Payer: Commercial Managed Care - PPO | Admitting: Emergency Medicine

## 2023-01-05 VITALS — BP 120/78 | HR 70 | Temp 98.5°F | Ht 62.0 in | Wt 217.0 lb

## 2023-01-05 DIAGNOSIS — M25551 Pain in right hip: Secondary | ICD-10-CM

## 2023-01-05 DIAGNOSIS — M5440 Lumbago with sciatica, unspecified side: Secondary | ICD-10-CM

## 2023-01-05 DIAGNOSIS — M545 Low back pain, unspecified: Secondary | ICD-10-CM | POA: Insufficient documentation

## 2023-01-05 MED ORDER — TRAMADOL HCL 50 MG PO TABS: 50.0000 mg | ORAL_TABLET | Freq: Three times a day (TID) | ORAL | 0 refills | Status: AC | PRN

## 2023-01-05 MED ORDER — MELOXICAM 15 MG PO TABS
15.0000 mg | ORAL_TABLET | Freq: Every day | ORAL | 0 refills | Status: DC
Start: 2023-01-05 — End: 2023-02-01
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
## 2023-01-05 NOTE — Assessment & Plan Note (Signed)
Active and affecting quality of life X-rays done today.  Report reviewed. Pain management discussed Over-the-counter medication not helping Recommend meloxicam 15 mg daily Tramadol 50 mg as needed for moderate to severe pain Needs orthopedic evaluation Referral placed today

## 2023-01-05 NOTE — Patient Instructions (Signed)
Dolor de cadera Hip Pain La cadera es la articulacin entre la parte superior de las piernas y la parte inferior de la pelvis. Los [place], Research scientist (physical sciences), los tendones y los msculos de la articulacin de la cadera sostienen el peso del cuerpo y permiten el desplazamiento. El Engineer, mining de cadera puede tener distintos grados, desde un dolor leve hasta un dolor intenso en uno o ambos lados de la cadera. El dolor puede sentirse en la parte interna de la articulacin de la cadera, cerca de la ingle, o en la parte externa, cerca de las nalgas y la parte superior de los muslos. Tambin puede estar acompaado de hinchazn o rigidez en la zona de la cadera. Siga estas instrucciones en su casa: Control del dolor, la rigidez y la hinchazn     Si se lo indican, aplique hielo sobre la zona dolorida. Ponga el hielo en una bolsa plstica. Coloque una toalla entre la piel y Copy. Aplique el hielo durante 20 minutos, 2 a 3 veces por da. Si se lo indican, aplique calor en la zona afectada con la frecuencia que le haya indicado el mdico. Use la fuente de calor que el mdico le recomiende, como una compresa de calor hmedo o una almohadilla trmica. Coloque una toalla entre la piel y la fuente de Airline pilot. Aplique calor durante 20 a 30 minutos. Si la piel se le pone de color rojo brillante, retire el hielo o Company secretary de inmediato para evitar daos en la piel. El [place] de dao es mayor si no puede sentir dolor, Airline pilot o fro. Actividad Haga ejercicios como se lo haya indicado el mdico. Evite las actividades que le causan dolor. Instrucciones generales  Use los medicamentos de venta libre y los recetados solamente como se lo haya indicado el mdico. Lleve un registro de los sntomas. Escriba los siguientes datos: Con qu frecuencia le duele la cadera. La ubicacin del dolor. Cmo se siente el dolor. Qu es lo que hace que el dolor empeore. Duerma con una almohada entre las piernas del lado que le sea ms  cmodo. Concurra a todas las visitas de seguimiento. El [address] un control de su dolor y de [place]. Comunquese con un mdico si: No puede apoyar el peso del cuerpo [employer]. El dolor o la hinchazn empeoran despus de [place]. Tiene ms dificultades para caminar. Tiene fiebre. Solicite ayuda de inmediato si: Se cae. El dolor y la hinchazn de la cadera aumentan de repente. La cadera est enrojecida o hinchada, o muy dolorida con la palpacin. Esta informacin no tiene Theme park manager el consejo del mdico. Asegrese de hacerle al mdico cualquier pregunta que tenga. Document Revised: 10/16/2021 Document Reviewed: 10/16/2021 Elsevier Patient Education  2024 [employer].

## 2023-01-12 ENCOUNTER — Encounter: Payer: Self-pay | Admitting: Physician Assistant

## 2023-01-12 ENCOUNTER — Ambulatory Visit (INDEPENDENT_AMBULATORY_CARE_PROVIDER_SITE_OTHER): Payer: Commercial Managed Care - PPO | Admitting: Physician Assistant

## 2023-01-12 DIAGNOSIS — M5441 Lumbago with sciatica, right side: Secondary | ICD-10-CM

## 2023-01-12 DIAGNOSIS — G8929 Other chronic pain: Secondary | ICD-10-CM

## 2023-01-12 MED ORDER — METHYLPREDNISOLONE 4 MG PO TBPK: ORAL_TABLET | ORAL | 0 refills | Status: DC

## 2023-01-12 NOTE — Addendum Note (Signed)
Addended by: Polly Cobia on: 01/12/2023 10:48 AM   Modules accepted: Orders
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
## 2023-02-01 ENCOUNTER — Other Ambulatory Visit: Payer: Self-pay | Admitting: Emergency Medicine

## 2023-02-01 DIAGNOSIS — M25551 Pain in right hip: Secondary | ICD-10-CM

## 2023-02-01 DIAGNOSIS — M5440 Lumbago with sciatica, unspecified side: Secondary | ICD-10-CM

## 2023-02-07 ENCOUNTER — Other Ambulatory Visit: Payer: Self-pay

## 2023-02-07 ENCOUNTER — Ambulatory Visit: Payer: Commercial Managed Care - PPO | Attending: Physician Assistant | Admitting: Physical Therapy

## 2023-02-07 DIAGNOSIS — G8929 Other chronic pain: Secondary | ICD-10-CM | POA: Insufficient documentation

## 2023-02-07 DIAGNOSIS — M5441 Lumbago with sciatica, right side: Secondary | ICD-10-CM | POA: Insufficient documentation

## 2023-02-07 DIAGNOSIS — M5459 Other low back pain: Secondary | ICD-10-CM | POA: Insufficient documentation

## 2023-02-07 NOTE — Therapy (Signed)
 Patient arrived and completed subjective portion when of evaluation only when her benefits where verified and discovered that she had no PT benefits.  Explained costs of PT evaluation and she elected to not proceed further with the evaluation today.  Will refer to the [facility] pro bono clinic.   Jasmine Howard, PT  02/07/2023 12:15 PM

## 2023-02-24 ENCOUNTER — Encounter: Payer: Self-pay | Admitting: Physician Assistant

## 2023-02-24 ENCOUNTER — Ambulatory Visit: Payer: Commercial Managed Care - PPO | Admitting: Physician Assistant

## 2023-02-24 DIAGNOSIS — M5440 Lumbago with sciatica, unspecified side: Secondary | ICD-10-CM

## 2023-02-24 NOTE — Progress Notes (Signed)
Office Visit Note   Patient: Jasmine Howard           Date of Birth: 01-Dec-1975           MRN: [id number] Visit Date: 02/24/2023              Requested by: Georgina Quint, MD [address] [place],  [place] [address] PCP: Georgina Quint, MD  No chief complaint on file.     HPI: Patient is a pleasant 48 year old woman who returns to follow-up on her right-sided low back pain.  She reports she is doing much better she is no longer using a cane.  She has been taking meloxicam.  She did try and do physical therapy fortunately is not covered by her insurance.  Assessment & Plan: Visit Diagnoses: Low back pain  Plan: She has no neurological findings she is actually doing much better.  Slight reproduction of symptoms with straight leg raise but she her strength is good her sensation is intact I did give her a handout on some low back exercises and stretches that I think she can do on her own May follow-up with me as needed  Follow-Up Instructions: No follow-ups on file.   Ortho Exam  Patient is alert, oriented, no adenopathy, well-dressed, normal affect, normal respiratory effort. Examination she appears without any difficulty ambulating she has good strength with resisted plantarflexion dorsiflexion extension flexion of her legs sensation is intact.  No tenderness over the lower back.  Slight reproduction of symptoms with straight leg raise on the right compartments are all soft nontender negative Denna Haggard' sign  Imaging: No results found. No images are attached to the encounter.  Labs: Lab Results  Component Value Date   HGBA1C 5.7 03/17/2022   HGBA1C 5.7 10/06/2021   HGBA1C 5.8 04/23/2021   ESRSEDRATE 21 (H) 07/08/2022   ESRSEDRATE 25 (H) 03/17/2022   ESRSEDRATE 22 01/10/2015   CRP <1.0 07/08/2022   REPTSTATUS 03/16/2012 FINAL 03/15/2012   CULT NO GROWTH 03/15/2012   LABORGA ESCHERICHIA COLI 11/20/2012     Lab Results  Component Value Date   ALBUMIN  4.3 07/08/2022   ALBUMIN 4.4 03/17/2022   ALBUMIN 4.3 10/06/2021    No results found for: "MG" No results found for: "VD25OH"  No results found for: "PREALBUMIN"    Latest Ref Rng & Units 07/08/2022    4:21 PM 03/17/2022   10:07 AM 10/06/2021    2:18 PM  CBC EXTENDED  WBC 4.0 - 10.5 K/uL 7.6  6.2  8.3   RBC 3.87 - 5.11 Mil/uL 4.67  4.62  4.67   Hemoglobin 12.0 - 15.0 g/dL 28.4  13.2  44.0   HCT 36.0 - 46.0 % 40.0  39.5  39.6   Platelets 150.0 - 400.0 K/uL 206.0  204.0  211.0   NEUT# 1.4 - 7.7 K/uL 4.9  3.7  5.6   Lymph# 0.7 - 4.0 K/uL 1.9  1.8  1.9      There is no height or weight on file to calculate BMI.  Orders:  No orders of the defined types were placed in this encounter.  No orders of the defined types were placed in this encounter.    Procedures: No procedures performed  Clinical Data: No additional findings.  ROS:  All other systems negative, except as noted in the HPI. Review of Systems  Objective: Vital Signs: There were no vitals taken for this visit.  Specialty Comments:  No specialty comments available.  PMFS History: Patient Active Problem List   Diagnosis Date Noted   Low back pain 01/05/2023   Right hip pain 01/05/2023   Perimenopausal 07/19/2022   Hives 07/08/2022   Rash of face 07/08/2022   History of osteoarthritis 03/17/2022   Rheumatism 03/17/2022   Hypertension associated with diabetes (HCC) 01/22/2021   Hypothyroidism 12/10/2009   Type 2 diabetes mellitus with hyperglycemia, without long-term current use of insulin (HCC) 12/10/2009   Irritable bowel syndrome 03/31/2009   Hepatic steatosis 03/31/2009   Past Medical History:  Diagnosis Date   Allergy    seasonal   ANEMIA-NOS    Arthritis    left arm   BENIGN POSITIONAL VERTIGO    Cervical incompetence    s/p cerclage 03/01/12   DEPRESSION    DIABETES MELLITUS, TYPE II    metformin   FATTY LIVER DISEASE    Hypertension    HYPOTHYROIDISM    Infertility, female    IRITIS     Irritable bowel syndrome    Polycystic ovaries    Postpartum care following cesarean delivery (breech 11/16) 12/12/2010    Family History  Problem Relation Age of Onset   Stomach cancer Maternal Aunt    Colon cancer Maternal Grandmother    Colon cancer Maternal Grandfather    Arthritis Other        Parent   Alcohol abuse Other        Parent   Colon polyps Neg Hx    Crohn's disease Neg Hx    Esophageal cancer Neg Hx    Rectal cancer Neg Hx    Ulcerative colitis Neg Hx     Past Surgical History:  Procedure Laterality Date   CERVICAL CERCLAGE N/A 03/03/2012   Procedure: CERCLAGE CERVICAL;  Surgeon: Tresa Endo A. Ernestina Penna, MD;  Location: [facility];  Service: Gynecology;  Laterality: N/A;  EDD:  09/07/12   CESAREAN SECTION  12/11/2010   Procedure: CESAREAN SECTION;  Surgeon: Tresa Endo A. Fogleman;  Location: [facility];  Service: Gynecology;  Laterality: N/A;   CESAREAN SECTION N/A 09/01/2012   Procedure: Repeat CESAREAN SECTION;  Surgeon: Tresa Endo A. Ernestina Penna, MD;  Location: [facility];  Service: Obstetrics;  Laterality: N/A;  EDD: 09/07/12   CHOLECYSTECTOMY  2009   SCAR REVISION  09/01/2012   Procedure: SCAR REVISION;  Surgeon: Tresa Endo A. Ernestina Penna, MD;  Location: [facility];  Service: Obstetrics;;  cesarean section scar revision   Social History   Occupational History   Not on file  Tobacco Use   Smoking status: Never   Smokeless tobacco: Never   Tobacco comments:    Married, lives with spouse and 2 kids. 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

## 2023-03-03 ENCOUNTER — Other Ambulatory Visit: Payer: Self-pay | Admitting: Emergency Medicine

## 2023-03-03 DIAGNOSIS — M25551 Pain in right hip: Secondary | ICD-10-CM

## 2023-03-03 DIAGNOSIS — M5440 Lumbago with sciatica, unspecified side: Secondary | ICD-10-CM

## 2023-04-06 ENCOUNTER — Other Ambulatory Visit: Payer: Self-pay | Admitting: Emergency Medicine

## 2023-04-06 DIAGNOSIS — M5440 Lumbago with sciatica, unspecified side: Secondary | ICD-10-CM

## 2023-04-06 DIAGNOSIS — M25551 Pain in right hip: Secondary | ICD-10-CM

## 2023-04-06 DIAGNOSIS — E1159 Type 2 diabetes mellitus with other circulatory complications: Secondary | ICD-10-CM

## 2023-04-26 NOTE — Progress Notes (Deleted)
 Office Visit Note  Patient: Jasmine Howard             Date of Birth: 19-Dec-1975           MRN: [id number]             PCP: Georgina Quint, MD Referring: Elenore Paddy, NP Visit Date: 04/27/2023 Occupation: @GUAROCC @  Subjective:  No chief complaint on file.   History of Present Illness: Jasmine Howard is a 48 y.o. female ***     Activities of Daily Living:  Patient reports morning stiffness for *** {minute/hour:19697}.   Patient {ACTIONS;DENIES/REPORTS:21021675::"Denies"} nocturnal pain.  Difficulty dressing/grooming: {ACTIONS;DENIES/REPORTS:21021675::"Denies"} Difficulty climbing stairs: {ACTIONS;DENIES/REPORTS:21021675::"Denies"} Difficulty getting out of chair: {ACTIONS;DENIES/REPORTS:21021675::"Denies"} Difficulty using hands for taps, buttons, cutlery, and/or writing: {ACTIONS;DENIES/REPORTS:21021675::"Denies"}  No Rheumatology ROS completed.   PMFS History:  Patient Active Problem List   Diagnosis Date Noted   Low back pain 01/05/2023   Right hip pain 01/05/2023   Perimenopausal 07/19/2022   Hives 07/08/2022   Rash of face 07/08/2022   History of osteoarthritis 03/17/2022   Rheumatism 03/17/2022   Hypertension associated with diabetes (HCC) 01/22/2021   Hypothyroidism 12/10/2009   Type 2 diabetes mellitus with hyperglycemia, without long-term current use of insulin (HCC) 12/10/2009   Irritable bowel syndrome 03/31/2009   Hepatic steatosis 03/31/2009    Past Medical History:  Diagnosis Date   Allergy    seasonal   ANEMIA-NOS    Arthritis    left arm   BENIGN POSITIONAL VERTIGO    Cervical incompetence    s/p cerclage 03/01/12   DEPRESSION    DIABETES MELLITUS, TYPE II    metformin   FATTY LIVER DISEASE    Hypertension    HYPOTHYROIDISM    Infertility, female    IRITIS    Irritable bowel syndrome    Polycystic ovaries    Postpartum care following cesarean delivery (breech 11/16) 12/12/2010    Family History  Problem Relation Age of  Onset   Stomach cancer Maternal Aunt    Colon cancer Maternal Grandmother    Colon cancer Maternal Grandfather    Arthritis Other        Parent   Alcohol abuse Other        Parent   Colon polyps Neg Hx    Crohn's disease Neg Hx    Esophageal cancer Neg Hx    Rectal cancer Neg Hx    Ulcerative colitis Neg Hx    Past Surgical History:  Procedure Laterality Date   CERVICAL CERCLAGE N/A 03/03/2012   Procedure: CERCLAGE CERVICAL;  Surgeon: Tresa Endo A. Ernestina Penna, MD;  Location: [facility];  Service: Gynecology;  Laterality: N/A;  EDD:  09/07/12   CESAREAN SECTION  12/11/2010   Procedure: CESAREAN SECTION;  Surgeon: Tresa Endo A. Fogleman;  Location: [facility];  Service: Gynecology;  Laterality: N/A;   CESAREAN SECTION N/A 09/01/2012   Procedure: Repeat CESAREAN SECTION;  Surgeon: Tresa Endo A. Ernestina Penna, MD;  Location: [facility];  Service: Obstetrics;  Laterality: N/A;  EDD: 09/07/12   CHOLECYSTECTOMY  2009   SCAR REVISION  09/01/2012   Procedure: SCAR REVISION;  Surgeon: Tresa Endo A. Ernestina Penna, MD;  Location: [facility];  Service: Obstetrics;;  cesarean section scar revision   Social History   Social History Narrative   Not on file   Immunization History  Administered Date(s) Administered   Influenza Split 12/30/2011   Influenza,inj,Quad PF,6+ Mos 01/22/2021, 10/06/2021   Td 04/25/2008   Tdap 10/06/2021     Objective:  Vital Signs: There were no vitals taken for this visit.   Physical Exam   Musculoskeletal Exam: ***  CDAI Exam: CDAI Score: -- Patient Global: --; Provider Global: -- Swollen: --; Tender: -- Joint Exam 04/27/2023   No joint exam has been documented for this visit   There is currently no information documented on the homunculus. Go to the Rheumatology activity and complete the homunculus joint exam.  Investigation: No additional findings.  Imaging: No results found.  Recent Labs: Lab Results  Component Value Date   WBC 7.6 07/08/2022   HGB 13.6 07/08/2022   PLT 206.0 07/08/2022   NA  138 07/08/2022   K 3.6 07/08/2022   CL 100 07/08/2022   CO2 31 07/08/2022   GLUCOSE 121 (H) 07/08/2022   BUN 10 07/08/2022   CREATININE 0.69 07/08/2022   BILITOT 0.5 07/08/2022   ALKPHOS 62 07/08/2022   AST 29 07/08/2022   ALT 43 (H) 07/08/2022   PROT 7.8 07/08/2022   ALBUMIN 4.3 07/08/2022   CALCIUM 9.5 07/08/2022   GFRAA 129 08/17/2016    Speciality Comments: No specialty comments available.  Procedures:  No procedures performed Allergies: Patient has no known allergies.   Assessment / Plan:     Visit Diagnoses: No diagnosis found.  Orders: No orders of the defined types were placed in this encounter.  No orders of the defined types were placed in this encounter.   Face-to-face time spent with patient was *** minutes. Greater than 50% of time was spent in counseling and coordination of care.  Follow-Up Instructions: No follow-ups on file.   Fuller Plan, MD  Note - This record has been created using [employer].  Chart creation errors have been sought, but may not always  have been located. Such creation errors do not reflect on  the standard of medical care.

## 2023-04-27 ENCOUNTER — Encounter: Payer: Commercial Managed Care - PPO | Admitting: Internal Medicine

## 2023-04-30 ENCOUNTER — Other Ambulatory Visit: Payer: Self-pay | Admitting: Emergency Medicine

## 2023-04-30 DIAGNOSIS — L509 Urticaria, unspecified: Secondary | ICD-10-CM

## 2023-04-30 DIAGNOSIS — R21 Rash and other nonspecific skin eruption: Secondary | ICD-10-CM

## 2023-05-05 ENCOUNTER — Other Ambulatory Visit: Payer: Self-pay | Admitting: Emergency Medicine

## 2023-05-05 DIAGNOSIS — M5440 Lumbago with sciatica, unspecified side: Secondary | ICD-10-CM

## 2023-05-05 DIAGNOSIS — M25551 Pain in right hip: Secondary | ICD-10-CM

## 2023-07-03 ENCOUNTER — Other Ambulatory Visit: Payer: Self-pay | Admitting: Emergency Medicine

## 2023-07-03 DIAGNOSIS — E039 Hypothyroidism, unspecified: Secondary | ICD-10-CM

## 2023-08-24 ENCOUNTER — Ambulatory Visit: Payer: Self-pay

## 2023-08-24 NOTE — Telephone Encounter (Signed)
 FYI Only or Action Required?: FYI only for provider.  Patient was last seen in primary care on 01/05/2023 by Jasmine Emil Schanz, MD.  Called Nurse Triage reporting Rash.  Symptoms began several weeks ago.  Interventions attempted: Nothing.  Symptoms are: unchanged.  Triage Disposition: See Physician Within 24 Hours  Patient/caregiver understands and will follow disposition?: Yes   Copied from CRM [id number]. Topic: Clinical - Red Word Triage >> Aug 24, 2023  1:14 PM Jasmine Howard wrote: Red Word that prompted transfer to Nurse Triage: Developing rash, unable to sleep .Jasmine Howard Believes it's getting worse Answer Assessment - Initial Assessment Questions 1. APPEARANCE of RASH: What does the rash look like? (e.g., blisters, dry flaky skin, red spots, redness, sores)     Small red bumps 2. SIZE: How big are the spots? (e.g., tip of pen, eraser, coin; inches, centimeters)     tiny 3. LOCATION: Where is the rash located?     everywhere 4. COLOR: What color is the rash? (Note: It is difficult to assess rash color in people with darker-colored skin. When this situation occurs, simply ask the caller to describe what they see.)     red 5. ONSET: When did the rash begin?     Two weeks ago 6. FEVER: Do you have a fever? If Yes, ask: What is your temperature, how was it measured, and when did it start?     no 7. ITCHING: Does the rash itch? If Yes, ask: How bad is the itch? (Scale 1-10; or mild, moderate, severe)     severe 8. CAUSE: What do you think is causing the rash?     unknown 9. MEDICINE FACTORS: Have you started any new medicines within the last 2 weeks? (e.g., antibiotics)      no 10. OTHER SYMPTOMS: Do you have any other symptoms? (e.g., dizziness, headache, sore throat, joint pain)       no 11. PREGNANCY: Is there any chance you are pregnant? When was your last menstrual period?       na  Protocols used: Rash or Redness - Widespread-A-AH  Reason for  Disposition  SEVERE itching (i.e., interferes with sleep, normal activities or school)  Answer Assessment - Initial Assessment Questions 1. APPEARANCE of RASH: What does the rash look like? (e.g., blisters, dry flaky skin, red spots, redness, sores)     Small red bumps 2. SIZE: How big are the spots? (e.g., tip of pen, eraser, coin; inches, centimeters)     tiny 3. LOCATION: Where is the rash located?     everywhere 4. COLOR: What color is the rash? (Note: It is difficult to assess rash color in people with darker-colored skin. When this situation occurs, simply ask the caller to describe what they see.)     red 5. ONSET: When did the rash begin?     Two weeks ago 6. FEVER: Do you have a fever? If Yes, ask: What is your temperature, how was it measured, and when did it start?     no 7. ITCHING: Does the rash itch? If Yes, ask: How bad is the itch? (Scale 1-10; or mild, moderate, severe)     severe 8. CAUSE: What do you think is causing the rash?     unknown 9. MEDICINE FACTORS: Have you started any new medicines within the last 2 weeks? (e.g., antibiotics)      no 10. OTHER SYMPTOMS: Do you have any other symptoms? (e.g., dizziness, headache, sore throat, joint pain)  no 11. PREGNANCY: Is there any chance you are pregnant? When was your last menstrual period?       na  Protocols used: Rash or Redness - [facility]

## 2023-08-25 ENCOUNTER — Encounter: Payer: Self-pay | Admitting: Emergency Medicine

## 2023-08-25 ENCOUNTER — Ambulatory Visit (INDEPENDENT_AMBULATORY_CARE_PROVIDER_SITE_OTHER): Payer: Self-pay | Admitting: Emergency Medicine

## 2023-08-25 VITALS — BP 124/80 | HR 102 | Temp 97.8°F | Ht 62.0 in | Wt 221.4 lb

## 2023-08-25 DIAGNOSIS — L239 Allergic contact dermatitis, unspecified cause: Secondary | ICD-10-CM | POA: Insufficient documentation

## 2023-08-25 MED ORDER — METHYLPREDNISOLONE 4 MG PO TBPK: ORAL_TABLET | ORAL | 1 refills | Status: AC

## 2023-08-25 NOTE — Patient Instructions (Signed)
Dermatitis de contacto Contact Dermatitis Se habla de dermatitis cuando la piel se enrojece, se hincha y duele.  La dermatitis de contacto ocurre cuando el cuerpo reacciona a algo que toca la piel. Hay dos tipos: Dermatitis de contacto irritativa. En [place], algo [employer], [place]. Dermatitis de contacto alrgica. En este caso, la piel toca algo a lo que la persona es Counselling psychologist, como la hiedra venenosa. Cules son las causas? La dermatitis de contacto irritativa puede ser causada por: [place]. Jabones. Detergentes. [place]and[place]. cidos. Metales, como el nquel. La dermatitis de contacto alrgica puede ser causada por: Plantas. Productos qumicos. Alhajas. Ltex. Medicamentos. Conservantes. Son los componentes que se agregan a los productos para que duren ms tiempo en buen [place]. Puede haber conservantes en su ropa. Qu incrementa el riesgo? Tener un trabajo en el que debe estar cerca de cosas que le molestan a la piel. Tener asma o eczema. Cules son los signos o sntomas?  Piel seca o descamada. Enrojecimiento. Grietas. Picazn. Los sntomas moderados de esta afeccin incluyen lo siguiente: Dolor o sensacin de ardor. Ampollas. Sangre o lquido transparente que sale de las grietas de la piel. Hinchazn. Puede presentarse en los prpados, la boca o los genitales. Cmo se trata? El mdico averiguar qu hace que su piel reaccione. De esta forma, podr protegerse la piel. Es posible que deba usar: Ungentos, medicamentos o cremas con corticoesteroides. Antibiticos u otros ungentos, si tiene una infeccin en la piel. Lociones o medicamentos para Associate Professor. Una venda. Siga estas indicaciones en su casa: Cuidado de la piel Pngase crema humectante en la piel cuando lo necesite. Pngase paos frescos y hmedos (compresas fras) en la piel. Pngase una pasta de bicarbonato de [employer]. Agregue agua al bicarbonato de sodio hasta que  parezca una pasta. No se rasque la piel. Trate de que nada se frote contra su piel. Evite la ropa [place]. Evite el uso de [place], perfumes y tintes. Revsese la [employer] para detectar signos de infeccin. Est atento a los siguientes signos: Aumento del enrojecimiento, la hinchazn o Chief Technology Officer. Ms lquido [place]. Calor. Pus o mal olor. Medicamentos [place], use o aplique los medicamentos de venta libre y los recetados solamente como se lo haya indicado el mdico. Si le recetaron antibiticos, tmelos o aplqueselos como se lo haya indicado el mdico. No deje de usarlos aunque comience a sentirse mejor. Baos [place] un bao con: Sales de [place]. Bicarbonato de sodio. Avena coloidal. Bese con menos frecuencia. Bese con agua tibia. Trate de no usar agua caliente. Cuidado de la venda Si le colocaron una venda, [place] segn se lo haya indicado el mdico. Lvese las manos con agua y jabn durante al menos 20 segundos antes y despus de cambiarse la venda. Use un desinfectante para manos si no dispone de [place] y [place]. Indicaciones generales Evite las cosas que le causaron la reaccin. Si no sabe qu la caus, lleve un diario. Escriba los siguientes datos: Lo que come. Los productos para la piel que [place]. Lo que bebe. La ropa que [place]. Comunquese con un mdico si: No mejora con el tratamiento. Empeora. Tiene signos de infeccin. Tiene fiebre. Aparecen nuevos sntomas. El hueso o la articulacin que se encuentran cerca de la zona le duelen despus de que la piel se [place]. Solicite ayuda de inmediato si: Nota unas lneas rojas en la piel que salen de la zona. La zona se oscurece. Tiene dificultad para respirar. Esta informacin no  tiene Theme park manager el consejo del mdico. Asegrese de hacerle al mdico cualquier pregunta que tenga. Document Revised: 08/04/2021 Document Reviewed: 08/04/2021 Elsevier Patient Education  2024 [employer].

## 2023-08-25 NOTE — Progress Notes (Signed)
 Jasmine Howard 47 y.o.   Chief Complaint  Patient presents with   Rash    Started a few weeks ago. Rash on entire body. States she changed soap a few months ago and unsure if that has anything to do with it.     HISTORY OF PRESENT ILLNESS: Acute problem visit today. [name] complaining of itchy rash that started about a month ago Unknown trigger. No other associated symptoms Has been taking hydroxyzine  at bedtime to help her sleep and deal with itching No other complaints or medical concerns today.  Rash Pertinent negatives include no congestion, cough, diarrhea, fever, shortness of breath, sore throat or vomiting.     Prior to Admission medications   Medication Sig Start Date End Date Taking? Authorizing Provider  Acetaminophen  (TYLENOL  PO) Take by mouth as needed.   Yes [provider]  hydrochlorothiazide  (HYDRODIURIL ) 12.5 MG tablet TAKE 1 TABLET BY MOUTH EVERY DAY 12/26/22  Yes Duwan Adrian, Emil Schanz, MD  levothyroxine  (SYNTHROID ) 50 MCG tablet TAKE 1 TABLET BY MOUTH EVERY DAY BEFORE BREAKFAST 07/03/23  Yes Maria Gallicchio Jose, MD  metFORMIN  (GLUCOPHAGE -XR) 500 MG 24 hr tablet TAKE 1 TABLET BY MOUTH TWICE A DAY WITH FOOD 11/18/22  Yes Shenaya Lebo, Emil Schanz, MD  simvastatin  (ZOCOR ) 20 MG tablet TAKE 1 TABLET BY MOUTH EVERYDAY AT BEDTIME 04/06/23  Yes Williams Dietrick, Emil Schanz, MD  hydrOXYzine  (VISTARIL ) 25 MG capsule TAKE 1 CAPSULE (25 MG TOTAL) BY MOUTH AT BEDTIME AS NEEDED FOR ITCHING. Patient not taking: Reported on 08/25/2023 05/01/23   Purcell Emil Schanz, MD  IBUPROFEN  PO Take by mouth as needed. Patient not taking: Reported on 08/25/2023    [provider]  levonorgestrel (MIRENA) 20 MCG/24HR IUD 1 Intra Uterine Device (1 each total) by Intrauterine route once. 10/25/12   Inocencio Berwyn LABOR, MD  meloxicam  (MOBIC ) 15 MG tablet TAKE 1 TABLET (15 MG TOTAL) BY MOUTH DAILY. Patient not taking: Reported on 08/25/2023 05/05/23   Purcell Emil Schanz,  MD  methylPREDNISolone  (MEDROL  DOSEPAK) 4 MG TBPK tablet Take as directed with food Patient not taking: Reported on 08/25/2023 01/12/23   Persons, Ronal Dragon, [place]    No Known Allergies  Patient Active Problem List   Diagnosis Date Noted   Low back pain 01/05/2023   Right hip pain 01/05/2023   Perimenopausal 07/19/2022   Hives 07/08/2022   Rash of face 07/08/2022   History of osteoarthritis 03/17/2022   Rheumatism 03/17/2022   Hypertension associated with diabetes (HCC) 01/22/2021   Hypothyroidism 12/10/2009   Type 2 diabetes mellitus with hyperglycemia, without long-term current use of insulin (HCC) 12/10/2009   Irritable bowel syndrome 03/31/2009   Hepatic steatosis 03/31/2009    Past Medical History:  Diagnosis Date   Allergy    seasonal   ANEMIA-NOS    Arthritis    left arm   BENIGN POSITIONAL VERTIGO    Cervical incompetence    s/p cerclage 03/01/12   DEPRESSION    DIABETES MELLITUS, TYPE II    metformin    FATTY LIVER DISEASE    Hypertension    HYPOTHYROIDISM    Infertility, female    IRITIS    Irritable bowel syndrome    Polycystic ovaries    Postpartum care following cesarean delivery (breech 11/16) 12/12/2010    Past Surgical History:  Procedure Laterality Date   CERVICAL CERCLAGE N/A 03/03/2012   Procedure: CERCLAGE CERVICAL;  Surgeon: Burnard A. Kandyce, MD;  Location: [facility];  Service: Gynecology;  Laterality: N/A;  EDD:  09/07/12   CESAREAN SECTION  12/11/2010   Procedure: CESAREAN SECTION;  Surgeon: Burnard A. Fogleman;  Location: [facility];  Service: Gynecology;  Laterality: N/A;   CESAREAN SECTION N/A 09/01/2012   Procedure: Repeat CESAREAN SECTION;  Surgeon: Burnard A. Kandyce, MD;  Location: [facility];  Service: Obstetrics;  Laterality: N/A;  EDD: 09/07/12   CHOLECYSTECTOMY  2009   SCAR REVISION  09/01/2012   Procedure: SCAR REVISION;  Surgeon: Burnard A. Kandyce, MD;  Location: [facility];  Service: Obstetrics;;  cesarean section scar revision    Social History    Socioeconomic History   Marital status: Married    Spouse name: Not on file   Number of children: Not on file   Years of education: Not on file   Highest education level: GED or equivalent  Occupational History   Not on file  Tobacco Use   Smoking status: Never   Smokeless tobacco: Never   Tobacco comments:    Married, lives with spouse and 2 kids. 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  Substance and Sexual Activity   Alcohol use: No   Drug use: No   Sexual activity: Never  Other Topics Concern   Not on file  Social History Narrative   Not on file   Social Drivers of Health   Financial Resource Strain: Low Risk  (07/16/2022)   Overall Financial Resource Strain (CARDIA)    Difficulty of Paying Living Expenses: Not hard at all  Food Insecurity: Unknown (07/16/2022)   Hunger Vital Sign    Worried About Running Out of Food in the Last Year: Not on file    Ran Out of Food in the Last Year: Never true  Transportation Needs: No Transportation Needs (07/16/2022)   PRAPARE - Administrator, Civil Service (Medical): No    Lack of Transportation (Non-Medical): No  Physical Activity: Sufficiently Active (07/16/2022)   Exercise Vital Sign    Days of Exercise per Week: 3 days    Minutes of Exercise per Session: 60 min  Stress: No Stress Concern Present (07/16/2022)   [employer] of Occupational Health - Occupational Stress Questionnaire    Feeling of Stress : Not at all  Social Connections: Socially Integrated (07/16/2022)   Social Connection and Isolation Panel    Frequency of Communication with Friends and Family: Three times a week    Frequency of Social Gatherings with Friends and Family: Once a week    Attends Religious Services: More than 4 times per year    Active Member of [employer] or Organizations: Yes    Attends Engineer, structural: More than 4 times per year    Marital Status: Married  Careers information officer Violence: Not on file    Family History  Problem Relation Age of Onset   Stomach cancer Maternal Aunt    Colon cancer Maternal Grandmother    Colon cancer Maternal Grandfather    Arthritis Other        Parent   Alcohol abuse Other        Parent   Colon polyps Neg Hx    Crohn's disease Neg Hx    Esophageal cancer Neg Hx    Rectal cancer Neg Hx    Ulcerative colitis Neg Hx      Review of Systems  Constitutional: Negative.  Negative for chills and fever.  HENT:  Negative for congestion and sore  throat.   Respiratory: Negative.  Negative for cough and shortness of breath.   Cardiovascular: Negative.  Negative for chest pain and palpitations.  Gastrointestinal:  Negative for abdominal pain, diarrhea, nausea and vomiting.  Genitourinary: Negative.  Negative for dysuria and hematuria.  Skin:  Positive for itching and rash.  Neurological:  Negative for dizziness and headaches.  All other systems reviewed and are negative.   Today's Vitals   08/25/23 1102  BP: 124/80  Pulse: (!) 102  Temp: 97.8 F (36.6 C)  TempSrc: Oral  SpO2: 95%  Weight: 221 lb 6.4 oz (100.4 kg)  Height: 5' 2 (1.575 m)   Body mass index is 40.49 kg/m.   Physical Exam Vitals reviewed.  Constitutional:      Appearance: Normal appearance.  HENT:     Head: Normocephalic.  Eyes:     Extraocular Movements: Extraocular movements intact.  Cardiovascular:     Rate and Rhythm: Normal rate.  Pulmonary:     Effort: Pulmonary effort is normal.  Skin:    General: Skin is warm and dry.     Findings: Rash present.     Comments: Maculopapular erythematous diffuse rash mostly trunk and extremities  Neurological:     Mental Status: She is alert and oriented to person, place, and time.  Psychiatric:        Mood and Affect: Mood normal.        Behavior: Behavior normal.      ASSESSMENT & PLAN: A total of 32 minutes was spent with the patient and counseling/coordination of care regarding  preparing for this visit, review of most recent office visit notes, review of all medications and any chronic medical conditions under management, diagnosis of allergic dermatitis and management, prognosis, documentation and need for follow-up.  Problem List Items Addressed This Visit       Musculoskeletal and Integument   Allergic dermatitis - Primary   Clinically stable.  No red flag signs or symptoms Diffuse rash on physical examination No findings of angioedema.  No signs of anaphylaxis. Unknown trigger. Differential diagnosis discussed Recommend to continue hydroxyzine  and start Medrol  Dosepak May need dermatology evaluation in the near future Advised to contact the office if no better or worse during the next several days or weeks.      Relevant Medications   methylPREDNISolone  (MEDROL  DOSEPAK) 4 MG TBPK tablet   Patient Instructions  Dermatitis de contacto Contact Dermatitis Se habla de dermatitis cuando la piel se enrojece, se hincha y duele.  La dermatitis de contacto ocurre cuando el cuerpo reacciona a algo que toca la piel. Hay dos tipos: Dermatitis de contacto irritativa. En [place], algo [employer], Olive. Dermatitis de contacto alrgica. En este caso, la piel toca algo a lo que la persona es Counselling psychologist, como la hiedra venenosa. Cules son las causas? La dermatitis de contacto irritativa puede ser causada por: Maquillaje. Jabones. Detergentes. Lavandina. cidos. Metales, como el nquel. La dermatitis de contacto alrgica puede ser causada por: Plantas. Productos qumicos. Alhajas. Ltex. Medicamentos. Conservantes. Son los componentes que se agregan a los productos para que duren ms tiempo en buen [place]. Puede haber conservantes en su ropa. Qu incrementa el riesgo? Tener un trabajo en el que debe estar cerca de cosas que le molestan a la piel. Tener asma o eczema. Cules son los signos o sntomas?  Piel seca o  descamada. Enrojecimiento. Grietas. Picazn. Los sntomas moderados de esta afeccin incluyen lo siguiente: Dolor o sensacin de ardor. Ampollas.  Sangre o lquido transparente que sale de las grietas de la piel. Hinchazn. Puede presentarse en los prpados, la boca o los genitales. Cmo se trata? El mdico averiguar qu hace que su piel reaccione. De esta forma, podr protegerse la piel. Es posible que deba usar: Ungentos, medicamentos o cremas con corticoesteroides. Antibiticos u otros ungentos, si tiene una infeccin en la piel. Lociones o medicamentos para aliviar la picazn. Una venda. Siga estas indicaciones en su casa: Cuidado de la piel Pngase crema humectante en la piel cuando lo necesite. Pngase paos frescos y hmedos (compresas fras) en la piel. Pngase una pasta de bicarbonato de [employer]. Agregue agua al bicarbonato de sodio hasta que parezca una pasta. No se rasque la piel. Trate de que nada se frote contra su piel. Evite la ropa [place]. Evite el uso de [place], perfumes y tintes. Revsese la eBay para detectar signos de infeccin. Est atento a los siguientes signos: Aumento del enrojecimiento, la hinchazn o Chief Technology Officer. Ms lquido malva bertrand. Calor. Pus o mal olor. Medicamentos [place], use o aplique los medicamentos de venta libre y los recetados solamente como se lo haya indicado el mdico. Si le recetaron antibiticos, tmelos o aplqueselos como se lo haya indicado el mdico. No deje de usarlos aunque comience a sentirse mejor. Baos Tome un bao con: Sales de [place]. Bicarbonato de sodio. Avena coloidal. Bese con menos frecuencia. Bese con agua tibia. Trate de no usar agua caliente. Cuidado de la venda Si le colocaron una venda, nepal segn se lo haya indicado el mdico. Lvese las manos con agua y jabn durante al menos 20 segundos antes y despus de cambiarse la venda. Use un desinfectante para manos si no dispone de  agua y jabn. Indicaciones generales Evite las cosas que le causaron la reaccin. Si no sabe qu la caus, lleve un diario. Escriba los siguientes datos: Lo que come. Los productos para la piel que usa . Lo que bebe. La ropa que usa . Comunquese con un mdico si: No mejora con el tratamiento. Empeora. Tiene signos de infeccin. Tiene fiebre. Aparecen nuevos sntomas. El hueso o la articulacin que se encuentran cerca de la zona le duelen despus de que la piel se [place]. Solicite ayuda de inmediato si: Nota unas lneas rojas en la piel que salen de la zona. La zona se oscurece. Tiene dificultad para respirar. Esta informacin no tiene Theme park manager el consejo del mdico. Asegrese de hacerle al mdico cualquier pregunta que tenga. Document Revised: 08/04/2021 Document Reviewed: 08/04/2021 Elsevier Patient Education  2024 Elsevier Inc.    Emil Schaumann, MD [facility]

## 2023-08-25 NOTE — Assessment & Plan Note (Signed)
 Clinically stable.  No red flag signs or symptoms Diffuse rash on physical examination No findings of angioedema.  No signs of anaphylaxis. Unknown trigger. Differential diagnosis discussed Recommend to continue hydroxyzine  and start Medrol  Dosepak May need dermatology evaluation in the near future Advised to contact the office if no better or worse during the next several days or weeks.

## 2023-09-05 ENCOUNTER — Ambulatory Visit: Payer: Self-pay

## 2023-09-05 NOTE — Progress Notes (Signed)
   Acute Office Visit  Subjective:     Patient ID: Jasmine Howard, female    DOB: 1975-12-16, 48 y.o.   MRN: [id number]  No chief complaint on file.   HPI  Discussed the use of AI scribe software for clinical note transcription with the patient, who gave verbal consent to proceed.  History of Present Illness      ROS Per HPI      Objective:    There were no vitals taken for this visit.   Physical Exam Vitals and nursing note reviewed.  Constitutional:      General: She is not in acute distress.    Appearance: Normal appearance. She is normal weight.  HENT:     Head: Normocephalic and atraumatic.     Right Ear: External ear normal.     Left Ear: External ear normal.     Nose: Nose normal.     Mouth/Throat:     Mouth: Mucous membranes are moist.     Pharynx: Oropharynx is clear.  Eyes:     Extraocular Movements: Extraocular movements intact.     Pupils: Pupils are equal, round, and reactive to light.  Cardiovascular:     Rate and Rhythm: Normal rate and regular rhythm.     Pulses: Normal pulses.     Heart sounds: Normal heart sounds.  Pulmonary:     Effort: Pulmonary effort is normal. No respiratory distress.     Breath sounds: Normal breath sounds. No wheezing, rhonchi or rales.  Musculoskeletal:        General: Normal range of motion.     Cervical back: Normal range of motion.     Right lower leg: No edema.     Left lower leg: No edema.  Lymphadenopathy:     Cervical: No cervical adenopathy.  Neurological:     General: No focal deficit present.     Mental Status: She is alert and oriented to person, place, and time.  Psychiatric:        Mood and Affect: Mood normal.        Thought Content: Thought content normal.     No results found for any visits on 09/06/23.      Assessment & Plan:   Assessment and Plan Assessment & Plan      No orders of the defined types were placed in this encounter.    No orders of the defined types were  placed in this encounter.   No follow-ups on file.  Corean LITTIE Ku, FNP

## 2023-09-05 NOTE — Telephone Encounter (Signed)
 FYI Only or Action Required?: FYI only for provider.  Patient was last seen in primary care on 08/25/2023 by Purcell Emil Schanz, MD.  Called Nurse Triage reporting Rash.  Symptoms began a week ago.  Interventions attempted: Rest, hydration, or home remedies. And cream rx'ed by PCP last week.  Symptoms are: unchanged.  Triage Disposition: See Physician Within 24 Hours  Patient/caregiver understands and will follow disposition?: Yes, will follow disposition   Pt previously triaged for a rash that prevented her some sleeping.She came in on the 31st and was prescribed a cream. However the pt does not feel as if its working. Would like to come back in for an appointment. Decision tree denial  Reason for Disposition  SEVERE itching (i.e., interferes with sleep, normal activities or school)  Answer Assessment - Initial Assessment Questions 1. APPEARANCE of RASH: What does the rash look like? (e.g., blisters, dry flaky skin, red spots, redness, sores)     Redness to ABD, underarms, breast 4. COLOR: What color is the rash? (Note: It is difficult to assess rash color in people with darker-colored skin. When this situation occurs, simply ask the caller to describe what they see.)     red 5. ONSET: When did the rash begin?     For a week 6. FEVER: Do you have a fever? If Yes, ask: What is your temperature, how was it measured, and when did it start?     denies 7. ITCHING: Does the rash itch? If Yes, ask: How bad is the itch? (Scale 1-10; or mild, moderate, severe)     Severe itching 8. CAUSE: What do you think is causing the rash?     unsure 9. MEDICINE FACTORS: Have you started any new medicines within the last 2 weeks? (e.g., antibiotics)      denies 10. OTHER SYMPTOMS: Do you have any other symptoms? (e.g., dizziness, headache, sore throat, joint pain)       denies 11. PREGNANCY: Is there any chance you are pregnant? When was your last menstrual period?        denies  Protocols used: Rash or Redness - [facility]

## 2023-09-06 ENCOUNTER — Ambulatory Visit: Admitting: Family Medicine

## 2023-09-06 ENCOUNTER — Ambulatory Visit: Payer: Self-pay | Admitting: Family Medicine

## 2023-09-06 ENCOUNTER — Encounter: Payer: Self-pay | Admitting: Family Medicine

## 2023-09-06 VITALS — BP 120/78 | HR 71 | Temp 97.9°F | Ht 62.0 in | Wt 222.4 lb

## 2023-09-06 DIAGNOSIS — R21 Rash and other nonspecific skin eruption: Secondary | ICD-10-CM

## 2023-09-06 DIAGNOSIS — K760 Fatty (change of) liver, not elsewhere classified: Secondary | ICD-10-CM

## 2023-09-06 DIAGNOSIS — E039 Hypothyroidism, unspecified: Secondary | ICD-10-CM | POA: Diagnosis not present

## 2023-09-06 DIAGNOSIS — E1165 Type 2 diabetes mellitus with hyperglycemia: Secondary | ICD-10-CM

## 2023-09-06 LAB — CBC WITH DIFFERENTIAL/PLATELET
Basophils Absolute: 0 K/uL (ref 0.0–0.1)
Basophils Relative: 0.4 % (ref 0.0–3.0)
Eosinophils Absolute: 0.5 K/uL (ref 0.0–0.7)
Eosinophils Relative: 6.5 % — ABNORMAL HIGH (ref 0.0–5.0)
HCT: 41.8 % (ref 36.0–46.0)
Hemoglobin: 14 g/dL (ref 12.0–15.0)
Lymphocytes Relative: 18 % (ref 12.0–46.0)
Lymphs Abs: 1.3 K/uL (ref 0.7–4.0)
MCHC: 33.5 g/dL (ref 30.0–36.0)
MCV: 86.1 fl (ref 78.0–100.0)
Monocytes Absolute: 0.6 K/uL (ref 0.1–1.0)
Monocytes Relative: 8.4 % (ref 3.0–12.0)
Neutro Abs: 5 K/uL (ref 1.4–7.7)
Neutrophils Relative %: 66.7 % (ref 43.0–77.0)
Platelets: 185 K/uL (ref 150.0–400.0)
RBC: 4.86 Mil/uL (ref 3.87–5.11)
RDW: 13.9 % (ref 11.5–15.5)
WBC: 7.4 K/uL (ref 4.0–10.5)

## 2023-09-06 LAB — COMPREHENSIVE METABOLIC PANEL WITH GFR
ALT: 93 U/L — ABNORMAL HIGH (ref 0–35)
AST: 63 U/L — ABNORMAL HIGH (ref 0–37)
Albumin: 4.4 g/dL (ref 3.5–5.2)
Alkaline Phosphatase: 87 U/L (ref 39–117)
BUN: 9 mg/dL (ref 6–23)
CO2: 27 meq/L (ref 19–32)
Calcium: 9.2 mg/dL (ref 8.4–10.5)
Chloride: 101 meq/L (ref 96–112)
Creatinine, Ser: 0.57 mg/dL (ref 0.40–1.20)
GFR: 106.86 mL/min (ref >60.00)
Glucose, Bld: 122 mg/dL — ABNORMAL HIGH (ref 70–99)
Potassium: 3.8 meq/L (ref 3.5–5.1)
Sodium: 136 meq/L (ref 135–145)
Total Bilirubin: 0.5 mg/dL (ref 0.2–1.2)
Total Protein: 7.8 g/dL (ref 6.0–8.3)

## 2023-09-06 LAB — SEDIMENTATION RATE: Sed Rate: 37 mm/h — ABNORMAL HIGH (ref 0–20)

## 2023-09-06 LAB — C-REACTIVE PROTEIN: CRP: 1.2 mg/dL (ref 0.5–20.0)

## 2023-09-06 LAB — TSH: TSH: 5.49 u[IU]/mL (ref 0.35–5.50)

## 2023-09-06 MED ORDER — PREDNISONE 20 MG PO TABS: 40.0000 mg | ORAL_TABLET | Freq: Every day | ORAL | 0 refills | Status: AC

## 2023-09-06 MED ORDER — CLOBETASOL PROPIONATE 0.05 % EX OINT: 1.0000 | TOPICAL_OINTMENT | Freq: Two times a day (BID) | CUTANEOUS | 0 refills | Status: AC

## 2023-09-06 NOTE — Patient Instructions (Addendum)
 I have sent in prednisone  for you to take 2 tablets once daily in the morning with breakfast for the next 5 days.  We are checking labs today, will be in contact with any results that require further attention.

## 2023-10-17 ENCOUNTER — Other Ambulatory Visit: Payer: Self-pay | Admitting: Emergency Medicine

## 2023-10-17 DIAGNOSIS — E1165 Type 2 diabetes mellitus with hyperglycemia: Secondary | ICD-10-CM

## 2023-11-28 ENCOUNTER — Encounter: Payer: Self-pay | Admitting: Radiology

## 2023-12-14 ENCOUNTER — Other Ambulatory Visit: Payer: Self-pay | Admitting: Emergency Medicine

## 2023-12-14 DIAGNOSIS — E039 Hypothyroidism, unspecified: Secondary | ICD-10-CM

## 2023-12-14 DIAGNOSIS — I10 Essential (primary) hypertension: Secondary | ICD-10-CM

## 2023-12-14 DIAGNOSIS — R609 Edema, unspecified: Secondary | ICD-10-CM

## 2023-12-30 LAB — OPHTHALMOLOGY REPORT-SCANNED
